# Patient Record
Sex: Female | Born: 1935 | Race: White | Hispanic: No | Marital: Married | Smoking: Never smoker
Health system: Southern US, Community
[De-identification: ages and names within clinical notes are randomized; demographics above are authoritative.]

## PROBLEM LIST (undated history)

## (undated) DIAGNOSIS — I499 Cardiac arrhythmia, unspecified: Secondary | ICD-10-CM

## (undated) DIAGNOSIS — E119 Type 2 diabetes mellitus without complications: Secondary | ICD-10-CM

## (undated) DIAGNOSIS — Z8719 Personal history of other diseases of the digestive system: Secondary | ICD-10-CM

## (undated) DIAGNOSIS — I10 Essential (primary) hypertension: Secondary | ICD-10-CM

## (undated) DIAGNOSIS — J42 Unspecified chronic bronchitis: Secondary | ICD-10-CM

## (undated) DIAGNOSIS — I480 Paroxysmal atrial fibrillation: Secondary | ICD-10-CM

## (undated) DIAGNOSIS — E785 Hyperlipidemia, unspecified: Secondary | ICD-10-CM

## (undated) DIAGNOSIS — K219 Gastro-esophageal reflux disease without esophagitis: Secondary | ICD-10-CM

## (undated) DIAGNOSIS — M810 Age-related osteoporosis without current pathological fracture: Secondary | ICD-10-CM

## (undated) DIAGNOSIS — M1990 Unspecified osteoarthritis, unspecified site: Secondary | ICD-10-CM

## (undated) DIAGNOSIS — N183 Chronic kidney disease, stage 3 unspecified: Secondary | ICD-10-CM

## (undated) DIAGNOSIS — I509 Heart failure, unspecified: Secondary | ICD-10-CM

## (undated) DIAGNOSIS — C4491 Basal cell carcinoma of skin, unspecified: Secondary | ICD-10-CM

## (undated) DIAGNOSIS — T7840XA Allergy, unspecified, initial encounter: Secondary | ICD-10-CM

## (undated) HISTORY — DX: Age-related osteoporosis without current pathological fracture: M81.0

## (undated) HISTORY — PX: ABDOMINAL HYSTERECTOMY: SHX81

## (undated) HISTORY — PX: BASAL CELL CARCINOMA EXCISION: SHX1214

## (undated) HISTORY — PX: APPENDECTOMY: SHX54

## (undated) HISTORY — DX: Gastro-esophageal reflux disease without esophagitis: K21.9

## (undated) HISTORY — DX: Unspecified osteoarthritis, unspecified site: M19.90

## (undated) HISTORY — DX: Hyperlipidemia, unspecified: E78.5

## (undated) HISTORY — DX: Allergy, unspecified, initial encounter: T78.40XA

---

## 1999-02-17 ENCOUNTER — Inpatient Hospital Stay (HOSPITAL_COMMUNITY): Admission: AD | Admit: 1999-02-17 | Discharge: 1999-02-18 | Payer: Self-pay | Admitting: Interventional Cardiology

## 1999-02-18 ENCOUNTER — Encounter: Payer: Self-pay | Admitting: Interventional Cardiology

## 1999-03-09 ENCOUNTER — Ambulatory Visit (HOSPITAL_COMMUNITY): Admission: RE | Admit: 1999-03-09 | Discharge: 1999-03-09 | Payer: Self-pay | Admitting: Gastroenterology

## 1999-04-25 ENCOUNTER — Ambulatory Visit (HOSPITAL_COMMUNITY): Admission: RE | Admit: 1999-04-25 | Discharge: 1999-04-25 | Payer: Self-pay | Admitting: Gastroenterology

## 1999-04-25 ENCOUNTER — Encounter: Payer: Self-pay | Admitting: Gastroenterology

## 2000-07-09 ENCOUNTER — Inpatient Hospital Stay (HOSPITAL_COMMUNITY): Admission: EM | Admit: 2000-07-09 | Discharge: 2000-07-10 | Payer: Self-pay | Admitting: Emergency Medicine

## 2000-07-31 ENCOUNTER — Encounter: Payer: Self-pay | Admitting: *Deleted

## 2000-07-31 ENCOUNTER — Ambulatory Visit (HOSPITAL_COMMUNITY): Admission: RE | Admit: 2000-07-31 | Discharge: 2000-07-31 | Payer: Self-pay | Admitting: *Deleted

## 2001-06-05 ENCOUNTER — Encounter: Admission: RE | Admit: 2001-06-05 | Discharge: 2001-06-05 | Payer: Self-pay | Admitting: Emergency Medicine

## 2001-06-05 ENCOUNTER — Encounter: Payer: Self-pay | Admitting: Emergency Medicine

## 2001-11-25 ENCOUNTER — Encounter: Admission: RE | Admit: 2001-11-25 | Discharge: 2002-02-23 | Payer: Self-pay | Admitting: Family Medicine

## 2012-01-07 DIAGNOSIS — E119 Type 2 diabetes mellitus without complications: Secondary | ICD-10-CM | POA: Diagnosis not present

## 2012-01-11 DIAGNOSIS — D485 Neoplasm of uncertain behavior of skin: Secondary | ICD-10-CM | POA: Diagnosis not present

## 2012-01-11 DIAGNOSIS — D0439 Carcinoma in situ of skin of other parts of face: Secondary | ICD-10-CM | POA: Diagnosis not present

## 2012-02-01 DIAGNOSIS — J309 Allergic rhinitis, unspecified: Secondary | ICD-10-CM | POA: Diagnosis not present

## 2012-02-04 DIAGNOSIS — H01009 Unspecified blepharitis unspecified eye, unspecified eyelid: Secondary | ICD-10-CM | POA: Diagnosis not present

## 2012-02-04 DIAGNOSIS — H2510 Age-related nuclear cataract, unspecified eye: Secondary | ICD-10-CM | POA: Diagnosis not present

## 2012-02-04 DIAGNOSIS — E119 Type 2 diabetes mellitus without complications: Secondary | ICD-10-CM | POA: Diagnosis not present

## 2012-02-06 DIAGNOSIS — D0439 Carcinoma in situ of skin of other parts of face: Secondary | ICD-10-CM | POA: Diagnosis not present

## 2012-02-06 DIAGNOSIS — C44320 Squamous cell carcinoma of skin of unspecified parts of face: Secondary | ICD-10-CM | POA: Diagnosis not present

## 2012-02-26 DIAGNOSIS — N390 Urinary tract infection, site not specified: Secondary | ICD-10-CM | POA: Diagnosis not present

## 2012-02-26 DIAGNOSIS — M25569 Pain in unspecified knee: Secondary | ICD-10-CM | POA: Diagnosis not present

## 2012-03-14 DIAGNOSIS — E782 Mixed hyperlipidemia: Secondary | ICD-10-CM | POA: Diagnosis not present

## 2012-03-14 DIAGNOSIS — E118 Type 2 diabetes mellitus with unspecified complications: Secondary | ICD-10-CM | POA: Diagnosis not present

## 2012-05-06 DIAGNOSIS — E119 Type 2 diabetes mellitus without complications: Secondary | ICD-10-CM | POA: Diagnosis not present

## 2012-05-20 DIAGNOSIS — Z7189 Other specified counseling: Secondary | ICD-10-CM | POA: Diagnosis not present

## 2012-05-20 DIAGNOSIS — Z1331 Encounter for screening for depression: Secondary | ICD-10-CM | POA: Diagnosis not present

## 2012-05-20 DIAGNOSIS — M48062 Spinal stenosis, lumbar region with neurogenic claudication: Secondary | ICD-10-CM | POA: Diagnosis not present

## 2012-06-04 DIAGNOSIS — Z1231 Encounter for screening mammogram for malignant neoplasm of breast: Secondary | ICD-10-CM | POA: Diagnosis not present

## 2012-07-15 DIAGNOSIS — N390 Urinary tract infection, site not specified: Secondary | ICD-10-CM | POA: Diagnosis not present

## 2012-07-15 DIAGNOSIS — A499 Bacterial infection, unspecified: Secondary | ICD-10-CM | POA: Diagnosis not present

## 2012-07-28 DIAGNOSIS — L820 Inflamed seborrheic keratosis: Secondary | ICD-10-CM | POA: Diagnosis not present

## 2012-07-28 DIAGNOSIS — D485 Neoplasm of uncertain behavior of skin: Secondary | ICD-10-CM | POA: Diagnosis not present

## 2012-07-30 DIAGNOSIS — M79609 Pain in unspecified limb: Secondary | ICD-10-CM | POA: Diagnosis not present

## 2012-07-30 DIAGNOSIS — R209 Unspecified disturbances of skin sensation: Secondary | ICD-10-CM | POA: Diagnosis not present

## 2012-08-04 DIAGNOSIS — M5137 Other intervertebral disc degeneration, lumbosacral region: Secondary | ICD-10-CM | POA: Diagnosis not present

## 2012-08-04 DIAGNOSIS — M79609 Pain in unspecified limb: Secondary | ICD-10-CM | POA: Diagnosis not present

## 2012-08-04 DIAGNOSIS — IMO0002 Reserved for concepts with insufficient information to code with codable children: Secondary | ICD-10-CM | POA: Diagnosis not present

## 2012-08-04 DIAGNOSIS — R209 Unspecified disturbances of skin sensation: Secondary | ICD-10-CM | POA: Diagnosis not present

## 2012-08-04 DIAGNOSIS — M948X9 Other specified disorders of cartilage, unspecified sites: Secondary | ICD-10-CM | POA: Diagnosis not present

## 2012-08-06 DIAGNOSIS — E119 Type 2 diabetes mellitus without complications: Secondary | ICD-10-CM | POA: Diagnosis not present

## 2012-08-13 DIAGNOSIS — R930 Abnormal findings on diagnostic imaging of skull and head, not elsewhere classified: Secondary | ICD-10-CM | POA: Diagnosis not present

## 2012-08-13 DIAGNOSIS — M899 Disorder of bone, unspecified: Secondary | ICD-10-CM | POA: Diagnosis not present

## 2012-08-27 DIAGNOSIS — M549 Dorsalgia, unspecified: Secondary | ICD-10-CM | POA: Diagnosis not present

## 2012-08-27 DIAGNOSIS — R9389 Abnormal findings on diagnostic imaging of other specified body structures: Secondary | ICD-10-CM | POA: Diagnosis not present

## 2012-09-02 DIAGNOSIS — R042 Hemoptysis: Secondary | ICD-10-CM | POA: Diagnosis not present

## 2012-09-02 DIAGNOSIS — J0100 Acute maxillary sinusitis, unspecified: Secondary | ICD-10-CM | POA: Diagnosis not present

## 2012-09-03 DIAGNOSIS — R042 Hemoptysis: Secondary | ICD-10-CM | POA: Diagnosis not present

## 2012-09-11 DIAGNOSIS — R930 Abnormal findings on diagnostic imaging of skull and head, not elsewhere classified: Secondary | ICD-10-CM | POA: Diagnosis not present

## 2012-09-15 DIAGNOSIS — M461 Sacroiliitis, not elsewhere classified: Secondary | ICD-10-CM | POA: Diagnosis not present

## 2012-09-15 DIAGNOSIS — M76899 Other specified enthesopathies of unspecified lower limb, excluding foot: Secondary | ICD-10-CM | POA: Diagnosis not present

## 2012-09-15 DIAGNOSIS — IMO0002 Reserved for concepts with insufficient information to code with codable children: Secondary | ICD-10-CM | POA: Diagnosis not present

## 2012-09-18 DIAGNOSIS — M545 Low back pain: Secondary | ICD-10-CM | POA: Diagnosis not present

## 2012-09-18 DIAGNOSIS — R930 Abnormal findings on diagnostic imaging of skull and head, not elsewhere classified: Secondary | ICD-10-CM | POA: Diagnosis not present

## 2012-09-22 DIAGNOSIS — M461 Sacroiliitis, not elsewhere classified: Secondary | ICD-10-CM | POA: Diagnosis not present

## 2012-09-25 DIAGNOSIS — R269 Unspecified abnormalities of gait and mobility: Secondary | ICD-10-CM | POA: Diagnosis not present

## 2012-09-25 DIAGNOSIS — R930 Abnormal findings on diagnostic imaging of skull and head, not elsewhere classified: Secondary | ICD-10-CM | POA: Diagnosis not present

## 2012-09-30 DIAGNOSIS — IMO0002 Reserved for concepts with insufficient information to code with codable children: Secondary | ICD-10-CM | POA: Diagnosis not present

## 2012-09-30 DIAGNOSIS — M545 Low back pain: Secondary | ICD-10-CM | POA: Diagnosis not present

## 2012-09-30 DIAGNOSIS — Z23 Encounter for immunization: Secondary | ICD-10-CM | POA: Diagnosis not present

## 2012-10-02 DIAGNOSIS — IMO0002 Reserved for concepts with insufficient information to code with codable children: Secondary | ICD-10-CM | POA: Diagnosis not present

## 2012-10-02 DIAGNOSIS — M545 Low back pain: Secondary | ICD-10-CM | POA: Diagnosis not present

## 2012-10-09 DIAGNOSIS — IMO0002 Reserved for concepts with insufficient information to code with codable children: Secondary | ICD-10-CM | POA: Diagnosis not present

## 2012-10-09 DIAGNOSIS — M545 Low back pain: Secondary | ICD-10-CM | POA: Diagnosis not present

## 2012-10-10 DIAGNOSIS — IMO0002 Reserved for concepts with insufficient information to code with codable children: Secondary | ICD-10-CM | POA: Diagnosis not present

## 2012-10-10 DIAGNOSIS — M545 Low back pain: Secondary | ICD-10-CM | POA: Diagnosis not present

## 2012-10-15 DIAGNOSIS — IMO0002 Reserved for concepts with insufficient information to code with codable children: Secondary | ICD-10-CM | POA: Diagnosis not present

## 2012-10-15 DIAGNOSIS — M545 Low back pain: Secondary | ICD-10-CM | POA: Diagnosis not present

## 2012-10-23 DIAGNOSIS — M545 Low back pain: Secondary | ICD-10-CM | POA: Diagnosis not present

## 2012-10-23 DIAGNOSIS — IMO0002 Reserved for concepts with insufficient information to code with codable children: Secondary | ICD-10-CM | POA: Diagnosis not present

## 2012-10-24 DIAGNOSIS — M545 Low back pain: Secondary | ICD-10-CM | POA: Diagnosis not present

## 2012-10-24 DIAGNOSIS — IMO0002 Reserved for concepts with insufficient information to code with codable children: Secondary | ICD-10-CM | POA: Diagnosis not present

## 2012-10-28 DIAGNOSIS — IMO0002 Reserved for concepts with insufficient information to code with codable children: Secondary | ICD-10-CM | POA: Diagnosis not present

## 2012-10-28 DIAGNOSIS — E118 Type 2 diabetes mellitus with unspecified complications: Secondary | ICD-10-CM | POA: Diagnosis not present

## 2012-10-28 DIAGNOSIS — M545 Low back pain: Secondary | ICD-10-CM | POA: Diagnosis not present

## 2012-10-28 DIAGNOSIS — E782 Mixed hyperlipidemia: Secondary | ICD-10-CM | POA: Diagnosis not present

## 2012-10-31 DIAGNOSIS — IMO0002 Reserved for concepts with insufficient information to code with codable children: Secondary | ICD-10-CM | POA: Diagnosis not present

## 2012-10-31 DIAGNOSIS — M545 Low back pain: Secondary | ICD-10-CM | POA: Diagnosis not present

## 2012-11-03 DIAGNOSIS — IMO0002 Reserved for concepts with insufficient information to code with codable children: Secondary | ICD-10-CM | POA: Diagnosis not present

## 2012-11-03 DIAGNOSIS — M545 Low back pain: Secondary | ICD-10-CM | POA: Diagnosis not present

## 2012-11-06 DIAGNOSIS — E119 Type 2 diabetes mellitus without complications: Secondary | ICD-10-CM | POA: Diagnosis not present

## 2012-11-06 DIAGNOSIS — I10 Essential (primary) hypertension: Secondary | ICD-10-CM | POA: Diagnosis not present

## 2012-11-06 DIAGNOSIS — E7800 Pure hypercholesterolemia, unspecified: Secondary | ICD-10-CM | POA: Diagnosis not present

## 2012-11-07 DIAGNOSIS — I10 Essential (primary) hypertension: Secondary | ICD-10-CM | POA: Diagnosis not present

## 2012-11-10 DIAGNOSIS — E785 Hyperlipidemia, unspecified: Secondary | ICD-10-CM | POA: Diagnosis not present

## 2012-11-10 DIAGNOSIS — I10 Essential (primary) hypertension: Secondary | ICD-10-CM | POA: Diagnosis not present

## 2012-11-10 DIAGNOSIS — E118 Type 2 diabetes mellitus with unspecified complications: Secondary | ICD-10-CM | POA: Diagnosis not present

## 2012-11-12 DIAGNOSIS — M545 Low back pain: Secondary | ICD-10-CM | POA: Diagnosis not present

## 2012-11-12 DIAGNOSIS — IMO0002 Reserved for concepts with insufficient information to code with codable children: Secondary | ICD-10-CM | POA: Diagnosis not present

## 2012-11-14 DIAGNOSIS — IMO0002 Reserved for concepts with insufficient information to code with codable children: Secondary | ICD-10-CM | POA: Diagnosis not present

## 2012-11-14 DIAGNOSIS — M545 Low back pain: Secondary | ICD-10-CM | POA: Diagnosis not present

## 2012-11-21 DIAGNOSIS — N390 Urinary tract infection, site not specified: Secondary | ICD-10-CM | POA: Diagnosis not present

## 2012-11-21 DIAGNOSIS — I10 Essential (primary) hypertension: Secondary | ICD-10-CM | POA: Diagnosis not present

## 2012-12-03 DIAGNOSIS — I10 Essential (primary) hypertension: Secondary | ICD-10-CM | POA: Diagnosis not present

## 2013-02-09 DIAGNOSIS — E11319 Type 2 diabetes mellitus with unspecified diabetic retinopathy without macular edema: Secondary | ICD-10-CM | POA: Diagnosis not present

## 2013-02-26 DIAGNOSIS — E119 Type 2 diabetes mellitus without complications: Secondary | ICD-10-CM | POA: Diagnosis not present

## 2013-03-09 DIAGNOSIS — F411 Generalized anxiety disorder: Secondary | ICD-10-CM | POA: Diagnosis not present

## 2013-03-09 DIAGNOSIS — K117 Disturbances of salivary secretion: Secondary | ICD-10-CM | POA: Diagnosis not present

## 2013-03-16 DIAGNOSIS — E118 Type 2 diabetes mellitus with unspecified complications: Secondary | ICD-10-CM | POA: Diagnosis not present

## 2013-04-24 DIAGNOSIS — I10 Essential (primary) hypertension: Secondary | ICD-10-CM | POA: Diagnosis not present

## 2013-04-28 DIAGNOSIS — K219 Gastro-esophageal reflux disease without esophagitis: Secondary | ICD-10-CM | POA: Diagnosis not present

## 2013-04-28 DIAGNOSIS — I10 Essential (primary) hypertension: Secondary | ICD-10-CM | POA: Diagnosis not present

## 2013-05-05 DIAGNOSIS — I4891 Unspecified atrial fibrillation: Secondary | ICD-10-CM | POA: Diagnosis not present

## 2013-05-05 DIAGNOSIS — E785 Hyperlipidemia, unspecified: Secondary | ICD-10-CM | POA: Diagnosis not present

## 2013-05-05 DIAGNOSIS — E119 Type 2 diabetes mellitus without complications: Secondary | ICD-10-CM | POA: Diagnosis not present

## 2013-05-05 DIAGNOSIS — I10 Essential (primary) hypertension: Secondary | ICD-10-CM | POA: Diagnosis not present

## 2013-05-05 DIAGNOSIS — R002 Palpitations: Secondary | ICD-10-CM | POA: Diagnosis not present

## 2013-05-06 DIAGNOSIS — I10 Essential (primary) hypertension: Secondary | ICD-10-CM | POA: Diagnosis not present

## 2013-05-06 DIAGNOSIS — I4891 Unspecified atrial fibrillation: Secondary | ICD-10-CM | POA: Diagnosis not present

## 2013-05-07 DIAGNOSIS — I10 Essential (primary) hypertension: Secondary | ICD-10-CM | POA: Diagnosis not present

## 2013-05-07 DIAGNOSIS — E119 Type 2 diabetes mellitus without complications: Secondary | ICD-10-CM | POA: Diagnosis not present

## 2013-05-07 DIAGNOSIS — N189 Chronic kidney disease, unspecified: Secondary | ICD-10-CM | POA: Diagnosis not present

## 2013-05-07 DIAGNOSIS — I4891 Unspecified atrial fibrillation: Secondary | ICD-10-CM | POA: Diagnosis not present

## 2013-05-07 DIAGNOSIS — E785 Hyperlipidemia, unspecified: Secondary | ICD-10-CM | POA: Diagnosis not present

## 2013-05-14 DIAGNOSIS — I4891 Unspecified atrial fibrillation: Secondary | ICD-10-CM | POA: Diagnosis not present

## 2013-05-14 DIAGNOSIS — N281 Cyst of kidney, acquired: Secondary | ICD-10-CM | POA: Diagnosis not present

## 2013-05-14 DIAGNOSIS — I10 Essential (primary) hypertension: Secondary | ICD-10-CM | POA: Diagnosis not present

## 2013-05-18 DIAGNOSIS — I4891 Unspecified atrial fibrillation: Secondary | ICD-10-CM | POA: Diagnosis not present

## 2013-05-18 DIAGNOSIS — I10 Essential (primary) hypertension: Secondary | ICD-10-CM | POA: Diagnosis not present

## 2013-05-18 DIAGNOSIS — N189 Chronic kidney disease, unspecified: Secondary | ICD-10-CM | POA: Diagnosis not present

## 2013-05-18 DIAGNOSIS — E119 Type 2 diabetes mellitus without complications: Secondary | ICD-10-CM | POA: Diagnosis not present

## 2013-05-19 DIAGNOSIS — I4891 Unspecified atrial fibrillation: Secondary | ICD-10-CM | POA: Diagnosis not present

## 2013-05-21 DIAGNOSIS — I4891 Unspecified atrial fibrillation: Secondary | ICD-10-CM | POA: Diagnosis not present

## 2013-05-26 DIAGNOSIS — I4891 Unspecified atrial fibrillation: Secondary | ICD-10-CM | POA: Diagnosis not present

## 2013-06-03 DIAGNOSIS — I10 Essential (primary) hypertension: Secondary | ICD-10-CM | POA: Diagnosis not present

## 2013-06-03 DIAGNOSIS — N189 Chronic kidney disease, unspecified: Secondary | ICD-10-CM | POA: Diagnosis not present

## 2013-06-03 DIAGNOSIS — I4891 Unspecified atrial fibrillation: Secondary | ICD-10-CM | POA: Diagnosis not present

## 2013-06-03 DIAGNOSIS — E785 Hyperlipidemia, unspecified: Secondary | ICD-10-CM | POA: Diagnosis not present

## 2013-06-10 DIAGNOSIS — I4891 Unspecified atrial fibrillation: Secondary | ICD-10-CM | POA: Diagnosis not present

## 2013-06-16 DIAGNOSIS — N189 Chronic kidney disease, unspecified: Secondary | ICD-10-CM | POA: Diagnosis not present

## 2013-06-16 DIAGNOSIS — E119 Type 2 diabetes mellitus without complications: Secondary | ICD-10-CM | POA: Diagnosis not present

## 2013-06-16 DIAGNOSIS — K219 Gastro-esophageal reflux disease without esophagitis: Secondary | ICD-10-CM | POA: Diagnosis not present

## 2013-06-16 DIAGNOSIS — I10 Essential (primary) hypertension: Secondary | ICD-10-CM | POA: Diagnosis not present

## 2013-06-18 DIAGNOSIS — I10 Essential (primary) hypertension: Secondary | ICD-10-CM | POA: Diagnosis not present

## 2013-06-18 DIAGNOSIS — I4891 Unspecified atrial fibrillation: Secondary | ICD-10-CM | POA: Diagnosis not present

## 2013-06-25 DIAGNOSIS — E785 Hyperlipidemia, unspecified: Secondary | ICD-10-CM | POA: Diagnosis not present

## 2013-06-25 DIAGNOSIS — N189 Chronic kidney disease, unspecified: Secondary | ICD-10-CM | POA: Diagnosis not present

## 2013-06-25 DIAGNOSIS — I4891 Unspecified atrial fibrillation: Secondary | ICD-10-CM | POA: Diagnosis not present

## 2013-06-25 DIAGNOSIS — I10 Essential (primary) hypertension: Secondary | ICD-10-CM | POA: Diagnosis not present

## 2013-07-02 DIAGNOSIS — I4891 Unspecified atrial fibrillation: Secondary | ICD-10-CM | POA: Diagnosis not present

## 2013-07-16 DIAGNOSIS — I4891 Unspecified atrial fibrillation: Secondary | ICD-10-CM | POA: Diagnosis not present

## 2013-07-28 DIAGNOSIS — K8020 Calculus of gallbladder without cholecystitis without obstruction: Secondary | ICD-10-CM | POA: Diagnosis not present

## 2013-07-28 DIAGNOSIS — E269 Hyperaldosteronism, unspecified: Secondary | ICD-10-CM | POA: Diagnosis not present

## 2013-07-30 DIAGNOSIS — E119 Type 2 diabetes mellitus without complications: Secondary | ICD-10-CM | POA: Diagnosis not present

## 2013-07-30 DIAGNOSIS — E782 Mixed hyperlipidemia: Secondary | ICD-10-CM | POA: Diagnosis not present

## 2013-07-30 DIAGNOSIS — E118 Type 2 diabetes mellitus with unspecified complications: Secondary | ICD-10-CM | POA: Diagnosis not present

## 2013-08-06 DIAGNOSIS — I4891 Unspecified atrial fibrillation: Secondary | ICD-10-CM | POA: Diagnosis not present

## 2013-08-12 DIAGNOSIS — E785 Hyperlipidemia, unspecified: Secondary | ICD-10-CM | POA: Diagnosis not present

## 2013-08-12 DIAGNOSIS — I10 Essential (primary) hypertension: Secondary | ICD-10-CM | POA: Diagnosis not present

## 2013-08-12 DIAGNOSIS — I4891 Unspecified atrial fibrillation: Secondary | ICD-10-CM | POA: Diagnosis not present

## 2013-08-12 DIAGNOSIS — N189 Chronic kidney disease, unspecified: Secondary | ICD-10-CM | POA: Diagnosis not present

## 2013-08-17 DIAGNOSIS — E7800 Pure hypercholesterolemia, unspecified: Secondary | ICD-10-CM | POA: Diagnosis not present

## 2013-08-17 DIAGNOSIS — E119 Type 2 diabetes mellitus without complications: Secondary | ICD-10-CM | POA: Diagnosis not present

## 2013-08-17 DIAGNOSIS — I10 Essential (primary) hypertension: Secondary | ICD-10-CM | POA: Diagnosis not present

## 2013-08-17 DIAGNOSIS — N189 Chronic kidney disease, unspecified: Secondary | ICD-10-CM | POA: Diagnosis not present

## 2013-08-18 DIAGNOSIS — N281 Cyst of kidney, acquired: Secondary | ICD-10-CM | POA: Diagnosis not present

## 2013-08-27 DIAGNOSIS — I4891 Unspecified atrial fibrillation: Secondary | ICD-10-CM | POA: Diagnosis not present

## 2013-09-07 DIAGNOSIS — I4891 Unspecified atrial fibrillation: Secondary | ICD-10-CM | POA: Diagnosis not present

## 2013-09-17 DIAGNOSIS — I10 Essential (primary) hypertension: Secondary | ICD-10-CM | POA: Diagnosis not present

## 2013-09-17 DIAGNOSIS — E782 Mixed hyperlipidemia: Secondary | ICD-10-CM | POA: Diagnosis not present

## 2013-09-17 DIAGNOSIS — E118 Type 2 diabetes mellitus with unspecified complications: Secondary | ICD-10-CM | POA: Diagnosis not present

## 2013-09-17 DIAGNOSIS — E7800 Pure hypercholesterolemia, unspecified: Secondary | ICD-10-CM | POA: Diagnosis not present

## 2013-09-17 DIAGNOSIS — N189 Chronic kidney disease, unspecified: Secondary | ICD-10-CM | POA: Diagnosis not present

## 2013-09-17 DIAGNOSIS — K219 Gastro-esophageal reflux disease without esophagitis: Secondary | ICD-10-CM | POA: Diagnosis not present

## 2013-09-17 DIAGNOSIS — E119 Type 2 diabetes mellitus without complications: Secondary | ICD-10-CM | POA: Diagnosis not present

## 2013-09-23 DIAGNOSIS — I10 Essential (primary) hypertension: Secondary | ICD-10-CM | POA: Diagnosis not present

## 2013-09-23 DIAGNOSIS — E785 Hyperlipidemia, unspecified: Secondary | ICD-10-CM | POA: Diagnosis not present

## 2013-09-23 DIAGNOSIS — N189 Chronic kidney disease, unspecified: Secondary | ICD-10-CM | POA: Diagnosis not present

## 2013-09-23 DIAGNOSIS — I4891 Unspecified atrial fibrillation: Secondary | ICD-10-CM | POA: Diagnosis not present

## 2013-09-29 DIAGNOSIS — N179 Acute kidney failure, unspecified: Secondary | ICD-10-CM | POA: Diagnosis not present

## 2013-09-29 DIAGNOSIS — I129 Hypertensive chronic kidney disease with stage 1 through stage 4 chronic kidney disease, or unspecified chronic kidney disease: Secondary | ICD-10-CM | POA: Diagnosis not present

## 2013-09-30 DIAGNOSIS — Z23 Encounter for immunization: Secondary | ICD-10-CM | POA: Diagnosis not present

## 2013-09-30 DIAGNOSIS — D0460 Carcinoma in situ of skin of unspecified upper limb, including shoulder: Secondary | ICD-10-CM | POA: Diagnosis not present

## 2013-09-30 DIAGNOSIS — D485 Neoplasm of uncertain behavior of skin: Secondary | ICD-10-CM | POA: Diagnosis not present

## 2013-10-05 DIAGNOSIS — I4891 Unspecified atrial fibrillation: Secondary | ICD-10-CM | POA: Diagnosis not present

## 2013-10-13 DIAGNOSIS — N189 Chronic kidney disease, unspecified: Secondary | ICD-10-CM | POA: Diagnosis not present

## 2013-10-13 DIAGNOSIS — N178 Other acute kidney failure: Secondary | ICD-10-CM | POA: Diagnosis not present

## 2013-10-13 DIAGNOSIS — I10 Essential (primary) hypertension: Secondary | ICD-10-CM | POA: Diagnosis not present

## 2013-10-27 DIAGNOSIS — I4891 Unspecified atrial fibrillation: Secondary | ICD-10-CM | POA: Diagnosis not present

## 2013-10-27 DIAGNOSIS — I10 Essential (primary) hypertension: Secondary | ICD-10-CM | POA: Diagnosis not present

## 2013-10-27 DIAGNOSIS — E785 Hyperlipidemia, unspecified: Secondary | ICD-10-CM | POA: Diagnosis not present

## 2013-10-27 DIAGNOSIS — N189 Chronic kidney disease, unspecified: Secondary | ICD-10-CM | POA: Diagnosis not present

## 2013-10-30 ENCOUNTER — Telehealth: Payer: Self-pay

## 2013-10-30 DIAGNOSIS — I4891 Unspecified atrial fibrillation: Secondary | ICD-10-CM | POA: Diagnosis not present

## 2013-10-30 NOTE — Telephone Encounter (Signed)
received progress notes for pt form [facility].pt is does not have a appt scgeduled with a cardiologist in this office. called the tel # listed for pt and was told I have the wrong number. will turn records into our medical records office

## 2013-11-03 ENCOUNTER — Telehealth: Payer: Self-pay

## 2013-11-03 NOTE — Telephone Encounter (Signed)
pt made aware appt with Dr.Smith and the coumadin clinic sch for 11/18/13. pt made aware of date and time. pt verbalized understanding.

## 2013-11-18 ENCOUNTER — Encounter: Payer: Self-pay | Admitting: Interventional Cardiology

## 2013-11-18 ENCOUNTER — Ambulatory Visit (INDEPENDENT_AMBULATORY_CARE_PROVIDER_SITE_OTHER): Payer: Medicare Other | Admitting: *Deleted

## 2013-11-18 ENCOUNTER — Ambulatory Visit (INDEPENDENT_AMBULATORY_CARE_PROVIDER_SITE_OTHER): Payer: Medicare Other | Admitting: Interventional Cardiology

## 2013-11-18 VITALS — BP 142/69 | HR 69 | Ht 64.0 in | Wt 155.0 lb

## 2013-11-18 DIAGNOSIS — I10 Essential (primary) hypertension: Secondary | ICD-10-CM

## 2013-11-18 DIAGNOSIS — I4891 Unspecified atrial fibrillation: Secondary | ICD-10-CM | POA: Diagnosis not present

## 2013-11-18 DIAGNOSIS — IMO0002 Reserved for concepts with insufficient information to code with codable children: Secondary | ICD-10-CM

## 2013-11-18 DIAGNOSIS — E1165 Type 2 diabetes mellitus with hyperglycemia: Secondary | ICD-10-CM | POA: Diagnosis not present

## 2013-11-18 DIAGNOSIS — E785 Hyperlipidemia, unspecified: Secondary | ICD-10-CM

## 2013-11-18 DIAGNOSIS — E269 Hyperaldosteronism, unspecified: Secondary | ICD-10-CM

## 2013-11-18 DIAGNOSIS — I5032 Chronic diastolic (congestive) heart failure: Secondary | ICD-10-CM

## 2013-11-18 DIAGNOSIS — I480 Paroxysmal atrial fibrillation: Secondary | ICD-10-CM | POA: Insufficient documentation

## 2013-11-18 LAB — POCT INR: INR: 2.9

## 2013-11-18 NOTE — Progress Notes (Signed)
Patient ID: Ariel Avila, female   DOB: 05-10-1930, 79 y.o.   MRN: [id number]   Date: 11/18/2013 ID: Ariel Avila, DOB 1930-07-31, MRN [id number] PCP: No primary provider on file.  Reason: Establish for a chronic longitudinal care  ASSESSMENT;  1. Paroxysmal atrial fibrillation 2. Chronic diastolic heart failure 3. Hypertension, borderline control 4. Diabetes mellitus 5. Chronic kidney disease, stage III-IV 6. Hyperaldosteronism   PLAN:  1. Followup in one year or earlier if palpitations. 2. No change in medical regimen   SUBJECTIVE: Ariel Avila is a 79 y.o. female who is 4 and relocated to [place] from [place] over the past 6 months. I previously cared for her greater than 10 years ago. She has a history of paroxysmal atrial fibrillation. She has normal LV systolic function by a recent echo with an EF in the 50-55% range. She denies any current symptoms. Coronary angiography in 2002 demonstrated normal coronaries. There is no orthopnea, PND, ankle edema, or exertional intolerance. She is on chronic Coumadin therapy because of an elevated CHADS score in the setting of PAF.   Allergies  Allergen Reactions  . Levaquin [Levofloxacin]   . Sulfa Antibiotics     No current outpatient prescriptions on file prior to visit.   No current facility-administered medications on file prior to visit.    Past Medical History  Diagnosis Date  . H/O: hysterectomy     Past Surgical History  Procedure Laterality Date  . Appendectomy      History   Social History  . Marital Status: Married    Spouse Name: N/A    Number of Children: N/A  . Years of Education: N/A   Occupational History  . Not on file.   Social History Main Topics  . Smoking status: Never Smoker   . Smokeless tobacco: Not on file  . Alcohol Use: No  . Drug Use: No  . Sexual Activity: Not on file   Other Topics Concern  . Not on file   Social History Narrative  . No narrative on file    Family  History  Problem Relation Age of Onset  . Tuberculosis Mother   . Heart disease Father     ROS: Denies claudication. No episodes of syncope or recent palpitations. Last episode of atrial fib was May 2014. She denies transient neurological symptoms. No peripheral edema. Denies orthopnea. No abdominal swelling. No bleeding on Coumadin.. Other systems negative for complaints.  OBJECTIVE: BP 142/69  Pulse 69  Ht 5\' 4"  (1.626 m)  Wt 155 lb (70.308 kg)  BMI 26.59 kg/m2,  General: No acute distress, elderly, well-nourished HEENT: normal with the exception of several missing teeth Neck: JVD no JVD. Carotids absent bruits Chest: Clear Cardiac: Murmur: 1-2 of 6 systolic murmur left lower sternal border. Gallop: S4. Rhythm: Regular. Other: Normal Abdomen: Bruit: Absent. Pulsation: Absent Extremities: Edema: Absent. Pulses: 1-2+ bilateral Neuro: Normal Psych: Normal  ECG: Normal sinus rhythm otherwise normal

## 2013-11-18 NOTE — Patient Instructions (Signed)
Your physician recommends that you continue on your current medications as directed. Please refer to the Current Medication list given to you today.  Your physician wants you to follow-up in: 1 year. You will receive a reminder letter in the mail two months in advance. If you don't receive a letter, please call our office to schedule the follow-up appointment.  

## 2013-12-21 ENCOUNTER — Emergency Department (HOSPITAL_COMMUNITY): Payer: Medicare Other

## 2013-12-21 ENCOUNTER — Emergency Department (HOSPITAL_COMMUNITY)
Admission: EM | Admit: 2013-12-21 | Discharge: 2013-12-21 | Disposition: A | Discharge status: Home / Self Care | Payer: Medicare Other | Attending: Emergency Medicine | Admitting: Emergency Medicine

## 2013-12-21 ENCOUNTER — Encounter (HOSPITAL_COMMUNITY): Payer: Self-pay | Admitting: Emergency Medicine

## 2013-12-21 DIAGNOSIS — I10 Essential (primary) hypertension: Secondary | ICD-10-CM | POA: Diagnosis not present

## 2013-12-21 DIAGNOSIS — Z7901 Long term (current) use of anticoagulants: Secondary | ICD-10-CM | POA: Diagnosis not present

## 2013-12-21 DIAGNOSIS — Z79899 Other long term (current) drug therapy: Secondary | ICD-10-CM | POA: Diagnosis not present

## 2013-12-21 DIAGNOSIS — M25571 Pain in right ankle and joints of right foot: Secondary | ICD-10-CM

## 2013-12-21 DIAGNOSIS — Z90710 Acquired absence of both cervix and uterus: Secondary | ICD-10-CM | POA: Insufficient documentation

## 2013-12-21 DIAGNOSIS — M25579 Pain in unspecified ankle and joints of unspecified foot: Secondary | ICD-10-CM | POA: Diagnosis not present

## 2013-12-21 DIAGNOSIS — Y9289 Other specified places as the place of occurrence of the external cause: Secondary | ICD-10-CM | POA: Insufficient documentation

## 2013-12-21 DIAGNOSIS — X58XXXA Exposure to other specified factors, initial encounter: Secondary | ICD-10-CM | POA: Insufficient documentation

## 2013-12-21 DIAGNOSIS — E119 Type 2 diabetes mellitus without complications: Secondary | ICD-10-CM | POA: Insufficient documentation

## 2013-12-21 DIAGNOSIS — M7730 Calcaneal spur, unspecified foot: Secondary | ICD-10-CM | POA: Diagnosis not present

## 2013-12-21 DIAGNOSIS — S8990XA Unspecified injury of unspecified lower leg, initial encounter: Secondary | ICD-10-CM | POA: Diagnosis not present

## 2013-12-21 DIAGNOSIS — Y9389 Activity, other specified: Secondary | ICD-10-CM | POA: Insufficient documentation

## 2013-12-21 DIAGNOSIS — S93609A Unspecified sprain of unspecified foot, initial encounter: Secondary | ICD-10-CM | POA: Diagnosis not present

## 2013-12-21 HISTORY — DX: Essential (primary) hypertension: I10

## 2013-12-21 MED ORDER — ACETAMINOPHEN 325 MG PO TABS
650.0000 mg | ORAL_TABLET | Freq: Once | ORAL | Status: AC
  Administered 2013-12-21: 650 mg via ORAL
  Filled 2013-12-21: qty 2

## 2013-12-21 NOTE — ED Notes (Signed)
Pt reports that she was at the MD office and attempted to step off the elevator and started having pain in the right foot. States that she has severe pain if she steps on it.

## 2013-12-21 NOTE — ED Provider Notes (Signed)
CSN: [id number]     Arrival date & time 12/21/13  1038 History   First MD Initiated Contact with Patient 12/21/13 1045     Chief Complaint  Patient presents with  . Foot Pain   (Consider location/radiation/quality/duration/timing/severity/associated sxs/prior Treatment) Patient is a 79 y.o. female presenting with lower extremity pain. The history is provided by the patient. No language interpreter was used.  Foot Pain This is a new problem. The current episode started today. Pertinent negatives include no abdominal pain, chills, congestion, coughing, fatigue, fever, joint swelling, nausea, neck pain, rash, vomiting or weakness.   Pt is a well-appearing, 79 year old female who presents with right foot pain after stepping off the elevator at her MD's office. She doesn't think she injured her foot. No history of twisting her ankle or falling. She reports that she has recently had cold symptoms that are resolving, no fever or associated chills. She reports that she is feeling fine today except that her foot is hurting.   Past Medical History  Diagnosis Date  . H/O: hysterectomy   . Hypertension   . Diabetes mellitus without complication     Type II   Past Surgical History  Procedure Laterality Date  . Appendectomy     Family History  Problem Relation Age of Onset  . Tuberculosis Mother   . Heart disease Father    History  Substance Use Topics  . Smoking status: Never Smoker   . Smokeless tobacco: Not on file  . Alcohol Use: No   OB History   Grav Para Term Preterm Abortions TAB SAB Ect Mult Living                 Review of Systems  Constitutional: Negative for fever, chills and fatigue.  HENT: Negative for congestion.   Respiratory: Negative for cough and shortness of breath.   Gastrointestinal: Negative for nausea, vomiting and abdominal pain.  Musculoskeletal: Negative for back pain, joint swelling and neck pain.  Skin: Negative for rash.  Neurological: Negative for  weakness.  All other systems reviewed and are negative.    Allergies  Levaquin and Sulfa antibiotics  Home Medications   Current Outpatient Rx  Name  Route  Sig  Dispense  Refill  . carvedilol (COREG) 12.5 MG tablet   Oral   Take 12.5 mg by mouth 2 (two) times daily with a meal.         . cloNIDine (CATAPRES) 0.1 MG tablet   Oral   Take 0.1 mg by mouth 2 (two) times daily.         Marland Kitchen diltiazem (CARDIZEM) 60 MG tablet   Oral   Take 60 mg by mouth 2 (two) times daily.         . furosemide (LASIX) 20 MG tablet   Oral   Take 20 mg by mouth daily.         . hydrALAZINE (APRESOLINE) 50 MG tablet   Oral   Take 50 mg by mouth as directed. 2 TABLETS TWICE DAILY         . lisinopril (PRINIVIL,ZESTRIL) 10 MG tablet   Oral   Take 10 mg by mouth 2 (two) times daily.         . pantoprazole (PROTONIX) 40 MG tablet   Oral   Take 40 mg by mouth daily.         Marland Kitchen spironolactone (ALDACTONE) 25 MG tablet   Oral   Take 25 mg by mouth as directed.  TAKE Monday Wednesday AND Friday AS DIRECTED         . warfarin (COUMADIN) 2 MG tablet   Oral   Take 2 mg by mouth as directed.          BP 228/88  Pulse 73  Temp(Src) 97.7 F (36.5 C) (Oral)  Resp 18  Ht 5\' 4"  (1.626 m)  Wt 155 lb (70.308 kg)  BMI 26.59 kg/m2  SpO2 100% Physical Exam  Nursing note and vitals reviewed. Constitutional: She is oriented to person, place, and time. She appears well-developed and well-nourished. No distress.  HENT:  Head: Normocephalic and atraumatic.  Mouth/Throat: Oropharynx is clear and moist.  Eyes: Conjunctivae and EOM are normal.  Neck: Normal range of motion. Neck supple. No JVD present. No tracheal deviation present. No thyromegaly present.  Cardiovascular: Normal rate, regular rhythm and normal heart sounds.   Pulmonary/Chest: Effort normal and breath sounds normal. No respiratory distress. She has no wheezes.  Abdominal: Soft. Bowel sounds are normal. She exhibits no  distension. There is no tenderness.  Musculoskeletal:       Right ankle: She exhibits decreased range of motion. She exhibits normal pulse. Tenderness. Achilles tendon normal.       Feet:  Lymphadenopathy:    She has no cervical adenopathy.  Neurological: She is alert and oriented to person, place, and time.  Skin: Skin is warm and dry.  Psychiatric: She has a normal mood and affect. Her behavior is normal. Judgment and thought content normal.    ED Course  Procedures (including critical care time) Labs Review Labs Reviewed - No data to display Imaging Review No results found.  EKG Interpretation   None       MDM   1. Pain, joint, ankle and foot, right     X-ray of right ankle, foot; No acute fx or subluxation. Calcanear plantar spur and bunion. Mild tenderness to palpation on exam. No numbness or tingling. Good sensation, strength and distal pulses. Ortho shoe provided to wear as needed for comfort. RICE instructions. Follow-up with Ortho MD as needed. Could have occult stress fx.      Irish Elders, NP 12/29/13 2340

## 2013-12-21 NOTE — ED Notes (Signed)
Called ortho for cam walker

## 2013-12-21 NOTE — Progress Notes (Signed)
Orthopedic Tech Progress Note Patient Details:  Ariel Avila 1930-02-15 [id number]  Ortho Devices Type of Ortho Device: CAM walker Ortho Device/Splint Interventions: Application   Shawnie Pons 12/21/2013, 12:52 PM

## 2013-12-21 NOTE — ED Notes (Signed)
Pt taken to the bathroom via wheelchair. No difficulties, reports pain when weight applied to her foot.

## 2013-12-30 ENCOUNTER — Other Ambulatory Visit: Payer: Self-pay

## 2013-12-30 ENCOUNTER — Telehealth: Payer: Self-pay | Admitting: Interventional Cardiology

## 2013-12-30 NOTE — Telephone Encounter (Signed)
New message   Pt needs lovastatin sent to [facility]

## 2013-12-30 NOTE — Telephone Encounter (Signed)
lmom for pt to return call. lovastatin was not listed on pt med list at last office visit.pt recently establish care with Dr.Smith after relocating back to the area.pt did not say what dosage of lovastatin? also Dr.Smith is not in the office to authorize.

## 2014-01-04 NOTE — ED Provider Notes (Signed)
Medical screening examination/treatment/procedure(s) were conducted as a shared visit with non-physician practitioner(s) and myself.  I personally evaluated the patient during the encounter.  EKG Interpretation   None       Pt with foot pain following possible twisting injury.  Pt is tender along delta ligament of ankle, no tenderness above malleolus, no tenderness at 5th metatarsal head.  Plain films done, no acute fracture, likely ligament strain.    Saddie Benders. Hance Caspers, MD 01/04/14 1535

## 2014-01-05 MED ORDER — LOVASTATIN 40 MG PO TABS: 40.0000 mg | ORAL_TABLET | Freq: Every day | ORAL | Status: AC

## 2014-01-15 DIAGNOSIS — E119 Type 2 diabetes mellitus without complications: Secondary | ICD-10-CM | POA: Diagnosis not present

## 2014-01-15 DIAGNOSIS — I10 Essential (primary) hypertension: Secondary | ICD-10-CM | POA: Diagnosis not present

## 2014-01-15 DIAGNOSIS — E785 Hyperlipidemia, unspecified: Secondary | ICD-10-CM | POA: Diagnosis not present

## 2014-01-20 DIAGNOSIS — D631 Anemia in chronic kidney disease: Secondary | ICD-10-CM | POA: Diagnosis not present

## 2014-01-20 DIAGNOSIS — N183 Chronic kidney disease, stage 3 unspecified: Secondary | ICD-10-CM | POA: Diagnosis not present

## 2014-01-20 DIAGNOSIS — N2581 Secondary hyperparathyroidism of renal origin: Secondary | ICD-10-CM | POA: Diagnosis not present

## 2014-03-09 DIAGNOSIS — N2581 Secondary hyperparathyroidism of renal origin: Secondary | ICD-10-CM | POA: Diagnosis not present

## 2014-03-09 DIAGNOSIS — N183 Chronic kidney disease, stage 3 unspecified: Secondary | ICD-10-CM | POA: Diagnosis not present

## 2014-03-09 DIAGNOSIS — N039 Chronic nephritic syndrome with unspecified morphologic changes: Secondary | ICD-10-CM | POA: Diagnosis not present

## 2014-03-09 DIAGNOSIS — D631 Anemia in chronic kidney disease: Secondary | ICD-10-CM | POA: Diagnosis not present

## 2014-03-26 ENCOUNTER — Telehealth: Payer: Self-pay | Admitting: Interventional Cardiology

## 2014-03-26 MED ORDER — CLONIDINE HCL 0.1 MG PO TABS: 0.1000 mg | ORAL_TABLET | Freq: Every day | ORAL | Status: DC

## 2014-03-26 MED ORDER — LABETALOL HCL 200 MG PO TABS: 200.0000 mg | ORAL_TABLET | Freq: Two times a day (BID) | ORAL | Status: DC

## 2014-03-26 NOTE — Telephone Encounter (Signed)
New message    Patient calling went into Afib last night. Should medication be adjusted.

## 2014-03-26 NOTE — Telephone Encounter (Signed)
returned pt call.lmom  for pt to call back 

## 2014-03-26 NOTE — Telephone Encounter (Signed)
Follow up  ° ° °Patient calling back to speak with nurse  °

## 2014-03-26 NOTE — Telephone Encounter (Signed)
returned pt call pt sts that she went into afib yesterday around 5pm and converted back to nsr around 1am.pt sts that her heartrate did not go over 99bpm.pt sts that she is doing ok today no symptom, pt denies chest pain, palpitations.pt sts that her medications were changed by her nephrologist Dr.Webb. Carvedilol was d/c pt started on labetalol 200mg  bid. Aldactone was d/c. Clonidine was reduced to 0.1 mg daily. Adv pt that Dr.Smith does not want to make any med changes at this time since she is back in nsr and doing ok. Adv her I would fwd Dr.Smtih a message to give him an update on her medication changes. Pt adv to call the office if she goes back into afib,does not covert back to nsr, if her heart is racing or if she's having palpitations/heartrate over 110bpm.pt agreeable with plan and verbalized understanding

## 2014-03-26 NOTE — Telephone Encounter (Signed)
No change or action needed if back in rhythm and episodes don't recur.

## 2014-04-04 ENCOUNTER — Ambulatory Visit (INDEPENDENT_AMBULATORY_CARE_PROVIDER_SITE_OTHER): Payer: Medicare Other | Admitting: Internal Medicine

## 2014-04-04 ENCOUNTER — Ambulatory Visit: Payer: Medicare Other

## 2014-04-04 VITALS — BP 134/68 | HR 75 | Temp 98.0°F | Resp 16 | Ht 63.5 in | Wt 154.0 lb

## 2014-04-04 DIAGNOSIS — R059 Cough, unspecified: Secondary | ICD-10-CM

## 2014-04-04 DIAGNOSIS — R0602 Shortness of breath: Secondary | ICD-10-CM

## 2014-04-04 DIAGNOSIS — R05 Cough: Secondary | ICD-10-CM

## 2014-04-04 MED ORDER — HYDROCODONE-HOMATROPINE 5-1.5 MG/5ML PO SYRP: 5.0000 mL | ORAL_SOLUTION | Freq: Four times a day (QID) | ORAL | Status: DC | PRN

## 2014-04-04 MED ORDER — AMOXICILLIN-POT CLAVULANATE 875-125 MG PO TABS: 1.0000 | ORAL_TABLET | Freq: Two times a day (BID) | ORAL | Status: DC

## 2014-04-04 NOTE — Progress Notes (Addendum)
Subjective:    Patient ID: Ariel Avila, female    DOB: 1930-10-19, 80 y.o.   MRN: [id number]  Cough Associated symptoms include shortness of breath. Pertinent negatives include no fever or wheezing.  Shortness of Breath Pertinent negatives include no fever, leg swelling or wheezing.   Chief Complaint  Patient presents with  . Cough    hurting in middle of chest due cough x5 days has gotten worse   . Shortness of Breath   This chart was scribed for Tami Lin, MD by Thea Alken, ED Scribe. This patient was seen in room 10 and the patient's care was started at 12:25 PM.  HPI Comments: Ariel Avila is a 80 y.o. female with h/o HTN who presents to the [facility] complaining of cough and SOB onset 5 days ago.  Pt reports that at first she thought she was having sinus pressure. [place] She states that she has SOB with walking Pt reports associated nasal congestion and pressure at maxillary sinuses. Pt denies fever, leg swelling. Pt denies trouble with wheezing in the past. Pt denies CHF. Pt reports that her pulse usually runs 67-72. Pt reports recent afib but denies afib at the moment. Pt denies any other medical issues today.   Past Medical History  Diagnosis Date  . H/O: hysterectomy   . Hypertension   . Diabetes mellitus without complication     Type II  . Allergy   . Arthritis   . GERD (gastroesophageal reflux disease)   . Hyperlipidemia   . Chronic kidney disease   . Osteoporosis    Allergies  Allergen Reactions  . Levaquin [Levofloxacin]   . Sulfa Antibiotics    Prior to Admission medications   Medication Sig Start Date End Date Taking? Authorizing Provider  carvedilol (COREG) 12.5 MG tablet Take 12.5 mg by mouth 2 (two) times daily with a meal.   Yes Historical Provider, MD  cloNIDine (CATAPRES) 0.1 MG tablet Take 1 tablet (0.1 mg total) by mouth daily. 03/26/14  Yes Belva Crome III, MD  diltiazem  (CARDIZEM) 60 MG tablet Take 60 mg by mouth 2 (two) times daily.   Yes Historical Provider, MD  ferrous sulfate 325 (65 FE) MG tablet Take 325 mg by mouth daily with breakfast.   Yes Historical Provider, MD  fluticasone (FLONASE) 50 MCG/ACT nasal spray Place into both nostrils daily.   Yes Historical Provider, MD  furosemide (LASIX) 20 MG tablet Take 20 mg by mouth daily.   Yes Historical Provider, MD  glipiZIDE (GLUCOTROL) 10 MG tablet Take 10 mg by mouth daily before breakfast.   Yes Historical Provider, MD  hydrALAZINE (APRESOLINE) 50 MG tablet Take 50 mg by mouth as directed. 2 TABLETS TWICE DAILY   Yes Historical Provider, MD  Insulin Detemir (LEVEMIR FLEXPEN) 100 UNIT/ML Pen Inject 10 Units into the skin daily at 10 pm.   Yes Historical Provider, MD  lisinopril (PRINIVIL,ZESTRIL) 10 MG tablet Take 10 mg by mouth 2 (two) times daily.   Yes Historical Provider, MD  lovastatin (MEVACOR) 40 MG tablet Take 1 tablet (40 mg total) by mouth at bedtime. 01/05/14  Yes Sinclair Grooms, MD  Multiple Vitamin (MULTIVITAMIN WITH MINERALS) TABS tablet Take 1 tablet by mouth daily.   Yes Historical Provider, MD  Vitamin D, Cholecalciferol, 1000 UNITS TABS Take by mouth.   Yes Historical Provider, MD  labetalol (NORMODYNE) 200 MG tablet Take 1  tablet (200 mg total) by mouth 2 (two) times daily. 03/26/14   Belva Crome III, MD  pantoprazole (PROTONIX) 40 MG tablet Take 40 mg by mouth daily.    Historical Provider, MD  warfarin (COUMADIN) 2 MG tablet Take 5 mg by mouth daily.     Historical Provider, MD    Review of Systems  Constitutional: Negative for fever.  HENT: Positive for sinus pressure.   Respiratory: Positive for cough and shortness of breath. Negative for wheezing.   Cardiovascular: Negative for leg swelling.      Objective:   Physical Exam  Nursing note and vitals reviewed. Constitutional: She is oriented to person, place, and time. She appears well-developed and well-nourished. No distress.    HENT:  Head: Normocephalic and atraumatic.  Nares clear  Eyes: EOM are normal.  Neck: Neck supple. No tracheal deviation present.  Cardiovascular: Normal rate and regular rhythm.   No murmur heard. Rate 65  Pulmonary/Chest: Effort normal. No respiratory distress. She has wheezes (anteriorly and billaterally  ).  Musculoskeletal: Normal range of motion.  No peripheral edema  Lymphadenopathy:    She has no cervical adenopathy.  Neurological: She is alert and oriented to person, place, and time.  Skin: Skin is warm and dry.  Psychiatric: She has a normal mood and affect. Her behavior is normal.   Filed Vitals:   04/04/14 1151  BP: 134/68  Pulse: 75  Temp: 98 F (36.7 C)  Resp: 16  [facility] reading (PRIMARY) by  Dr. Amante Fomby=lingular infiltrate      Assessment & Plan:  I have completed the patient encounter in its entirety as documented by the scribe, with editing by me where necessary. Burton Gahan P. Laney Pastor, M.D.  CAP?/Sinus congestion 2 AR/Bronchospasm mild  Meds ordered this encounter  Medications  . amoxicillin-clavulanate (AUGMENTIN) 875-125 MG per tablet    Sig: Take 1 tablet by mouth 2 (two) times daily.    Dispense:  20 tablet    Refill:  0  . HYDROcodone-homatropine (HYCODAN) 5-1.5 MG/5ML syrup    Sig: Take 5 mLs by mouth every 6 (six) hours as needed for cough.    Dispense:  120 mL    Refill:  0  Hx of untoward rx to inhalers given for cough in past On clarinex and flonase With DM-no prednisone Recent change in blood pressure medicine including discontinuing spironolactone could allow an early exacerbation of congestive failure but this is not obvious on the x-ray or by exam I recommended followup with her PCP Dr. Rex Kras on Tuesday or Wednesday

## 2014-04-07 DIAGNOSIS — Z79899 Other long term (current) drug therapy: Secondary | ICD-10-CM | POA: Diagnosis not present

## 2014-04-07 DIAGNOSIS — I4891 Unspecified atrial fibrillation: Secondary | ICD-10-CM | POA: Diagnosis not present

## 2014-04-07 DIAGNOSIS — J45909 Unspecified asthma, uncomplicated: Secondary | ICD-10-CM | POA: Diagnosis not present

## 2014-04-12 ENCOUNTER — Ambulatory Visit (INDEPENDENT_AMBULATORY_CARE_PROVIDER_SITE_OTHER): Payer: Medicare Other | Admitting: *Deleted

## 2014-04-12 DIAGNOSIS — Z5181 Encounter for therapeutic drug level monitoring: Secondary | ICD-10-CM | POA: Diagnosis not present

## 2014-04-12 DIAGNOSIS — I4891 Unspecified atrial fibrillation: Secondary | ICD-10-CM | POA: Diagnosis not present

## 2014-04-12 LAB — POCT INR: INR: 4

## 2014-04-20 ENCOUNTER — Ambulatory Visit (INDEPENDENT_AMBULATORY_CARE_PROVIDER_SITE_OTHER): Payer: Medicare Other | Admitting: *Deleted

## 2014-04-20 DIAGNOSIS — Z5181 Encounter for therapeutic drug level monitoring: Secondary | ICD-10-CM

## 2014-04-20 DIAGNOSIS — I4891 Unspecified atrial fibrillation: Secondary | ICD-10-CM

## 2014-04-20 LAB — POCT INR: INR: 2.8

## 2014-05-18 ENCOUNTER — Ambulatory Visit (INDEPENDENT_AMBULATORY_CARE_PROVIDER_SITE_OTHER): Payer: Medicare Other | Admitting: Pharmacist

## 2014-05-18 DIAGNOSIS — N183 Chronic kidney disease, stage 3 unspecified: Secondary | ICD-10-CM | POA: Diagnosis not present

## 2014-05-18 DIAGNOSIS — I4891 Unspecified atrial fibrillation: Secondary | ICD-10-CM | POA: Diagnosis not present

## 2014-05-18 DIAGNOSIS — D631 Anemia in chronic kidney disease: Secondary | ICD-10-CM | POA: Diagnosis not present

## 2014-05-18 DIAGNOSIS — Z5181 Encounter for therapeutic drug level monitoring: Secondary | ICD-10-CM | POA: Diagnosis not present

## 2014-05-18 DIAGNOSIS — N390 Urinary tract infection, site not specified: Secondary | ICD-10-CM | POA: Diagnosis not present

## 2014-05-18 DIAGNOSIS — N2581 Secondary hyperparathyroidism of renal origin: Secondary | ICD-10-CM | POA: Diagnosis not present

## 2014-05-18 DIAGNOSIS — N039 Chronic nephritic syndrome with unspecified morphologic changes: Secondary | ICD-10-CM | POA: Diagnosis not present

## 2014-05-18 LAB — POCT INR: INR: 2.4

## 2014-06-23 ENCOUNTER — Emergency Department (HOSPITAL_COMMUNITY): Payer: Medicare Other

## 2014-06-23 ENCOUNTER — Inpatient Hospital Stay (HOSPITAL_COMMUNITY)
Admission: EM | Admit: 2014-06-23 | Discharge: 2014-06-24 | DRG: 202 | Disposition: A | Discharge status: Home / Self Care | Payer: Medicare Other | Attending: Internal Medicine | Admitting: Internal Medicine

## 2014-06-23 ENCOUNTER — Encounter (HOSPITAL_COMMUNITY): Payer: Self-pay | Admitting: Emergency Medicine

## 2014-06-23 DIAGNOSIS — I509 Heart failure, unspecified: Secondary | ICD-10-CM | POA: Diagnosis present

## 2014-06-23 DIAGNOSIS — E1165 Type 2 diabetes mellitus with hyperglycemia: Secondary | ICD-10-CM | POA: Diagnosis not present

## 2014-06-23 DIAGNOSIS — Z7901 Long term (current) use of anticoagulants: Secondary | ICD-10-CM | POA: Diagnosis not present

## 2014-06-23 DIAGNOSIS — R0902 Hypoxemia: Secondary | ICD-10-CM | POA: Diagnosis present

## 2014-06-23 DIAGNOSIS — J209 Acute bronchitis, unspecified: Secondary | ICD-10-CM | POA: Diagnosis not present

## 2014-06-23 DIAGNOSIS — I129 Hypertensive chronic kidney disease with stage 1 through stage 4 chronic kidney disease, or unspecified chronic kidney disease: Secondary | ICD-10-CM | POA: Diagnosis present

## 2014-06-23 DIAGNOSIS — M810 Age-related osteoporosis without current pathological fracture: Secondary | ICD-10-CM | POA: Diagnosis present

## 2014-06-23 DIAGNOSIS — Z8249 Family history of ischemic heart disease and other diseases of the circulatory system: Secondary | ICD-10-CM | POA: Diagnosis not present

## 2014-06-23 DIAGNOSIS — I480 Paroxysmal atrial fibrillation: Secondary | ICD-10-CM | POA: Diagnosis present

## 2014-06-23 DIAGNOSIS — Z794 Long term (current) use of insulin: Secondary | ICD-10-CM | POA: Diagnosis not present

## 2014-06-23 DIAGNOSIS — K219 Gastro-esophageal reflux disease without esophagitis: Secondary | ICD-10-CM | POA: Diagnosis present

## 2014-06-23 DIAGNOSIS — R0989 Other specified symptoms and signs involving the circulatory and respiratory systems: Secondary | ICD-10-CM | POA: Diagnosis not present

## 2014-06-23 DIAGNOSIS — Z5181 Encounter for therapeutic drug level monitoring: Secondary | ICD-10-CM

## 2014-06-23 DIAGNOSIS — I4891 Unspecified atrial fibrillation: Secondary | ICD-10-CM | POA: Diagnosis present

## 2014-06-23 DIAGNOSIS — E785 Hyperlipidemia, unspecified: Secondary | ICD-10-CM | POA: Diagnosis present

## 2014-06-23 DIAGNOSIS — I5032 Chronic diastolic (congestive) heart failure: Secondary | ICD-10-CM | POA: Diagnosis present

## 2014-06-23 DIAGNOSIS — E119 Type 2 diabetes mellitus without complications: Secondary | ICD-10-CM | POA: Diagnosis not present

## 2014-06-23 DIAGNOSIS — R7989 Other specified abnormal findings of blood chemistry: Secondary | ICD-10-CM

## 2014-06-23 DIAGNOSIS — N179 Acute kidney failure, unspecified: Secondary | ICD-10-CM | POA: Diagnosis present

## 2014-06-23 DIAGNOSIS — E269 Hyperaldosteronism, unspecified: Secondary | ICD-10-CM

## 2014-06-23 DIAGNOSIS — J42 Unspecified chronic bronchitis: Secondary | ICD-10-CM | POA: Diagnosis not present

## 2014-06-23 DIAGNOSIS — Z882 Allergy status to sulfonamides status: Secondary | ICD-10-CM

## 2014-06-23 DIAGNOSIS — E118 Type 2 diabetes mellitus with unspecified complications: Secondary | ICD-10-CM

## 2014-06-23 DIAGNOSIS — IMO0002 Reserved for concepts with insufficient information to code with codable children: Secondary | ICD-10-CM

## 2014-06-23 DIAGNOSIS — R748 Abnormal levels of other serum enzymes: Secondary | ICD-10-CM | POA: Diagnosis not present

## 2014-06-23 DIAGNOSIS — IMO0001 Reserved for inherently not codable concepts without codable children: Secondary | ICD-10-CM | POA: Diagnosis present

## 2014-06-23 DIAGNOSIS — J45909 Unspecified asthma, uncomplicated: Secondary | ICD-10-CM | POA: Diagnosis not present

## 2014-06-23 DIAGNOSIS — N189 Chronic kidney disease, unspecified: Secondary | ICD-10-CM | POA: Diagnosis present

## 2014-06-23 DIAGNOSIS — Z881 Allergy status to other antibiotic agents status: Secondary | ICD-10-CM

## 2014-06-23 DIAGNOSIS — I10 Essential (primary) hypertension: Secondary | ICD-10-CM | POA: Diagnosis present

## 2014-06-23 DIAGNOSIS — R0602 Shortness of breath: Secondary | ICD-10-CM | POA: Diagnosis not present

## 2014-06-23 DIAGNOSIS — R0609 Other forms of dyspnea: Secondary | ICD-10-CM | POA: Diagnosis not present

## 2014-06-23 DIAGNOSIS — I482 Chronic atrial fibrillation, unspecified: Secondary | ICD-10-CM

## 2014-06-23 HISTORY — DX: Chronic kidney disease, stage 3 (moderate): N18.3

## 2014-06-23 HISTORY — DX: Type 2 diabetes mellitus without complications: E11.9

## 2014-06-23 HISTORY — DX: Basal cell carcinoma of skin, unspecified: C44.91

## 2014-06-23 HISTORY — DX: Unspecified chronic bronchitis: J42

## 2014-06-23 HISTORY — DX: Personal history of other diseases of the digestive system: Z87.19

## 2014-06-23 HISTORY — DX: Chronic kidney disease, stage 3 unspecified: N18.30

## 2014-06-23 LAB — COMPREHENSIVE METABOLIC PANEL
ALBUMIN: 4 g/dL (ref 3.5–5.2)
ALK PHOS: 64 U/L (ref 39–117)
ALT: 14 U/L (ref 0–35)
AST: 20 U/L (ref 0–37)
BUN: 23 mg/dL (ref 6–23)
CALCIUM: 9.8 mg/dL (ref 8.4–10.5)
CO2: 24 mEq/L (ref 19–32)
Chloride: 99 mEq/L (ref 96–112)
Creatinine, Ser: 1.62 mg/dL — ABNORMAL HIGH (ref 0.50–1.10)
GFR calc non Af Amer: 28 mL/min — ABNORMAL LOW (ref >90)
GFR, EST AFRICAN AMERICAN: 33 mL/min — AB (ref >90)
GLUCOSE: 149 mg/dL — AB (ref 70–99)
POTASSIUM: 4.2 meq/L (ref 3.7–5.3)
Sodium: 138 mEq/L (ref 137–147)
Total Bilirubin: 0.6 mg/dL (ref 0.3–1.2)
Total Protein: 6.7 g/dL (ref 6.0–8.3)

## 2014-06-23 LAB — CBC WITH DIFFERENTIAL/PLATELET
BASOS ABS: 0 10*3/uL (ref 0.0–0.1)
BASOS PCT: 0 % (ref 0–1)
EOS ABS: 0.2 10*3/uL (ref 0.0–0.7)
EOS PCT: 3 % (ref 0–5)
HCT: 35.5 % — ABNORMAL LOW (ref 36.0–46.0)
Hemoglobin: 11.7 g/dL — ABNORMAL LOW (ref 12.0–15.0)
LYMPHS ABS: 1 10*3/uL (ref 0.7–4.0)
Lymphocytes Relative: 13 % (ref 12–46)
MCH: 27.6 pg (ref 26.0–34.0)
MCHC: 33 g/dL (ref 30.0–36.0)
MCV: 83.7 fL (ref 78.0–100.0)
Monocytes Absolute: 0.6 10*3/uL (ref 0.1–1.0)
Monocytes Relative: 8 % (ref 3–12)
NEUTROS PCT: 76 % (ref 43–77)
Neutro Abs: 5.6 10*3/uL (ref 1.7–7.7)
PLATELETS: 152 10*3/uL (ref 150–400)
RBC: 4.24 MIL/uL (ref 3.87–5.11)
RDW: 15.3 % (ref 11.5–15.5)
WBC: 7.3 10*3/uL (ref 4.0–10.5)

## 2014-06-23 LAB — URINALYSIS, ROUTINE W REFLEX MICROSCOPIC
Bilirubin Urine: NEGATIVE
GLUCOSE, UA: NEGATIVE mg/dL
Hgb urine dipstick: NEGATIVE
Ketones, ur: NEGATIVE mg/dL
Leukocytes, UA: NEGATIVE
Nitrite: NEGATIVE
Protein, ur: 30 mg/dL — AB
SPECIFIC GRAVITY, URINE: 1.012 (ref 1.005–1.030)
UROBILINOGEN UA: 0.2 mg/dL (ref 0.0–1.0)
pH: 5.5 (ref 5.0–8.0)

## 2014-06-23 LAB — GLUCOSE, CAPILLARY: GLUCOSE-CAPILLARY: 198 mg/dL — AB (ref 70–99)

## 2014-06-23 LAB — PROTIME-INR
INR: 2.13 — ABNORMAL HIGH (ref 0.00–1.49)
Prothrombin Time: 23.8 seconds — ABNORMAL HIGH (ref 11.6–15.2)

## 2014-06-23 LAB — URINE MICROSCOPIC-ADD ON

## 2014-06-23 LAB — APTT: aPTT: 70 seconds — ABNORMAL HIGH (ref 24–37)

## 2014-06-23 LAB — PRO B NATRIURETIC PEPTIDE: Pro B Natriuretic peptide (BNP): 2207 pg/mL — ABNORMAL HIGH (ref 0–450)

## 2014-06-23 LAB — TROPONIN I

## 2014-06-23 MED ORDER — DEXTROSE 5 % IV SOLN
500.0000 mg | INTRAVENOUS | Status: DC
  Filled 2014-06-23: qty 500

## 2014-06-23 MED ORDER — INSULIN DETEMIR 100 UNIT/ML FLEXPEN: 10.0000 [IU] | PEN_INJECTOR | Freq: Every day | SUBCUTANEOUS | Status: DC

## 2014-06-23 MED ORDER — ADULT MULTIVITAMIN W/MINERALS CH
1.0000 | ORAL_TABLET | Freq: Every day | ORAL | Status: DC
  Administered 2014-06-24: 1 via ORAL
  Filled 2014-06-23: qty 1

## 2014-06-23 MED ORDER — LORATADINE 10 MG PO TABS
10.0000 mg | ORAL_TABLET | Freq: Every day | ORAL | Status: DC
  Administered 2014-06-24: 10 mg via ORAL
  Filled 2014-06-23: qty 1

## 2014-06-23 MED ORDER — ALBUTEROL SULFATE (2.5 MG/3ML) 0.083% IN NEBU
5.0000 mg | INHALATION_SOLUTION | Freq: Once | RESPIRATORY_TRACT | Status: AC
  Administered 2014-06-23: 5 mg via RESPIRATORY_TRACT
  Filled 2014-06-23: qty 6

## 2014-06-23 MED ORDER — WARFARIN SODIUM 5 MG PO TABS
5.0000 mg | ORAL_TABLET | Freq: Every day | ORAL | Status: DC
  Administered 2014-06-23: 5 mg via ORAL
  Filled 2014-06-23 (×2): qty 1

## 2014-06-23 MED ORDER — IPRATROPIUM BROMIDE 0.02 % IN SOLN
0.5000 mg | Freq: Once | RESPIRATORY_TRACT | Status: AC
  Administered 2014-06-23: 0.5 mg via RESPIRATORY_TRACT
  Filled 2014-06-23: qty 2.5

## 2014-06-23 MED ORDER — SODIUM CHLORIDE 0.9 % IJ SOLN
3.0000 mL | Freq: Two times a day (BID) | INTRAMUSCULAR | Status: DC
  Administered 2014-06-23 – 2014-06-24 (×2): 3 mL via INTRAVENOUS

## 2014-06-23 MED ORDER — FUROSEMIDE 10 MG/ML IJ SOLN
60.0000 mg | Freq: Once | INTRAMUSCULAR | Status: AC
  Administered 2014-06-23: 60 mg via INTRAVENOUS
  Filled 2014-06-23: qty 6

## 2014-06-23 MED ORDER — LISINOPRIL 10 MG PO TABS
10.0000 mg | ORAL_TABLET | Freq: Two times a day (BID) | ORAL | Status: DC
  Administered 2014-06-23: 10 mg via ORAL
  Filled 2014-06-23 (×3): qty 1

## 2014-06-23 MED ORDER — INSULIN ASPART 100 UNIT/ML ~~LOC~~ SOLN
0.0000 [IU] | Freq: Three times a day (TID) | SUBCUTANEOUS | Status: DC
  Administered 2014-06-24: 3 [IU] via SUBCUTANEOUS
  Administered 2014-06-24: 5 [IU] via SUBCUTANEOUS

## 2014-06-23 MED ORDER — HYDROCODONE-HOMATROPINE 5-1.5 MG/5ML PO SYRP: 5.0000 mL | ORAL_SOLUTION | Freq: Four times a day (QID) | ORAL | Status: DC | PRN

## 2014-06-23 MED ORDER — AZITHROMYCIN 250 MG PO TABS
500.0000 mg | ORAL_TABLET | Freq: Once | ORAL | Status: AC
  Administered 2014-06-23: 500 mg via ORAL
  Filled 2014-06-23: qty 2

## 2014-06-23 MED ORDER — SODIUM CHLORIDE 0.9 % IV BOLUS (SEPSIS): 700.0000 mL | Freq: Once | INTRAVENOUS | Status: DC

## 2014-06-23 MED ORDER — LABETALOL HCL 5 MG/ML IV SOLN
10.0000 mg | INTRAVENOUS | Status: DC | PRN
Start: 2014-06-23 — End: 2014-06-24
  Filled 2014-06-23: qty 4

## 2014-06-23 MED ORDER — ALBUTEROL SULFATE (2.5 MG/3ML) 0.083% IN NEBU
2.5000 mg | INHALATION_SOLUTION | Freq: Once | RESPIRATORY_TRACT | Status: AC
  Administered 2014-06-23: 2.5 mg via RESPIRATORY_TRACT
  Filled 2014-06-23: qty 3

## 2014-06-23 MED ORDER — DEXTROSE 5 % IV SOLN
1.0000 g | Freq: Once | INTRAVENOUS | Status: AC
  Administered 2014-06-23: 1 g via INTRAVENOUS
  Filled 2014-06-23: qty 10

## 2014-06-23 MED ORDER — LABETALOL HCL 200 MG PO TABS
200.0000 mg | ORAL_TABLET | Freq: Two times a day (BID) | ORAL | Status: DC
  Administered 2014-06-23 – 2014-06-24 (×2): 200 mg via ORAL
  Filled 2014-06-23 (×3): qty 1

## 2014-06-23 MED ORDER — FLUTICASONE PROPIONATE 50 MCG/ACT NA SUSP
1.0000 | Freq: Every day | NASAL | Status: DC
  Administered 2014-06-24: 1 via NASAL
  Filled 2014-06-23: qty 16

## 2014-06-23 MED ORDER — METHYLPREDNISOLONE SODIUM SUCC 125 MG IJ SOLR
125.0000 mg | Freq: Once | INTRAMUSCULAR | Status: AC
  Administered 2014-06-23: 125 mg via INTRAVENOUS
  Filled 2014-06-23: qty 2

## 2014-06-23 MED ORDER — FUROSEMIDE 20 MG PO TABS
20.0000 mg | ORAL_TABLET | Freq: Every day | ORAL | Status: DC
  Administered 2014-06-24: 20 mg via ORAL
  Filled 2014-06-23: qty 1

## 2014-06-23 MED ORDER — FERROUS SULFATE 325 (65 FE) MG PO TABS
325.0000 mg | ORAL_TABLET | Freq: Two times a day (BID) | ORAL | Status: DC
  Administered 2014-06-23 – 2014-06-24 (×2): 325 mg via ORAL
  Filled 2014-06-23 (×4): qty 1

## 2014-06-23 MED ORDER — WARFARIN - PHARMACIST DOSING INPATIENT: Freq: Every day | Status: DC

## 2014-06-23 MED ORDER — SIMVASTATIN 20 MG PO TABS
20.0000 mg | ORAL_TABLET | Freq: Every day | ORAL | Status: DC
  Filled 2014-06-23 (×2): qty 1

## 2014-06-23 MED ORDER — CLONIDINE HCL 0.1 MG PO TABS
0.1000 mg | ORAL_TABLET | Freq: Every day | ORAL | Status: DC
  Administered 2014-06-23: 0.1 mg via ORAL
  Filled 2014-06-23 (×2): qty 1

## 2014-06-23 MED ORDER — HYDRALAZINE HCL 50 MG PO TABS
100.0000 mg | ORAL_TABLET | Freq: Two times a day (BID) | ORAL | Status: DC
  Administered 2014-06-23 – 2014-06-24 (×2): 100 mg via ORAL
  Filled 2014-06-23 (×3): qty 2

## 2014-06-23 MED ORDER — METHYLPREDNISOLONE SODIUM SUCC 40 MG IJ SOLR
40.0000 mg | Freq: Two times a day (BID) | INTRAMUSCULAR | Status: DC
  Administered 2014-06-24: 40 mg via INTRAVENOUS
  Filled 2014-06-23 (×3): qty 1

## 2014-06-23 MED ORDER — DILTIAZEM HCL 60 MG PO TABS
60.0000 mg | ORAL_TABLET | Freq: Two times a day (BID) | ORAL | Status: DC
  Administered 2014-06-23 – 2014-06-24 (×2): 60 mg via ORAL
  Filled 2014-06-23 (×3): qty 1

## 2014-06-23 MED ORDER — INSULIN DETEMIR 100 UNIT/ML ~~LOC~~ SOLN
10.0000 [IU] | Freq: Every day | SUBCUTANEOUS | Status: DC
Start: 2014-06-23 — End: 2014-06-24
  Administered 2014-06-23: 10 [IU] via SUBCUTANEOUS
  Filled 2014-06-23 (×2): qty 0.1

## 2014-06-23 MED ORDER — PANTOPRAZOLE SODIUM 40 MG PO TBEC
40.0000 mg | DELAYED_RELEASE_TABLET | Freq: Every day | ORAL | Status: DC
  Administered 2014-06-23 – 2014-06-24 (×2): 40 mg via ORAL
  Filled 2014-06-23 (×2): qty 1

## 2014-06-23 NOTE — Progress Notes (Signed)
ANTICOAGULATION CONSULT NOTE - Initial Consult  Pharmacy Consult for Coumadin Indication: atrial fibrillation  Allergies  Allergen Reactions  . Levaquin [Levofloxacin]   . Sulfa Antibiotics     Patient Measurements: Height: 5\' 4"  (162.6 cm) Weight: 150 lb 5.7 oz (68.2 kg) IBW/kg (Calculated) : 54.7  Vital Signs: Temp: 99 F (37.2 C) (06/24 2059) Temp src: Oral (06/24 2059) BP: 174/77 mmHg (06/24 2059) Pulse Rate: 83 (06/24 2059)  Labs:  Recent Labs  06/23/14 1715  HGB 11.7*  HCT 35.5*  PLT 152  APTT 70*  LABPROT 23.8*  INR 2.13*  CREATININE 1.62*  TROPONINI <0.30    Estimated Creatinine Clearance: 25.4 ml/min (by C-G formula based on Cr of 1.62).   Medical History: Past Medical History  Diagnosis Date  . H/O: hysterectomy   . Hypertension   . Diabetes mellitus without complication     Type II  . Allergy   . Arthritis   . GERD (gastroesophageal reflux disease)   . Hyperlipidemia   . Chronic kidney disease   . Osteoporosis     Assessment: 80 year old female on Coumadin PTA for Afib INR therapeutic on home dose of 5 mg daily Last dose 6/23  Goal of Therapy:  INR 2-3 Monitor platelets by anticoagulation protocol: Yes   Plan:  1) Coumadin 5 mg po daily at 1800 pm 2) Daily INR  Thank you. Anette Guarneri, PharmD [phone]  06/23/2014,9:14 PM

## 2014-06-23 NOTE — ED Notes (Signed)
Dr. Tomi Bamberger is at the bedside.

## 2014-06-23 NOTE — ED Notes (Signed)
Pt reports sob that started last night. Unable to sleep due to sob and cough. Wheezing noted on arrival. Also reports swelling to extremities for several days. spo2 93% at triage. ekg done. Had fall this morning due to dizziness.

## 2014-06-23 NOTE — ED Provider Notes (Signed)
CSN: [id number]     Arrival date & time 06/23/14  1603 History   First MD Initiated Contact with Patient 06/23/14 1623     Chief Complaint  Patient presents with  . Shortness of Breath  . Wheezing  . Fall     (Consider location/radiation/quality/duration/timing/severity/associated sxs/prior Treatment) HPI Patient reports yesterday and she started having wheezing and shortness of breath. She states she is coughing up yellow sputum. She states her chest is sore on the sides from coughing. She denies any nausea, vomiting, diarrhea, sore throat or rhinorrhea. She has not had fever. She states she's never used a nebulizer or inhaler in the past other than Flonase. However her daughter said at [place] she had a similar  bronchitis type episode and was treated with a nebulizer in the doctor's office. She was started on Advair which she quit taking because it made her too jittery. Nurses report when they put her in her room her pulse ox dropped to 87% on room air. She's also been noticing some swelling of her ankles but cannot tell me for how long.  Patient has a history of atrial fibrillation. She states her last episode was a week ago. She states she can tell because her heart races. She does not get lightheaded or dizzy with it.  PCP Dr Lawernce Pitts Cardiologist Dr. Tamala Julian  Past Medical History  Diagnosis Date  . H/O: hysterectomy   . Hypertension   . Diabetes mellitus without complication     Type II  . Allergy   . Arthritis   . GERD (gastroesophageal reflux disease)   . Hyperlipidemia   . Chronic kidney disease   . Osteoporosis    Past Surgical History  Procedure Laterality Date  . Appendectomy    . Abdominal hysterectomy     Family History  Problem Relation Age of Onset  . Tuberculosis Mother   . Heart disease Father   . Heart disease Brother    History  Substance Use Topics  . Smoking status: Never Smoker   . Smokeless tobacco: Not on file  . Alcohol Use: No   + second hand  smoke Lives at home Lives with spouse  OB History   Grav Para Term Preterm Abortions TAB SAB Ect Mult Living                 Review of Systems  All other systems reviewed and are negative.     Allergies  Levaquin and Sulfa antibiotics  Home Medications   Prior to Admission medications   Medication Sig Start Date End Date Taking? Authorizing Sulamita Lafountain  Acetaminophen (TYLENOL PO) Take 2 tablets by mouth daily as needed (for headache).   Yes Historical Olivene Cookston, MD  amoxicillin (AMOXIL) 500 MG capsule Take 500 mg by mouth 2 (two) times daily. 05/18/14  Yes Historical Arieanna Pressey, MD  cloNIDine (CATAPRES) 0.1 MG tablet Take 1 tablet (0.1 mg total) by mouth daily. 03/26/14  Yes Belva Crome III, MD  desloratadine (CLARINEX) 5 MG tablet Take 5 mg by mouth daily.   Yes Historical Chelci Wintermute, MD  diltiazem (CARDIZEM) 60 MG tablet Take 60 mg by mouth 2 (two) times daily.   Yes Historical Antwonette Feliz, MD  ferrous sulfate 325 (65 FE) MG tablet Take 325 mg by mouth 2 (two) times daily.    Yes Historical Thena Devora, MD  fluticasone (FLONASE) 50 MCG/ACT nasal spray Place 1 spray into both nostrils daily.    Yes Historical Nicodemus Denk, MD  furosemide (LASIX) 20 MG tablet  Take 20 mg by mouth daily.   Yes Historical Cartrell Bentsen, MD  glipiZIDE (GLUCOTROL) 10 MG tablet Take 10 mg by mouth 2 (two) times daily.    Yes Historical Davionna Blacksher, MD  hydrALAZINE (APRESOLINE) 50 MG tablet Take 50 mg by mouth as directed. 2 TABLETS TWICE DAILY   Yes Historical Jenan Ellegood, MD  HYDROcodone-homatropine (HYCODAN) 5-1.5 MG/5ML syrup Take 5 mLs by mouth every 6 (six) hours as needed for cough. 04/04/14  Yes Leandrew Koyanagi, MD  Insulin Detemir (LEVEMIR FLEXPEN) 100 UNIT/ML Pen Inject 10 Units into the skin daily at 10 pm.   Yes Historical Karington Zarazua, MD  labetalol (NORMODYNE) 200 MG tablet Take 1 tablet (200 mg total) by mouth 2 (two) times daily. 03/26/14  Yes Belva Crome III, MD  lisinopril (PRINIVIL,ZESTRIL) 10 MG tablet Take 10 mg by  mouth 2 (two) times daily.   Yes Historical Lalitha Ilyas, MD  lovastatin (MEVACOR) 40 MG tablet Take 1 tablet (40 mg total) by mouth at bedtime. 01/05/14  Yes Sinclair Grooms, MD  Multiple Vitamin (MULTIVITAMIN WITH MINERALS) TABS tablet Take 1 tablet by mouth daily.   Yes Historical Heba Ige, MD  pantoprazole (PROTONIX) 40 MG tablet Take 40 mg by mouth daily.   Yes Historical Bailea Beed, MD  Polyvinyl Alcohol-Povidone (REFRESH OP) Place 1 drop into both eyes 2 (two) times daily as needed (for dry eyes).   Yes Historical Vivian Okelley, MD  warfarin (COUMADIN) 2 MG tablet Take 5 mg by mouth daily.    Yes Historical Miraya Cudney, MD   BP 180/60  Pulse 71  Temp(Src) 98.2 F (36.8 C) (Oral)  Resp 16  SpO2 98%  Vital signs normal except tachycardia  Physical Exam  Nursing note and vitals reviewed. Constitutional: She is oriented to person, place, and time. She appears well-developed and well-nourished.  Non-toxic appearance. She does not appear ill. No distress.  HENT:  Head: Normocephalic and atraumatic.  Right Ear: External ear normal.  Left Ear: External ear normal.  Nose: Nose normal. No mucosal edema or rhinorrhea.  Mouth/Throat: Oropharynx is clear and moist and mucous membranes are normal. No dental abscesses or uvula swelling.  Eyes: Conjunctivae and EOM are normal. Pupils are equal, round, and reactive to light.  Neck: Normal range of motion and full passive range of motion without pain. Neck supple.  Cardiovascular: Normal rate, regular rhythm and normal heart sounds.  Exam reveals no gallop and no friction rub.   No murmur heard. Pulmonary/Chest: Accessory muscle usage present. Tachypnea noted. She is in respiratory distress. She has decreased breath sounds. She has wheezes. She has no rhonchi. She has no rales. She exhibits no tenderness and no crepitus.  Coughing frequently with yellow-green sputum production, she has audible wheezing at times  Abdominal: Soft. Normal appearance and bowel  sounds are normal. She exhibits no distension. There is no tenderness. There is no rebound and no guarding.  Musculoskeletal: Normal range of motion. She exhibits edema. She exhibits no tenderness.  Moves all extremities well. She has edema of her ankles bilaterally  Neurological: She is alert and oriented to person, place, and time. She has normal strength. No cranial nerve deficit.  Skin: Skin is warm, dry and intact. No rash noted. No erythema. No pallor.  Psychiatric: She has a normal mood and affect. Her speech is normal and behavior is normal. Her mood appears not anxious.    ED Course  Procedures (including critical care time)  Medications  sodium chloride 0.9 % bolus 700 mL (0  mLs Intravenous Hold 06/23/14 1819)  cefTRIAXone (ROCEPHIN) 1 g in dextrose 5 % 50 mL IVPB (1 g Intravenous New Bag/Given 06/23/14 1950)  azithromycin (ZITHROMAX) 500 mg in dextrose 5 % 250 mL IVPB (not administered)  methylPREDNISolone sodium succinate (SOLU-MEDROL) 40 mg/mL injection 40 mg (not administered)  Insulin Detemir (LEVEMIR) FlexPen 10 Units (not administered)  hydrALAZINE (APRESOLINE) tablet 50 mg (not administered)  HYDROcodone-homatropine (HYCODAN) 5-1.5 MG/5ML syrup 5 mL (not administered)  multivitamin with minerals tablet 1 tablet (not administered)  pantoprazole (PROTONIX) EC tablet 40 mg (not administered)  simvastatin (ZOCOR) tablet 20 mg (not administered)  lisinopril (PRINIVIL,ZESTRIL) tablet 10 mg (not administered)  labetalol (NORMODYNE) tablet 200 mg (not administered)  furosemide (LASIX) tablet 20 mg (not administered)  fluticasone (FLONASE) 50 MCG/ACT nasal spray 1 spray (not administered)  diltiazem (CARDIZEM) tablet 60 mg (not administered)  ferrous sulfate tablet 325 mg (not administered)  loratadine (CLARITIN) tablet 10 mg (not administered)  cloNIDine (CATAPRES) tablet 0.1 mg (not administered)  albuterol (PROVENTIL) (2.5 MG/3ML) 0.083% nebulizer solution 5 mg (5 mg  Nebulization Given 06/23/14 1706)  ipratropium (ATROVENT) nebulizer solution 0.5 mg (0.5 mg Nebulization Given 06/23/14 1706)  methylPREDNISolone sodium succinate (SOLU-MEDROL) 125 mg/2 mL injection 125 mg (125 mg Intravenous Given 06/23/14 1717)  albuterol (PROVENTIL) (2.5 MG/3ML) 0.083% nebulizer solution 5 mg (5 mg Nebulization Given 06/23/14 1820)  ipratropium (ATROVENT) nebulizer solution 0.5 mg (0.5 mg Nebulization Given 06/23/14 1820)  furosemide (LASIX) injection 60 mg (60 mg Intravenous Given 06/23/14 1914)  azithromycin (ZITHROMAX) tablet 500 mg (500 mg Oral Given 06/23/14 1949)   18:00 feeling better after her nebulizer, no palpations with it. Has diffuse lower pitched wheezes and rhonchi now. Patient given individual nebulizers because of her history of intermittent atrial fib I was concerned a continuous nebulizer would make her go into atrial fibrillation. We discussed her initial test results.  1900 patient rechecked after her second nebulizer. She states she's feeling much improved. Her lungs are now clear. There are however some rales at both bases. We discussed the rest of her test results had returned. She is agreeable for admission. Patient was started for antibiotics for her bronchitis, I used the community associated pneumonia antibiotics.  Review of patient's prior chart shows she did have an echocardiogram done in 2014 however it must have been done in her cardiologist's office because there is no interpretation.   19:51 Dr Alcario Drought, admit to tele, team 10   Labs Review Results for orders placed during the hospital encounter of 06/23/14  CBC WITH DIFFERENTIAL      Result Value Ref Range   WBC 7.3  4.0 - 10.5 K/uL   RBC 4.24  3.87 - 5.11 MIL/uL   Hemoglobin 11.7 (*) 12.0 - 15.0 g/dL   HCT 35.5 (*) 36.0 - 46.0 %   MCV 83.7  78.0 - 100.0 fL   MCH 27.6  26.0 - 34.0 pg   MCHC 33.0  30.0 - 36.0 g/dL   RDW 15.3  11.5 - 15.5 %   Platelets 152  150 - 400 K/uL   Neutrophils  Relative % 76  43 - 77 %   Neutro Abs 5.6  1.7 - 7.7 K/uL   Lymphocytes Relative 13  12 - 46 %   Lymphs Abs 1.0  0.7 - 4.0 K/uL   Monocytes Relative 8  3 - 12 %   Monocytes Absolute 0.6  0.1 - 1.0 K/uL   Eosinophils Relative 3  0 - 5 %  Eosinophils Absolute 0.2  0.0 - 0.7 K/uL   Basophils Relative 0  0 - 1 %   Basophils Absolute 0.0  0.0 - 0.1 K/uL  COMPREHENSIVE METABOLIC PANEL      Result Value Ref Range   Sodium 138  137 - 147 mEq/L   Potassium 4.2  3.7 - 5.3 mEq/L   Chloride 99  96 - 112 mEq/L   CO2 24  19 - 32 mEq/L   Glucose, Bld 149 (*) 70 - 99 mg/dL   BUN 23  6 - 23 mg/dL   Creatinine, Ser 1.62 (*) 0.50 - 1.10 mg/dL   Calcium 9.8  8.4 - 10.5 mg/dL   Total Protein 6.7  6.0 - 8.3 g/dL   Albumin 4.0  3.5 - 5.2 g/dL   AST 20  0 - 37 U/L   ALT 14  0 - 35 U/L   Alkaline Phosphatase 64  39 - 117 U/L   Total Bilirubin 0.6  0.3 - 1.2 mg/dL   GFR calc non Af Amer 28 (*) >90 mL/min   GFR calc Af Amer 33 (*) >90 mL/min  PRO B NATRIURETIC PEPTIDE      Result Value Ref Range   Pro B Natriuretic peptide (BNP) 2207.0 (*) 0 - 450 pg/mL  APTT      Result Value Ref Range   aPTT 70 (*) 24 - 37 seconds  PROTIME-INR      Result Value Ref Range   Prothrombin Time 23.8 (*) 11.6 - 15.2 seconds   INR 2.13 (*) 0.00 - 1.49  TROPONIN I      Result Value Ref Range   Troponin I <0.30  <0.30 ng/mL   Laboratory interpretation all normal except therapeutic INR, elevated BNP with no baseline, renal insuffic, mild anemia     Imaging Review Dg Chest Portable 1 View  06/23/2014   CLINICAL DATA:  Shortness of breath.  EXAM: PORTABLE CHEST - 1 VIEW  COMPARISON:  04/04/2014.  FINDINGS: The heart is borderline enlarged but stable. There is tortuosity, ectasia and calcification of the thoracic aorta. Mild chronic lung changes but no acute pulmonary findings. No pleural effusion. The bony thorax is intact.  IMPRESSION: Stable mild cardiac enlargement and tortuous ectatic and calcified thoracic aorta.   Mild chronic bronchitic type lung changes but no acute pulmonary findings.   Electronically Signed   By: Kalman Jewels M.D.   On: 06/23/2014 18:15     EKG Interpretation   Date/Time:  Wednesday June 23 2014 16:09:04 EDT Ventricular Rate:  75 PR Interval:  174 QRS Duration: 96 QT Interval:  424 QTC Calculation: 473 R Axis:   44 Text Interpretation:  Normal sinus rhythm Possible Anterior infarct , age  undetermined Baseline wander No significant change since last tracing 09 Jul 2000 Confirmed by [facility]  MD-I, IVA ([id number]) on 06/23/2014 4:24:18 PM      MDM   Final diagnoses:  Bronchitis with bronchospasm  Hypoxia  Elevated brain natriuretic peptide (BNP) level    Plan admit   Rolland Porter, MD, Abram Sander     Janice Norrie, MD 06/23/14 [phone]

## 2014-06-23 NOTE — H&P (Addendum)
[facility] History and Physical  Ariel Avila CHY:[id number] DOB: September 04, 1930 DOA: 06/23/2014  Referring physician: EDP PCP: Gennette Pac, MD   Chief Complaint: SOB   HPI: Ariel Avila is a 80 y.o. female who presents to the ED with cough, congestion, and SOB.  Cough is productive of a yellow sputum.  She doesn't have a diagnosed history of COPD however daughter notes similar symptoms this past easter and a year of intermittent symptoms of wheezing.  She is on no home nebs.  She also notes increased peripheral edema but isnt sure how long this has been going on for.  In ED symptoms were greatly improved with solumedrol, breathing treatments.  Patient also given 60mg  IV lasix.  Review of Systems: Systems reviewed.  As above, otherwise negative  Past Medical History  Diagnosis Date  . H/O: hysterectomy   . Hypertension   . Diabetes mellitus without complication     Type II  . Allergy   . Arthritis   . GERD (gastroesophageal reflux disease)   . Hyperlipidemia   . Chronic kidney disease   . Osteoporosis    Past Surgical History  Procedure Laterality Date  . Appendectomy    . Abdominal hysterectomy     Social History:  reports that she has never smoked. She does not have any smokeless tobacco history on file. She reports that she does not drink alcohol or use illicit drugs.  Allergies  Allergen Reactions  . Levaquin [Levofloxacin]   . Sulfa Antibiotics     Family History  Problem Relation Age of Onset  . Tuberculosis Mother   . Heart disease Father   . Heart disease Brother      Prior to Admission medications   Medication Sig Start Date End Date Taking? Authorizing Provider  Acetaminophen (TYLENOL PO) Take 2 tablets by mouth daily as needed (for headache).   Yes Historical Provider, MD  amoxicillin (AMOXIL) 500 MG capsule Take 500 mg by mouth 2 (two) times daily. 05/18/14  Yes Historical Provider, MD  cloNIDine (CATAPRES) 0.1 MG tablet Take 1 tablet (0.1  mg total) by mouth daily. 03/26/14  Yes Belva Crome III, MD  desloratadine (CLARINEX) 5 MG tablet Take 5 mg by mouth daily.   Yes Historical Provider, MD  diltiazem (CARDIZEM) 60 MG tablet Take 60 mg by mouth 2 (two) times daily.   Yes Historical Provider, MD  ferrous sulfate 325 (65 FE) MG tablet Take 325 mg by mouth 2 (two) times daily.    Yes Historical Provider, MD  fluticasone (FLONASE) 50 MCG/ACT nasal spray Place 1 spray into both nostrils daily.    Yes Historical Provider, MD  furosemide (LASIX) 20 MG tablet Take 20 mg by mouth daily.   Yes Historical Provider, MD  glipiZIDE (GLUCOTROL) 10 MG tablet Take 10 mg by mouth 2 (two) times daily.    Yes Historical Provider, MD  hydrALAZINE (APRESOLINE) 50 MG tablet Take 50 mg by mouth as directed. 2 TABLETS TWICE DAILY   Yes Historical Provider, MD  HYDROcodone-homatropine (HYCODAN) 5-1.5 MG/5ML syrup Take 5 mLs by mouth every 6 (six) hours as needed for cough. 04/04/14  Yes Leandrew Koyanagi, MD  Insulin Detemir (LEVEMIR FLEXPEN) 100 UNIT/ML Pen Inject 10 Units into the skin daily at 10 pm.   Yes Historical Provider, MD  labetalol (NORMODYNE) 200 MG tablet Take 1 tablet (200 mg total) by mouth 2 (two) times daily. 03/26/14  Yes Belva Crome III, MD  lisinopril (PRINIVIL,ZESTRIL) 10 MG  tablet Take 10 mg by mouth 2 (two) times daily.   Yes Historical Provider, MD  lovastatin (MEVACOR) 40 MG tablet Take 1 tablet (40 mg total) by mouth at bedtime. 01/05/14  Yes Sinclair Grooms, MD  Multiple Vitamin (MULTIVITAMIN WITH MINERALS) TABS tablet Take 1 tablet by mouth daily.   Yes Historical Provider, MD  pantoprazole (PROTONIX) 40 MG tablet Take 40 mg by mouth daily.   Yes Historical Provider, MD  Polyvinyl Alcohol-Povidone (REFRESH OP) Place 1 drop into both eyes 2 (two) times daily as needed (for dry eyes).   Yes Historical Provider, MD  warfarin (COUMADIN) 2 MG tablet Take 5 mg by mouth daily.    Yes Historical Provider, MD   Physical Exam: Filed  Vitals:   06/23/14 2012  BP: 185/72  Pulse: 83  Temp:   Resp: 16    BP 185/72  Pulse 83  Temp(Src) 98.2 F (36.8 C) (Oral)  Resp 16  SpO2 95%  General Appearance:    Alert, oriented, no distress, appears stated age  Head:    Normocephalic, atraumatic  Eyes:    PERRL, EOMI, sclera non-icteric        Nose:   Nares without drainage or epistaxis. Mucosa, turbinates normal  Throat:   Moist mucous membranes. Oropharynx without erythema or exudate.  Neck:   Supple. No carotid bruits.  No thyromegaly.  No lymphadenopathy.   Back:     No CVA tenderness, no spinal tenderness  Lungs:     Bilateral wheezes, not really any rhonchi however.  Chest wall:    No tenderness to palpitation  Heart:    Regular rate and rhythm without murmurs, gallops, rubs  Abdomen:     Soft, non-tender, nondistended, normal bowel sounds, no organomegaly  Genitalia:    deferred  Rectal:    deferred  Extremities:   No clubbing, cyanosis or edema.  Pulses:   2+ and symmetric all extremities  Skin:   Skin color, texture, turgor normal, no rashes or lesions  Lymph nodes:   Cervical, supraclavicular, and axillary nodes normal  Neurologic:   CNII-XII intact. Normal strength, sensation and reflexes      throughout    Labs on Admission:  Basic Metabolic Panel:  Recent Labs Lab 06/23/14 1715  NA 138  K 4.2  CL 99  CO2 24  GLUCOSE 149*  BUN 23  CREATININE 1.62*  CALCIUM 9.8   Liver Function Tests:  Recent Labs Lab 06/23/14 1715  AST 20  ALT 14  ALKPHOS 64  BILITOT 0.6  PROT 6.7  ALBUMIN 4.0   No results found for this basename: LIPASE, AMYLASE,  in the last 168 hours No results found for this basename: AMMONIA,  in the last 168 hours CBC:  Recent Labs Lab 06/23/14 1715  WBC 7.3  NEUTROABS 5.6  HGB 11.7*  HCT 35.5*  MCV 83.7  PLT 152   Cardiac Enzymes:  Recent Labs Lab 06/23/14 1715  TROPONINI <0.30    BNP (last 3 results)  Recent Labs  06/23/14 1715  PROBNP 2207.0*    CBG: No results found for this basename: GLUCAP,  in the last 168 hours  Radiological Exams on Admission: Dg Chest Portable 1 View  06/23/2014   CLINICAL DATA:  Shortness of breath.  EXAM: PORTABLE CHEST - 1 VIEW  COMPARISON:  04/04/2014.  FINDINGS: The heart is borderline enlarged but stable. There is tortuosity, ectasia and calcification of the thoracic aorta. Mild chronic lung changes but no acute  pulmonary findings. No pleural effusion. The bony thorax is intact.  IMPRESSION: Stable mild cardiac enlargement and tortuous ectatic and calcified thoracic aorta.  Mild chronic bronchitic type lung changes but no acute pulmonary findings.   Electronically Signed   By: Kalman Jewels M.D.   On: 06/23/2014 18:15    EKG: Independently reviewed.  Assessment/Plan Principal Problem:   Bronchitis, acute, with bronchospasm Active Problems:   Atrial fibrillation   Essential hypertension   Diabetes mellitus type 2, uncontrolled, with complications   Chronic diastolic heart failure   1. Acute bronchitis with bronchospasm - may also possibly have undiagnosed COPD.  Treating bronchitis with azithromycin, got 1 dose of rocephin in ED, steroids, adult wheeze protocol. 2. Chronic diastolic CHF - 2d echo ordered to reassess, no evidence of pulmonary edema on CXR, no crackles on physical exam (more wheezes on physical exam) to suggest acute exacerbation at this time, so will keep her on home meds but not treat as acute CHF exacerbation for now. 3. HTN - continue home meds, add prn labetalol for SBP > 180 4. DM2 - continue home levemir, hold glipizide, add med dose SSI. 5. Chronic AF - continue rate control, tele monitor, coumadin per pharm.    Code Status: Full  Family Communication: Daughter at bedside Disposition Plan: Admit to inpatient   Time spent: 29 min  GARDNER, JARED M. [facility] Pager [phone]  If 7AM-7PM, please contact the day team taking care of the  patient [email or web address] Password [email or web address] 06/23/2014, 8:16 PM

## 2014-06-23 NOTE — ED Notes (Signed)
Pt O2 dropped down to 89% pt placed on 2L of O2. SPO2 now at 92%

## 2014-06-23 NOTE — Progress Notes (Signed)
Received pt report from [address].

## 2014-06-24 DIAGNOSIS — J209 Acute bronchitis, unspecified: Secondary | ICD-10-CM | POA: Diagnosis not present

## 2014-06-24 DIAGNOSIS — E1165 Type 2 diabetes mellitus with hyperglycemia: Secondary | ICD-10-CM | POA: Diagnosis not present

## 2014-06-24 DIAGNOSIS — I059 Rheumatic mitral valve disease, unspecified: Secondary | ICD-10-CM

## 2014-06-24 DIAGNOSIS — I5032 Chronic diastolic (congestive) heart failure: Secondary | ICD-10-CM | POA: Diagnosis not present

## 2014-06-24 DIAGNOSIS — I4891 Unspecified atrial fibrillation: Secondary | ICD-10-CM | POA: Diagnosis not present

## 2014-06-24 LAB — CBC
HEMATOCRIT: 33.9 % — AB (ref 36.0–46.0)
Hemoglobin: 11.2 g/dL — ABNORMAL LOW (ref 12.0–15.0)
MCH: 27.4 pg (ref 26.0–34.0)
MCHC: 33 g/dL (ref 30.0–36.0)
MCV: 82.9 fL (ref 78.0–100.0)
Platelets: 164 10*3/uL (ref 150–400)
RBC: 4.09 MIL/uL (ref 3.87–5.11)
RDW: 15 % (ref 11.5–15.5)
WBC: 5.2 10*3/uL (ref 4.0–10.5)

## 2014-06-24 LAB — GLUCOSE, CAPILLARY
GLUCOSE-CAPILLARY: 193 mg/dL — AB (ref 70–99)
Glucose-Capillary: 225 mg/dL — ABNORMAL HIGH (ref 70–99)

## 2014-06-24 LAB — PROTIME-INR
INR: 1.97 — ABNORMAL HIGH (ref 0.00–1.49)
Prothrombin Time: 22.4 seconds — ABNORMAL HIGH (ref 11.6–15.2)

## 2014-06-24 LAB — BASIC METABOLIC PANEL
BUN: 31 mg/dL — AB (ref 6–23)
CO2: 24 mEq/L (ref 19–32)
Calcium: 9.4 mg/dL (ref 8.4–10.5)
Chloride: 97 mEq/L (ref 96–112)
Creatinine, Ser: 1.88 mg/dL — ABNORMAL HIGH (ref 0.50–1.10)
GFR calc Af Amer: 28 mL/min — ABNORMAL LOW (ref >90)
GFR calc non Af Amer: 24 mL/min — ABNORMAL LOW (ref >90)
Glucose, Bld: 275 mg/dL — ABNORMAL HIGH (ref 70–99)
POTASSIUM: 4.1 meq/L (ref 3.7–5.3)
Sodium: 136 mEq/L — ABNORMAL LOW (ref 137–147)

## 2014-06-24 MED ORDER — PREDNISONE 10 MG PO TABS: 50.0000 mg | ORAL_TABLET | Freq: Every day | ORAL | Status: DC

## 2014-06-24 MED ORDER — ALBUTEROL SULFATE HFA 108 (90 BASE) MCG/ACT IN AERS: 2.0000 | INHALATION_SPRAY | Freq: Four times a day (QID) | RESPIRATORY_TRACT | Status: DC | PRN

## 2014-06-24 MED ORDER — AZITHROMYCIN 500 MG PO TABS: 500.0000 mg | ORAL_TABLET | Freq: Every day | ORAL | Status: DC

## 2014-06-24 MED ORDER — IPRATROPIUM-ALBUTEROL 0.5-2.5 (3) MG/3ML IN SOLN
3.0000 mL | RESPIRATORY_TRACT | Status: DC | PRN
Start: 2014-06-24 — End: 2014-11-22

## 2014-06-24 MED ORDER — FUROSEMIDE 40 MG PO TABS
40.0000 mg | ORAL_TABLET | Freq: Every day | ORAL | Status: DC
Start: 2014-06-25 — End: 2014-06-24

## 2014-06-24 MED ORDER — FUROSEMIDE 20 MG PO TABS: 40.0000 mg | ORAL_TABLET | Freq: Every day | ORAL | Status: DC

## 2014-06-24 NOTE — Progress Notes (Signed)
Echocardiogram 2D Echocardiogram has been performed.  Ariel Avila 06/24/2014, 11:12 AM

## 2014-06-24 NOTE — Progress Notes (Signed)
Utilization review completed.  

## 2014-06-24 NOTE — Discharge Instructions (Signed)
Discontinue lisinopril for now and double lasix.  See Dr. Justin Mend on Tuesday as previously scheduled to follow up on kidney function (creatinine was elevated in the hospital) and check BP meds.  See Dr. Rex Kras regarding respiratory follow up.  Consider obtaining PFTs outpatient to establish a baseline of your pulmonary function.  Case management ran a benefits check on changing from Coumadin to Eliquis or Xeralto.  Both of those medications were significantly more expensive than Coumadin (150 - 300 / month)  Your INR is slightly low (1.97) please have your INR / Coumadin level checked in the next 2-3 days.

## 2014-06-24 NOTE — Progress Notes (Signed)
ANTICOAGULATION CONSULT NOTE - Follow Up Consult  Pharmacy Consult for coumadin Indication: atrial fibrillation  Allergies  Allergen Reactions  . Levaquin [Levofloxacin]   . Sulfa Antibiotics     Patient Measurements: Height: 5\' 4"  (162.6 cm) Weight: 150 lb 5.7 oz (68.2 kg) IBW/kg (Calculated) : 54.7   Vital Signs: Temp: 98.3 F (36.8 C) (06/25 0453) Temp src: Oral (06/25 0453) BP: 147/68 mmHg (06/25 0453) Pulse Rate: 66 (06/25 0453)  Labs:  Recent Labs  06/23/14 1715 06/24/14 0403  HGB 11.7* 11.2*  HCT 35.5* 33.9*  PLT 152 164  APTT 70*  --   LABPROT 23.8* 22.4*  INR 2.13* 1.97*  CREATININE 1.62* 1.88*  TROPONINI <0.30  --     Estimated Creatinine Clearance: 21.9 ml/min (by C-G formula based on Cr of 1.88).   Assessment: 80 yo female on coumadin PTA for afib and pharmacy has been consulted to dose. INR today= 1.97 . Patient noted on azithromycin for bronchitis (possible interaction with coumadin)  Home dose: 5mg /day  Goal of Therapy:  INR 2-3 Monitor platelets by anticoagulation protocol: Yes   Plan:  -Continue home dose coumadin for now -Daily PT/INR  Hildred Laser, Pharm D 06/24/2014 10:51 AM

## 2014-06-24 NOTE — Discharge Summary (Signed)
Physician Discharge Summary  Ariel Avila OBS:[id number] DOB: 01-06-1930 DOA: 06/23/2014  PCP: Gennette Pac, MD  Admit date: 06/23/2014 Discharge date: 06/24/2014  Time spent: 45 minutes  Recommendations for Outpatient Follow-up:  1. INR check in 2-3 days. 2. Follow Creatinine closely.  D/C'd lisinopril increased lasix. 3. Consider obtaining PFTs. 4. BNP elevated at 2200.  Discharge Diagnoses:  Principal Problem:   Bronchitis, acute, with bronchospasm Active Problems:   Atrial fibrillation   Essential hypertension   Diabetes mellitus type 2, uncontrolled, with complications   Chronic diastolic heart failure   Discharge Condition: stable.  Diet recommendation: heart healthy  Filed Weights   06/23/14 2059  Weight: 68.2 kg (150 lb 5.7 oz)    History of present illness:    Hospital Course:   Acute onset of Shortness of Breath. Likely acute bronchitis in the setting of  D-heart failure.  Patient also reports allergies since moving to [place].  These may have exacerbated acute bronchitis.  I doubt a PE in this very active woman who is on coumadin. She denies chest pain, tachycardia, tachypnea  Acute Bronchitis Patient admitted started on IV steroids, IV azithromycin, oxygen and Nebulizers.  She felt much better overnight. On the morning of 6/25 she ambulated off of oxygen without difficulty but still retained a non productive cough. Patient is requesting discharge to home.  She will be discharged on a short course of azithromycin and prednisone in addition to prn nebulizers.    Chronic Diastolic CHF Patient CXR shows mild cardiomegaly and increased pulmonary vascular prominence. BNP was elevated at 2200.  2D echo was non-revealing.  EF was normal.  Pulmonary pressures were not elevated. Patient reports her lasix was reduced not long ago. I will increase her lasix and ask her to follow up with her PCP and Dr. Senaida Lange.  Acute on chronic renal failure Baseline  creatinine is 1.4 - 1.5.  Currently creatinine is 1.88.  I discussed this over the phone with Dr. Justin Mend.  We agreed to discontinue the lisinopril, increase her lasix and follow her closely. She is scheduled to see Dr. Justin Mend in his office next Tuesday.  HTN BP was elevated during her acute episode but now appears more controlled. Given her elevation in creatinine we will d/c lisinopril as noted above.  DM2 Stable Continue previous home medications (glipizide, levemir)  Afib Rate controlled. On coumadin.  Slightly subtherapeutic. Recommend coumadin check in the next 2-3 days. Considered changing to Eliquis or Xeralto.  However patient is comfortable with coumadin and others are significantly more expensive under her insurance plan Ariel Avila - secondary).   Procedures:  2D Echo Study Conclusions  - Left ventricle: The cavity size was normal. There was mild concentric hypertrophy. Systolic function was normal. The estimated ejection fraction was in the range of 55% to 60%. Wall motion was normal; there were no regional wall motion abnormalities. - Mitral valve: There was mild regurgitation. - Left atrium: The atrium was mildly dilated. - Tricuspid valve: There was trivial regurgitation. - Pulmonic valve: There was trivial regurgitation.   Consultations:  none  Discharge Exam: Filed Vitals:   06/24/14 0453  BP: 147/68  Pulse: 66  Temp: 98.3 F (36.8 C)  Resp: 16    General: Alert, orientated, appears younger than stated age.  Very pleasant. Cardiovascular: irreg irreg no m/r/g. Respiratory: non productive cough, no w/c/r, moderately good air movement. Abdomen:  Soft, nt, nd, +bs, no masses Extremities:  Trace edema, 5/5 strength in each.  Discharge Instructions  Discharge Instructions   Diet - low sodium heart healthy    Complete by:  As directed      Increase activity slowly    Complete by:  As directed             Medication List    STOP taking  these medications       amoxicillin 500 MG capsule  Commonly known as:  AMOXIL     lisinopril 10 MG tablet  Commonly known as:  PRINIVIL,ZESTRIL      TAKE these medications       albuterol 108 (90 BASE) MCG/ACT inhaler  Commonly known as:  PROVENTIL HFA;VENTOLIN HFA  Inhale 2 puffs into the lungs every 6 (six) hours as needed for wheezing or shortness of breath.     azithromycin 500 MG tablet  Commonly known as:  ZITHROMAX  Take 1 tablet (500 mg total) by mouth daily.     cloNIDine 0.1 MG tablet  Commonly known as:  CATAPRES  Take 1 tablet (0.1 mg total) by mouth daily.     desloratadine 5 MG tablet  Commonly known as:  CLARINEX  Take 5 mg by mouth daily.     diltiazem 60 MG tablet  Commonly known as:  CARDIZEM  Take 60 mg by mouth 2 (two) times daily.     ferrous sulfate 325 (65 FE) MG tablet  Take 325 mg by mouth 2 (two) times daily.     fluticasone 50 MCG/ACT nasal spray  Commonly known as:  FLONASE  Place 1 spray into both nostrils daily.     furosemide 20 MG tablet  Commonly known as:  LASIX  Take 2 tablets (40 mg total) by mouth daily.     glipiZIDE 10 MG tablet  Commonly known as:  GLUCOTROL  Take 10 mg by mouth 2 (two) times daily.     hydrALAZINE 50 MG tablet  Commonly known as:  APRESOLINE  Take 50 mg by mouth as directed. 2 TABLETS TWICE DAILY     HYDROcodone-homatropine 5-1.5 MG/5ML syrup  Commonly known as:  HYCODAN  Take 5 mLs by mouth every 6 (six) hours as needed for cough.     ipratropium-albuterol 0.5-2.5 (3) MG/3ML Soln  Commonly known as:  DUONEB  Take 3 mLs by nebulization every 2 (two) hours as needed (wheeze, shortness of breath.).     labetalol 200 MG tablet  Commonly known as:  NORMODYNE  Take 1 tablet (200 mg total) by mouth 2 (two) times daily.     LEVEMIR FLEXPEN 100 UNIT/ML Pen  Generic drug:  Insulin Detemir  Inject 10 Units into the skin daily at 10 pm.     lovastatin 40 MG tablet  Commonly known as:  MEVACOR  Take 1  tablet (40 mg total) by mouth at bedtime.     multivitamin with minerals Tabs tablet  Take 1 tablet by mouth daily.     pantoprazole 40 MG tablet  Commonly known as:  PROTONIX  Take 40 mg by mouth daily.     predniSONE 10 MG tablet  Commonly known as:  DELTASONE  Take 5 tablets (50 mg total) by mouth daily with breakfast. Take 5 tabs with breakfast for 2 days, then 4 tabs with breakfast for 2 days, then 3 tabs with breakfast for 2 days then 2 tabs with breakfast for 2 days, then 1 tab with breakfast for 2 days, then stop.     REFRESH OP  Place 1 drop into both eyes  2 (two) times daily as needed (for dry eyes).     TYLENOL PO  Take 2 tablets by mouth daily as needed (for headache).      ASK your doctor about these medications       warfarin 2 MG tablet  Commonly known as:  COUMADIN  Take 5 mg by mouth daily.       Allergies  Allergen Reactions  . Levaquin [Levofloxacin]   . Sulfa Antibiotics    Follow-up Information   Follow up with Gennette Pac, MD In 1 week. (for hospital follow up and further respiratory issues.)    Specialty:  Family Medicine   Contact information:   [place] [address] [phone]       Follow up with Sherril Croon, MD. (See next Tuesday as previously scheduled for Acute on Chronic renal failure and BP medications.)    Specialty:  Nephrology   Contact information:   [place] [address] [phone]        The results of significant diagnostics from this hospitalization (including imaging, microbiology, ancillary and laboratory) are listed below for reference.    Significant Diagnostic Studies: Dg Chest Portable 1 View  06/23/2014   CLINICAL DATA:  Shortness of breath.  EXAM: PORTABLE CHEST - 1 VIEW  COMPARISON:  04/04/2014.  FINDINGS: The heart is borderline enlarged but stable. There is tortuosity, ectasia and calcification of the thoracic aorta. Mild chronic lung changes but no acute pulmonary  findings. No pleural effusion. The bony thorax is intact.  IMPRESSION: Stable mild cardiac enlargement and tortuous ectatic and calcified thoracic aorta.  Mild chronic bronchitic type lung changes but no acute pulmonary findings.   Electronically Signed   By: Kalman Jewels M.D.   On: 06/23/2014 18:15   Labs: Basic Metabolic Panel:  Recent Labs Lab 06/23/14 1715 06/24/14 0403  NA 138 136*  K 4.2 4.1  CL 99 97  CO2 24 24  GLUCOSE 149* 275*  BUN 23 31*  CREATININE 1.62* 1.88*  CALCIUM 9.8 9.4   Liver Function Tests:  Recent Labs Lab 06/23/14 1715  AST 20  ALT 14  ALKPHOS 64  BILITOT 0.6  PROT 6.7  ALBUMIN 4.0   CBC:  Recent Labs Lab 06/23/14 1715 06/24/14 0403  WBC 7.3 5.2  NEUTROABS 5.6  --   HGB 11.7* 11.2*  HCT 35.5* 33.9*  MCV 83.7 82.9  PLT 152 164   Cardiac Enzymes:  Recent Labs Lab 06/23/14 1715  TROPONINI <0.30   BNP: BNP (last 3 results)  Recent Labs  06/23/14 1715  PROBNP 2207.0*   CBG:  Recent Labs Lab 06/23/14 2106 06/24/14 0833 06/24/14 1244  GLUCAP 198* 225* 193*       Signed:  Karen Kitchens [phone]  [facility] 06/24/2014, 1:27 PM

## 2014-06-24 NOTE — Progress Notes (Signed)
Discharge instructions and prescriptions given to daughter, [name].  PIV removed.  Pt taken to discharge location via wheelchair.

## 2014-06-24 NOTE — Discharge Summary (Signed)
Addendum  Patient seen and examined, chart and data base reviewed.  I agree with the above assessment and plan.  For full details please see Mrs. Imogene Burn PA note.  Acute bronchitis, feeling much better, discharged home on azithromycin.   Birdie Hopes, MD [facility] Pager: [phone] 06/24/2014, 5:13 PM

## 2014-06-24 NOTE — Care Management Note (Signed)
    Page 1 of 1   06/24/2014     3:42:10 PM CARE MANAGEMENT NOTE 06/24/2014  Patient:  Ariel Avila   Account Number:  [id number]  Date Initiated:  06/24/2014  Documentation initiated by:  Ariel Avila  Subjective/Objective Assessment:   dx copd ex  admit- lives with family.     Action/Plan:   Anticipated DC Date:  06/24/2014   Anticipated DC Plan:  [place]  CM consult      Choice offered to / List presented to:             Status of service:  Completed, signed off Medicare Important Message given?  NA - LOS <3 / Initial given by admissions (If response is "NO", the following Medicare IM given date fields will be blank) Date Medicare IM given:   Date Additional Medicare IM given:    Discharge Disposition:  HOME/SELF CARE  Per UR Regulation:  Reviewed for med. necessity/level of care/duration of stay  If discussed at [place] of Stay Meetings, dates discussed:    Comments:  06/24/14 Ariel Avila, BSN [phone] patient lives with family, pta indep. NCM did benefits check for xarelto, MD has decided to keep paitent on coumadin.

## 2014-06-24 NOTE — Progress Notes (Signed)
Pt arrived on unit, alert and oriented x4. Able to make needs known. Appeared to be in no distress. SOB with exertion noted, on oxygen at 2LPM. RAC IV clean, dry and intact. Vital signs taken and stable. Oriented to room and staff. Pt care guide provided. Bed at its lowest position. Call light place within reached. On tele box 20. No complaints made at this time. We will continue to monitor.

## 2014-06-25 ENCOUNTER — Telehealth: Payer: Self-pay

## 2014-06-25 NOTE — Telephone Encounter (Signed)
Pt daughter called into office, pt was admitted to hospital on 06/23/14 dx SOB, discharged on 06/24/14 with Prednisone taper 50mg  x 2, 40mg  x 2, 30mg  x 2, 20mg  x 2, 10mg  x 2.  Pt was previously on Prednisone taper 03/2014 INR elevated.  Pt's normal dosage of Coumadin is 5mg  daily.  Pt's INR yesterday at discharge was 1.97.  Advised to have pt take 5mg /2.5mg  alternating starting today and keep recheck appt scheduled for 06/29/14.

## 2014-06-29 ENCOUNTER — Ambulatory Visit (INDEPENDENT_AMBULATORY_CARE_PROVIDER_SITE_OTHER): Payer: Medicare Other | Admitting: *Deleted

## 2014-06-29 DIAGNOSIS — Z5181 Encounter for therapeutic drug level monitoring: Secondary | ICD-10-CM | POA: Diagnosis not present

## 2014-06-29 DIAGNOSIS — I4891 Unspecified atrial fibrillation: Secondary | ICD-10-CM | POA: Diagnosis not present

## 2014-06-29 DIAGNOSIS — D631 Anemia in chronic kidney disease: Secondary | ICD-10-CM | POA: Diagnosis not present

## 2014-06-29 DIAGNOSIS — N039 Chronic nephritic syndrome with unspecified morphologic changes: Secondary | ICD-10-CM | POA: Diagnosis not present

## 2014-06-29 DIAGNOSIS — N183 Chronic kidney disease, stage 3 unspecified: Secondary | ICD-10-CM | POA: Diagnosis not present

## 2014-06-29 DIAGNOSIS — N2581 Secondary hyperparathyroidism of renal origin: Secondary | ICD-10-CM | POA: Diagnosis not present

## 2014-06-29 LAB — POCT INR: INR: 1.7

## 2014-07-01 ENCOUNTER — Encounter: Payer: Self-pay | Admitting: Interventional Cardiology

## 2014-07-01 DIAGNOSIS — I10 Essential (primary) hypertension: Secondary | ICD-10-CM | POA: Diagnosis not present

## 2014-07-01 DIAGNOSIS — E1129 Type 2 diabetes mellitus with other diabetic kidney complication: Secondary | ICD-10-CM | POA: Diagnosis not present

## 2014-07-01 DIAGNOSIS — I4891 Unspecified atrial fibrillation: Secondary | ICD-10-CM | POA: Diagnosis not present

## 2014-07-01 DIAGNOSIS — I5032 Chronic diastolic (congestive) heart failure: Secondary | ICD-10-CM | POA: Diagnosis not present

## 2014-07-01 DIAGNOSIS — J45909 Unspecified asthma, uncomplicated: Secondary | ICD-10-CM | POA: Diagnosis not present

## 2014-07-06 ENCOUNTER — Encounter: Payer: Self-pay | Admitting: Pulmonary Disease

## 2014-07-06 ENCOUNTER — Encounter: Payer: Self-pay | Admitting: Nurse Practitioner

## 2014-07-06 ENCOUNTER — Ambulatory Visit (INDEPENDENT_AMBULATORY_CARE_PROVIDER_SITE_OTHER): Payer: Medicare Other | Admitting: Pulmonary Disease

## 2014-07-06 ENCOUNTER — Ambulatory Visit (INDEPENDENT_AMBULATORY_CARE_PROVIDER_SITE_OTHER): Payer: Medicare Other | Admitting: Pharmacist

## 2014-07-06 ENCOUNTER — Other Ambulatory Visit: Payer: Medicare Other

## 2014-07-06 ENCOUNTER — Ambulatory Visit (INDEPENDENT_AMBULATORY_CARE_PROVIDER_SITE_OTHER): Payer: Medicare Other | Admitting: Nurse Practitioner

## 2014-07-06 VITALS — BP 140/62 | HR 62 | Ht 64.0 in | Wt 151.0 lb

## 2014-07-06 VITALS — BP 132/64 | HR 67 | Temp 98.0°F | Ht 63.0 in | Wt 152.4 lb

## 2014-07-06 DIAGNOSIS — I5032 Chronic diastolic (congestive) heart failure: Secondary | ICD-10-CM

## 2014-07-06 DIAGNOSIS — I10 Essential (primary) hypertension: Secondary | ICD-10-CM | POA: Diagnosis not present

## 2014-07-06 DIAGNOSIS — I480 Paroxysmal atrial fibrillation: Secondary | ICD-10-CM

## 2014-07-06 DIAGNOSIS — I4891 Unspecified atrial fibrillation: Secondary | ICD-10-CM

## 2014-07-06 DIAGNOSIS — J209 Acute bronchitis, unspecified: Secondary | ICD-10-CM

## 2014-07-06 DIAGNOSIS — Z5181 Encounter for therapeutic drug level monitoring: Secondary | ICD-10-CM

## 2014-07-06 LAB — POCT INR: INR: 2.1

## 2014-07-06 NOTE — Progress Notes (Signed)
[place] Date of Birth: Oct 06, 1930 Medical Record #[id number]  History of Present Illness: Ms. Blasing is seen back today for a post hospital visit. Seen for Dr. Tamala Julian. She is an 80 year old female - recently moved back here from [place]. She has PAF, normal LV function, chronic diastolic HF, HTN, DM, CKD stage III-IV and hyperaldosteronism. She has been on chronic anticoagulation.   Last seen here in the office back in November - seemed to be doing ok.  Admitted towards the end of June with bronchitis - associated with diastolic HF and AF. Treated with steroids and antibiotics. BNP up and Lasix was increased. Echo was non-revealing. ACE was stopped after discussion by phone with Dr. Justin Mend. Lasix increased. Her rate was controlled and her anticoagulation was continued. Noted that this event started with a sore throat and then a cough with shortness of breath/palpitations.   Comes back today. Here with her daughter, [name] - who I remember from [facility] SICU. Arion feels better. Less shortness of breath. Really watching her salt now. No chest pain. Has had some PAF - but notes that this is improving as well. Not dizzy or lightheaded. BP labile which is chronic. Sometimes as low as 937 systolic and then up to 169'C. On more clonidine now. Has seen her PCP last week and had lab - BNP down to 178 and creatinine stable at 1.8. Seeing Pulmonary later today since this is her 2nd exacerbation over the past few months - ?allergies. Overall, she feels improved.    Current Outpatient Prescriptions  Medication Sig Dispense Refill  . Acetaminophen (TYLENOL PO) Take 2 tablets by mouth daily as needed (for headache).      Marland Kitchen albuterol (PROVENTIL HFA;VENTOLIN HFA) 108 (90 BASE) MCG/ACT inhaler Inhale 2 puffs into the lungs every 6 (six) hours as needed for wheezing or shortness of breath.  1 Inhaler  2  . cloNIDine (CATAPRES) 0.1 MG tablet Take 0.2 mg by mouth daily.      Marland Kitchen desloratadine (CLARINEX) 5 MG tablet  Take 5 mg by mouth daily.      Marland Kitchen diltiazem (CARDIZEM) 60 MG tablet Take 60 mg by mouth 2 (two) times daily.      . ferrous sulfate 325 (65 FE) MG tablet Take 325 mg by mouth 2 (two) times daily.       . fluticasone (FLONASE) 50 MCG/ACT nasal spray Place 1 spray into both nostrils daily.       . furosemide (LASIX) 20 MG tablet Take 2 tablets (40 mg total) by mouth daily.  60 tablet  3  . glipiZIDE (GLUCOTROL) 10 MG tablet Take 10 mg by mouth 2 (two) times daily.       . hydrALAZINE (APRESOLINE) 50 MG tablet Take 100 mg by mouth 3 (three) times daily. 2 TABLETS TWICE DAILY      . HYDROcodone-homatropine (HYCODAN) 5-1.5 MG/5ML syrup Take 5 mLs by mouth every 6 (six) hours as needed for cough.  120 mL  0  . Insulin Detemir (LEVEMIR FLEXPEN) 100 UNIT/ML Pen Inject 10 Units into the skin daily at 10 pm.      . ipratropium-albuterol (DUONEB) 0.5-2.5 (3) MG/3ML SOLN Take 3 mLs by nebulization every 2 (two) hours as needed (wheeze, shortness of breath.).  360 mL  6  . labetalol (NORMODYNE) 200 MG tablet Take 300 mg by mouth 2 (two) times daily.      Marland Kitchen lovastatin (MEVACOR) 40 MG tablet Take 1 tablet (40 mg total) by mouth  at bedtime.  30 tablet  10  . Multiple Vitamin (MULTIVITAMIN WITH MINERALS) TABS tablet Take 1 tablet by mouth daily.      . pantoprazole (PROTONIX) 40 MG tablet Take 40 mg by mouth daily.      . Polyvinyl Alcohol-Povidone (REFRESH OP) Place 1 drop into both eyes 2 (two) times daily as needed (for dry eyes).      . predniSONE (DELTASONE) 50 MG tablet Take 60 mg by mouth daily with breakfast. Taper dose      . warfarin (COUMADIN) 2 MG tablet Take 5 mg by mouth daily.        No current facility-administered medications for this visit.    Allergies  Allergen Reactions  . Levaquin [Levofloxacin]   . Sulfa Antibiotics     Past Medical History  Diagnosis Date  . Hypertension   . Allergy   . GERD (gastroesophageal reflux disease)   . Hyperlipidemia   . Osteoporosis   . Chronic  bronchitis     "lately" (06/23/2014)  . Type II diabetes mellitus   . H/O hiatal hernia   . Arthritis     "fingers and back" (06/23/2014)  . Chronic kidney disease (CKD), stage III (moderate)   . Basal cell carcinoma     Past Surgical History  Procedure Laterality Date  . Abdominal hysterectomy    . Appendectomy    . Basal cell carcinoma excision      "face and nose"    History  Smoking status  . Never Smoker   Smokeless tobacco  . Never Used    History  Alcohol Use No    Family History  Problem Relation Age of Onset  . Tuberculosis Mother   . Heart disease Father   . Heart disease Brother     Review of Systems: The review of systems is per the HPI.  All other systems were reviewed and are negative.  Physical Exam: BP 140/62  Pulse 62  Ht 5\' 4"  (1.626 m)  Wt 151 lb (68.493 kg)  BMI 25.91 kg/m2  SpO2 96% Patient is very pleasant and in no acute distress. Skin is warm and dry. Color is normal.  HEENT is unremarkable. Normocephalic/atraumatic. PERRL. Sclera are nonicteric. Neck is supple. No masses. No JVD. Lungs are clear. Cardiac exam shows a regular rate and rhythm. Abdomen is soft. Extremities are without edema. Gait and ROM are intact. No gross neurologic deficits noted.  Wt Readings from Last 3 Encounters:  07/06/14 151 lb (68.493 kg)  06/23/14 150 lb 5.7 oz (68.2 kg)  04/04/14 154 lb (69.854 kg)    LABORATORY DATA/PROCEDURES:  Lab Results  Component Value Date   WBC 5.2 06/24/2014   HGB 11.2* 06/24/2014   HCT 33.9* 06/24/2014   PLT 164 06/24/2014   GLUCOSE 275* 06/24/2014   ALT 14 06/23/2014   AST 20 06/23/2014   NA 136* 06/24/2014   K 4.1 06/24/2014   CL 97 06/24/2014   CREATININE 1.88* 06/24/2014   BUN 31* 06/24/2014   CO2 24 06/24/2014   INR 2.1 07/06/2014   Lab Results  Component Value Date   INR 2.1 07/06/2014   INR 1.7 06/29/2014   INR 1.97* 06/24/2014    BNP (last 3 results)  Recent Labs  06/23/14 1715  PROBNP 2207.0*   Echo Study  Conclusions from June 2015  - Left ventricle: The cavity size was normal. There was mild concentric hypertrophy. Systolic function was normal. The estimated ejection fraction was in the range  of 55% to 60%. Wall motion was normal; there were no regional wall motion abnormalities. - Mitral valve: There was mild regurgitation. - Left atrium: The atrium was mildly dilated. - Tricuspid valve: There was trivial regurgitation. - Pulmonic valve: There was trivial regurgitation.    Assessment / Plan:  1. Recent admission for bronchitis reported associated with AF and diastolic HF -  Her EKGs from that admission both showed sinus rhythm but rhythm strips with probably AF - short lived. She seems to be improved. Understands the need for salt restriction. Seeing pulmonary later today. Has normal LV function. No change in current treatment plan. She is getting a set of scales today to start daily weights.   2. HTN - no longer on ACE due to CKD -  BP labile - I have left her on her current regimen.   3. CKD - followed by Dr. Justin Mend -  No longer on her ACE.   4. Chronic coumadin anticoagulation - rechecking today.   I would like to see her back in 6 weeks to just check on her. No change in her current regimen. Understands the need for salt restriction/daily weights, etc.   Sees Dr. Tamala Julian in November. Seeing pulmonary later today.   Patient is agreeable to this plan and will call if any problems develop in the interim.   Burtis Junes, RN, [facility] [address] [place], [place]  [phone]

## 2014-07-06 NOTE — Patient Instructions (Signed)
You may have had acute bronchitis which worsened your heart condition Allergy blood test today Watch the salt in your diet Taper prednisone to off Call if worse or if cough persists Stay off lisinopril

## 2014-07-06 NOTE — Progress Notes (Signed)
Subjective:    Patient ID: Ariel Avila, female    DOB: [birth year], 80 y.o.   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 pmh- atrial fibrillation (Hank smith) , CK D (webb)., type 2 diabetes She moved to [place] [place] after retirement for 11 years and returned to [place] in November 2014.  She reports an episode of bronchitis around Easter that required steroids and antibiotics. She was again admitted in 6/24-252015 for 'acute bronchitis in the setting of diastolic-heart failure.'  She was treated with IV steroids, IV azithromycin, oxygen and Nebulizers CXR showed mild cardiomegaly and increased pulmonary vascular prominence.  BNP was elevated at 2200. 2D echo showed normal EF. Pulmonary pressures were not elevated. Patient also reports allergies since moving to [place].  Given her elevation in creatinine , lisinopril was discontinued. She was discharged on Lasix 40 mg. On followup appointment with her PCP, mild bronchospasm was noted with continued coughing, hence prednisone was extended to taper over 8 days. Dr. Justin Mend changed Coreg to labetalol She reports significant improvement over the last few days, coughing has subsided. Dyspnea is much improved. She uses duo nebs occasionally and albuterol MDI about once daily. She's able to ambulate longer distances. There is no childhood history of asthma, I was unable to elicit any change in her immediate environment    Past Medical History  Diagnosis Date  . Hypertension   . Allergy   . GERD (gastroesophageal reflux disease)   . Hyperlipidemia   . Osteoporosis   . Chronic bronchitis     "lately" (06/23/2014)  . Type II diabetes mellitus   . H/O hiatal hernia   . Arthritis     "fingers and back" (06/23/2014)  . Chronic kidney disease (CKD), stage III (moderate)   . Basal cell  carcinoma    Past Surgical History  Procedure Laterality Date  . Abdominal hysterectomy    . Appendectomy    . Basal cell carcinoma excision      "face and nose"    Allergies  Allergen Reactions  . Levaquin [Levofloxacin]   . Sulfa Antibiotics     History   Social History  . Marital Status: Married    Spouse Name: N/A    Number of Children: N/A  . Years of Education: N/A   Occupational History  . Not on file.   Social History Main Topics  . Smoking status: Never Smoker   . Smokeless tobacco: Never Used  . Alcohol Use: No  . Drug Use: No  . Sexual Activity: No   Other Topics Concern  . Not on file   Social History Narrative  . No narrative on file    Family History  Problem Relation Age of Onset  . Tuberculosis Mother   . Heart disease Father   . Heart disease Brother      Review of Systems  Constitutional: Negative for fever and unexpected weight change.  HENT: Positive for congestion. Negative for dental problem, ear pain, nosebleeds, postnasal drip, rhinorrhea, sinus pressure, sneezing, sore throat and trouble swallowing.   Eyes: Negative for redness and itching.  Respiratory: Positive for cough. Negative for chest tightness, shortness of breath and wheezing.   Cardiovascular: Negative for palpitations and leg swelling.  Gastrointestinal: Negative for nausea and vomiting.  Genitourinary: Negative for dysuria.  Musculoskeletal: Negative for joint swelling.  Skin: Negative for  rash.  Neurological: Negative for headaches.  Hematological: Does not bruise/bleed easily.  Psychiatric/Behavioral: Negative for dysphoric mood. The patient is not nervous/anxious.        Objective:   Physical Exam Gen. Pleasant, well-nourished, in no distress, normal affect ENT - no lesions, no post nasal drip Neck: No JVD, no thyromegaly, no carotid bruits Lungs: no use of accessory muscles, no dullness to percussion, clear without rales or rhonchi  Cardiovascular: Rhythm  regular, heart sounds  normal, no murmurs or gallops, no peripheral edema Abdomen: soft and non-tender, no hepatosplenomegaly, BS normal. Musculoskeletal: No deformities, no cyanosis or clubbing Neuro:  alert, non focal        Assessment & Plan:

## 2014-07-06 NOTE — Patient Instructions (Addendum)
Stay on your current medicines  Weigh yourself each morning and record.  Limit sodium intake. Goal is to have less than 1500 mg (1.5gm) of salt per day.  See me in 6 weeks  Call the [place] office at [phone] if you have any questions, problems or concerns.

## 2014-07-06 NOTE — Assessment & Plan Note (Signed)
may have had acute bronchitis which worsened diastolic heart failure. Allergy blood test today Watch the salt in your diet Taper prednisone to off Call if worse or if cough persists - if so we will proceed with pulmonary function testing. She has not tolerated Advair in the past,  Hence I will try to avoid laba Stay off lisinopril

## 2014-07-08 ENCOUNTER — Institutional Professional Consult (permissible substitution): Payer: PRIVATE HEALTH INSURANCE | Admitting: Internal Medicine

## 2014-07-08 LAB — ALLERGY FULL PROFILE
Allergen, D pternoyssinus,d7: <0.1 kU/L
Aspergillus fumigatus, m3: <0.1 kU/L
Bahia Grass: <0.1 kU/L
Bermuda Grass: <0.1 kU/L
Candida Albicans: <0.1 kU/L
Common Ragweed: <0.1 kU/L
Curvularia lunata: <0.1 kU/L
D. farinae: <0.1 kU/L
Dog Dander: <0.1 kU/L
Elm IgE: <0.1 kU/L
Fescue: <0.1 kU/L
G009 Red Top: <0.1 kU/L
House Dust Hollister: <0.1 kU/L
IGE (IMMUNOGLOBULIN E), SERUM: 12.2 [IU]/mL (ref 0.0–180.0)
Oak: <0.1 kU/L
Timothy Grass: <0.1 kU/L

## 2014-07-22 ENCOUNTER — Ambulatory Visit (INDEPENDENT_AMBULATORY_CARE_PROVIDER_SITE_OTHER): Payer: Medicare Other | Admitting: Pharmacist

## 2014-07-22 DIAGNOSIS — I4891 Unspecified atrial fibrillation: Secondary | ICD-10-CM

## 2014-07-22 DIAGNOSIS — Z5181 Encounter for therapeutic drug level monitoring: Secondary | ICD-10-CM | POA: Diagnosis not present

## 2014-07-22 LAB — POCT INR: INR: 3

## 2014-08-06 DIAGNOSIS — H2510 Age-related nuclear cataract, unspecified eye: Secondary | ICD-10-CM | POA: Diagnosis not present

## 2014-08-06 DIAGNOSIS — E1139 Type 2 diabetes mellitus with other diabetic ophthalmic complication: Secondary | ICD-10-CM | POA: Diagnosis not present

## 2014-08-06 DIAGNOSIS — H01009 Unspecified blepharitis unspecified eye, unspecified eyelid: Secondary | ICD-10-CM | POA: Diagnosis not present

## 2014-08-06 DIAGNOSIS — E11319 Type 2 diabetes mellitus with unspecified diabetic retinopathy without macular edema: Secondary | ICD-10-CM | POA: Diagnosis not present

## 2014-08-17 ENCOUNTER — Encounter: Payer: Self-pay | Admitting: Nurse Practitioner

## 2014-08-17 ENCOUNTER — Ambulatory Visit (INDEPENDENT_AMBULATORY_CARE_PROVIDER_SITE_OTHER): Payer: Medicare Other | Admitting: Pharmacist Clinician (PhC)/ Clinical Pharmacy Specialist

## 2014-08-17 ENCOUNTER — Ambulatory Visit (INDEPENDENT_AMBULATORY_CARE_PROVIDER_SITE_OTHER): Payer: Medicare Other | Admitting: Nurse Practitioner

## 2014-08-17 VITALS — BP 128/62 | HR 65 | Ht 64.5 in | Wt 156.0 lb

## 2014-08-17 DIAGNOSIS — I4891 Unspecified atrial fibrillation: Secondary | ICD-10-CM

## 2014-08-17 DIAGNOSIS — I10 Essential (primary) hypertension: Secondary | ICD-10-CM

## 2014-08-17 DIAGNOSIS — I480 Paroxysmal atrial fibrillation: Secondary | ICD-10-CM

## 2014-08-17 DIAGNOSIS — R0609 Other forms of dyspnea: Secondary | ICD-10-CM | POA: Diagnosis not present

## 2014-08-17 DIAGNOSIS — Z5181 Encounter for therapeutic drug level monitoring: Secondary | ICD-10-CM

## 2014-08-17 DIAGNOSIS — R0989 Other specified symptoms and signs involving the circulatory and respiratory systems: Secondary | ICD-10-CM | POA: Diagnosis not present

## 2014-08-17 DIAGNOSIS — R0600 Dyspnea, unspecified: Secondary | ICD-10-CM

## 2014-08-17 DIAGNOSIS — I5032 Chronic diastolic (congestive) heart failure: Secondary | ICD-10-CM

## 2014-08-17 LAB — BASIC METABOLIC PANEL
BUN: 21 mg/dL (ref 6–23)
CO2: 26 mEq/L (ref 19–32)
Calcium: 9.3 mg/dL (ref 8.4–10.5)
Chloride: 108 mEq/L (ref 96–112)
Creatinine, Ser: 1.9 mg/dL — ABNORMAL HIGH (ref 0.4–1.2)
GFR: 26.22 mL/min — ABNORMAL LOW (ref >60.00)
Glucose, Bld: 203 mg/dL — ABNORMAL HIGH (ref 70–99)
Potassium: 3.9 mEq/L (ref 3.5–5.1)
Sodium: 144 mEq/L (ref 135–145)

## 2014-08-17 LAB — BRAIN NATRIURETIC PEPTIDE: Pro B Natriuretic peptide (BNP): 101 pg/mL — ABNORMAL HIGH (ref 0.0–100.0)

## 2014-08-17 LAB — POCT INR: INR: 2.5

## 2014-08-17 NOTE — Patient Instructions (Addendum)
Continue with your current medicines  Really watch your salt intake  Try to get a set of scales to weigh each day  For today and tomorrow - take an extra 20 mg of Lasix to get your weight back down  We will check lab today  See Dr. Tamala Julian in November  Call the [place] office at [phone] if you have any questions, problems or concerns.

## 2014-08-17 NOTE — Progress Notes (Signed)
Ariel Date of Birth: 03-20-1930 Medical Record #[id number]  History of Present Illness: Ariel Avila is seen back today for a 6 week check.  Seen for Dr. Tamala Julian. She is an 80 year old female - recently moved back here from [place]. She has PAF, normal LV function, chronic diastolic HF, HTN, DM, CKD stage III-IV and hyperaldosteronism. She has been on chronic anticoagulation.   Last seen here in the office back in November of 2014 - seemed to be doing ok.   Admitted towards the end of June with bronchitis - associated with diastolic HF and AF. Treated with steroids and antibiotics. BNP up and Lasix was increased. Echo was non-revealing. ACE was stopped after discussion by phone with Dr. Justin Mend. Lasix increased. Her rate was controlled and her anticoagulation was continued. Noted that this event started with a sore throat and then a cough with shortness of breath/palpitations.   I saw her back 6 weeks ago - she was doing ok for the most part.  Comes back today. Here alone today. Doing ok. Breathing is good. No chest pain. No palpitations. Pretty swollen yesterday - better today. Ate out several meals over the weekend. No extra Lasix taken. Has not gotten a set of scales yet. Sees Dr. Justin Mend later this month.   Current Outpatient Prescriptions  Medication Sig Dispense Refill  . Acetaminophen (TYLENOL PO) Take 2 tablets by mouth daily as needed (for headache).      Marland Kitchen albuterol (PROVENTIL HFA;VENTOLIN HFA) 108 (90 BASE) MCG/ACT inhaler Inhale 2 puffs into the lungs every 6 (six) hours as needed for wheezing or shortness of breath.  1 Inhaler  2  . cloNIDine (CATAPRES) 0.1 MG tablet Take 0.2 mg by mouth daily.      Marland Kitchen desloratadine (CLARINEX) 5 MG tablet Take 5 mg by mouth daily.      Marland Kitchen diltiazem (CARDIZEM) 60 MG tablet Take 60 mg by mouth 2 (two) times daily.      . DUREZOL 0.05 % EMUL Place into the left eye.       . ferrous sulfate 325 (65 FE) MG tablet Take 325 mg by mouth 2 (two) times  daily.       . fluticasone (FLONASE) 50 MCG/ACT nasal spray Place 1 spray into both nostrils daily.       . furosemide (LASIX) 20 MG tablet Take 2 tablets (40 mg total) by mouth daily.  60 tablet  3  . glipiZIDE (GLUCOTROL) 10 MG tablet Take 10 mg by mouth 2 (two) times daily.       . hydrALAZINE (APRESOLINE) 50 MG tablet Take 100 mg by mouth 3 (three) times daily. 2 TABLETS TWICE DAILY      . HYDROcodone-homatropine (HYCODAN) 5-1.5 MG/5ML syrup Take 5 mLs by mouth every 6 (six) hours as needed for cough.  120 mL  0  . Insulin Detemir (LEVEMIR FLEXPEN) 100 UNIT/ML Pen Inject 10 Units into the skin daily at 10 pm.      . ipratropium-albuterol (DUONEB) 0.5-2.5 (3) MG/3ML SOLN Take 3 mLs by nebulization every 2 (two) hours as needed (wheeze, shortness of breath.).  360 mL  6  . labetalol (NORMODYNE) 200 MG tablet Take 300 mg by mouth 2 (two) times daily.      Marland Kitchen lovastatin (MEVACOR) 40 MG tablet Take 1 tablet (40 mg total) by mouth at bedtime.  30 tablet  10  . Multiple Vitamin (MULTIVITAMIN WITH MINERALS) TABS tablet Take 1 tablet by mouth daily.      Marland Kitchen  pantoprazole (PROTONIX) 40 MG tablet Take 40 mg by mouth daily.      . Polyvinyl Alcohol-Povidone (REFRESH OP) Place 1 drop into both eyes 2 (two) times daily as needed (for dry eyes).      Marland Kitchen PROLENSA 0.07 % SOLN Place into the left eye.       Marland Kitchen VIGAMOX 0.5 % ophthalmic solution Place into the left eye.       . warfarin (COUMADIN) 2 MG tablet Take 5 mg by mouth daily.        No current facility-administered medications for this visit.    Allergies  Allergen Reactions  . Levaquin [Levofloxacin]   . Sulfa Antibiotics     Past Medical History  Diagnosis Date  . Hypertension   . Allergy   . GERD (gastroesophageal reflux disease)   . Hyperlipidemia   . Osteoporosis   . Chronic bronchitis     "lately" (06/23/2014)  . Type II diabetes mellitus   . H/O hiatal hernia   . Arthritis     "fingers and back" (06/23/2014)  . Chronic kidney disease  (CKD), stage III (moderate)   . Basal cell carcinoma     Past Surgical History  Procedure Laterality Date  . Abdominal hysterectomy    . Appendectomy    . Basal cell carcinoma excision      "face and nose"    History  Smoking status  . Never Smoker   Smokeless tobacco  . Never Used    History  Alcohol Use No    Family History  Problem Relation Age of Onset  . Tuberculosis Mother   . Heart disease Father   . Heart disease Brother     Review of Systems: The review of systems is per the HPI.  All other systems were reviewed and are negative.  Physical Exam: BP 128/62  Pulse 65  Ht 5' 4.5" (1.638 m)  Wt 156 lb (70.761 kg)  BMI 26.37 kg/m2  SpO2 96% Patient is very pleasant and in no acute distress. Her weight is up 4 pounds since last visit. Skin is warm and dry. Color is normal.  HEENT is unremarkable. Normocephalic/atraumatic. PERRL. Sclera are nonicteric. Neck is supple. No masses. No JVD. Lungs are clear. Cardiac exam shows a regular rate and rhythm. Soft outflow murmur.  Abdomen is soft. Extremities are without edema. Gait and ROM are intact. No gross neurologic deficits noted.  Wt Readings from Last 3 Encounters:  08/17/14 156 lb (70.761 kg)  07/06/14 152 lb 6.4 oz (69.128 kg)  07/06/14 151 lb (68.493 kg)    LABORATORY DATA/PROCEDURES: PENDING  Lab Results  Component Value Date   WBC 5.2 06/24/2014   HGB 11.2* 06/24/2014   HCT 33.9* 06/24/2014   PLT 164 06/24/2014   GLUCOSE 275* 06/24/2014   ALT 14 06/23/2014   AST 20 06/23/2014   NA 136* 06/24/2014   K 4.1 06/24/2014   CL 97 06/24/2014   CREATININE 1.88* 06/24/2014   BUN 31* 06/24/2014   CO2 24 06/24/2014   INR 3.0 07/22/2014    BNP (last 3 results)  Recent Labs  06/23/14 1715  PROBNP 2207.0*    Echo Study Conclusions from June 2015  - Left ventricle: The cavity size was normal. There was mild concentric hypertrophy. Systolic function was normal. The estimated ejection fraction was in the range of  55% to 60%. Wall motion was normal; there were no regional wall motion abnormalities. - Mitral valve: There was mild regurgitation. - Left  atrium: The atrium was mildly dilated. - Tricuspid valve: There was trivial regurgitation. - Pulmonic valve: There was trivial regurgitation.  Assessment / Plan:   1. Prior admission for bronchitis reported associated with AF and diastolic HF - Her EKGs from that admission both showed sinus rhythm but rhythm strips with probably AF - short lived.  No further issues with the bronchitis.  2. HTN - no longer on ACE due to CKD - BP ok today. I have left her on her current regimen.  3. CKD - followed by Dr. Justin Mend - No longer on her ACE.   4. Chronic coumadin anticoagulation - checking INR today  5. Diastolic HF - checking labs today. Weight is up - has had more salt this past weekend - understands the need to really restrict. I have asked her to take an extra Lasix today and tomorrow. [place] lab today as well.  See Dr. Tamala Julian in November.   Patient is agreeable to this plan and will call if any problems develop in the interim.   Burtis Junes, RN, [place] [address] [place], [place]  [address] [phone]

## 2014-08-25 DIAGNOSIS — E1139 Type 2 diabetes mellitus with other diabetic ophthalmic complication: Secondary | ICD-10-CM | POA: Diagnosis not present

## 2014-08-25 DIAGNOSIS — H2510 Age-related nuclear cataract, unspecified eye: Secondary | ICD-10-CM | POA: Diagnosis not present

## 2014-08-25 DIAGNOSIS — H2589 Other age-related cataract: Secondary | ICD-10-CM | POA: Diagnosis not present

## 2014-08-25 DIAGNOSIS — E11329 Type 2 diabetes mellitus with mild nonproliferative diabetic retinopathy without macular edema: Secondary | ICD-10-CM | POA: Diagnosis not present

## 2014-08-30 DIAGNOSIS — N183 Chronic kidney disease, stage 3 unspecified: Secondary | ICD-10-CM | POA: Diagnosis not present

## 2014-08-30 DIAGNOSIS — N039 Chronic nephritic syndrome with unspecified morphologic changes: Secondary | ICD-10-CM | POA: Diagnosis not present

## 2014-08-30 DIAGNOSIS — N2581 Secondary hyperparathyroidism of renal origin: Secondary | ICD-10-CM | POA: Diagnosis not present

## 2014-08-30 DIAGNOSIS — D631 Anemia in chronic kidney disease: Secondary | ICD-10-CM | POA: Diagnosis not present

## 2014-09-13 ENCOUNTER — Ambulatory Visit (INDEPENDENT_AMBULATORY_CARE_PROVIDER_SITE_OTHER): Payer: Medicare Other

## 2014-09-13 DIAGNOSIS — I4891 Unspecified atrial fibrillation: Secondary | ICD-10-CM

## 2014-09-13 DIAGNOSIS — Z5181 Encounter for therapeutic drug level monitoring: Secondary | ICD-10-CM | POA: Diagnosis not present

## 2014-09-13 LAB — POCT INR: INR: 3.2

## 2014-09-22 DIAGNOSIS — H2510 Age-related nuclear cataract, unspecified eye: Secondary | ICD-10-CM | POA: Diagnosis not present

## 2014-09-22 DIAGNOSIS — E11329 Type 2 diabetes mellitus with mild nonproliferative diabetic retinopathy without macular edema: Secondary | ICD-10-CM | POA: Diagnosis not present

## 2014-09-22 DIAGNOSIS — H2589 Other age-related cataract: Secondary | ICD-10-CM | POA: Diagnosis not present

## 2014-09-22 DIAGNOSIS — E1139 Type 2 diabetes mellitus with other diabetic ophthalmic complication: Secondary | ICD-10-CM | POA: Diagnosis not present

## 2014-10-01 DIAGNOSIS — S0502XA Injury of conjunctiva and corneal abrasion without foreign body, left eye, initial encounter: Secondary | ICD-10-CM | POA: Diagnosis not present

## 2014-10-01 DIAGNOSIS — H5712 Ocular pain, left eye: Secondary | ICD-10-CM | POA: Diagnosis not present

## 2014-10-01 DIAGNOSIS — T1512XA Foreign body in conjunctival sac, left eye, initial encounter: Secondary | ICD-10-CM | POA: Diagnosis not present

## 2014-10-01 DIAGNOSIS — H538 Other visual disturbances: Secondary | ICD-10-CM | POA: Diagnosis not present

## 2014-10-11 ENCOUNTER — Ambulatory Visit (INDEPENDENT_AMBULATORY_CARE_PROVIDER_SITE_OTHER): Payer: Medicare Other | Admitting: *Deleted

## 2014-10-11 DIAGNOSIS — Z5181 Encounter for therapeutic drug level monitoring: Secondary | ICD-10-CM | POA: Diagnosis not present

## 2014-10-11 DIAGNOSIS — I4891 Unspecified atrial fibrillation: Secondary | ICD-10-CM

## 2014-10-11 LAB — POCT INR: INR: 1.7

## 2014-10-14 DIAGNOSIS — Z23 Encounter for immunization: Secondary | ICD-10-CM | POA: Diagnosis not present

## 2014-10-21 DIAGNOSIS — E11339 Type 2 diabetes mellitus with moderate nonproliferative diabetic retinopathy without macular edema: Secondary | ICD-10-CM | POA: Diagnosis not present

## 2014-11-01 ENCOUNTER — Ambulatory Visit (INDEPENDENT_AMBULATORY_CARE_PROVIDER_SITE_OTHER): Payer: Medicare Other | Admitting: *Deleted

## 2014-11-01 DIAGNOSIS — Z5181 Encounter for therapeutic drug level monitoring: Secondary | ICD-10-CM

## 2014-11-01 DIAGNOSIS — I4891 Unspecified atrial fibrillation: Secondary | ICD-10-CM | POA: Diagnosis not present

## 2014-11-01 LAB — POCT INR: INR: 2.2

## 2014-11-10 DIAGNOSIS — N2581 Secondary hyperparathyroidism of renal origin: Secondary | ICD-10-CM | POA: Diagnosis not present

## 2014-11-10 DIAGNOSIS — D631 Anemia in chronic kidney disease: Secondary | ICD-10-CM | POA: Diagnosis not present

## 2014-11-10 DIAGNOSIS — N183 Chronic kidney disease, stage 3 (moderate): Secondary | ICD-10-CM | POA: Diagnosis not present

## 2014-11-12 DIAGNOSIS — B349 Viral infection, unspecified: Secondary | ICD-10-CM | POA: Diagnosis not present

## 2014-11-12 DIAGNOSIS — I5032 Chronic diastolic (congestive) heart failure: Secondary | ICD-10-CM | POA: Diagnosis not present

## 2014-11-18 DIAGNOSIS — E119 Type 2 diabetes mellitus without complications: Secondary | ICD-10-CM | POA: Diagnosis not present

## 2014-11-18 DIAGNOSIS — R0989 Other specified symptoms and signs involving the circulatory and respiratory systems: Secondary | ICD-10-CM | POA: Diagnosis not present

## 2014-11-18 DIAGNOSIS — I5032 Chronic diastolic (congestive) heart failure: Secondary | ICD-10-CM | POA: Diagnosis not present

## 2014-11-18 DIAGNOSIS — I482 Chronic atrial fibrillation: Secondary | ICD-10-CM | POA: Diagnosis not present

## 2014-11-18 DIAGNOSIS — Z7901 Long term (current) use of anticoagulants: Secondary | ICD-10-CM | POA: Diagnosis not present

## 2014-11-18 DIAGNOSIS — B349 Viral infection, unspecified: Secondary | ICD-10-CM | POA: Diagnosis not present

## 2014-11-18 DIAGNOSIS — I10 Essential (primary) hypertension: Secondary | ICD-10-CM | POA: Diagnosis not present

## 2014-11-18 DIAGNOSIS — E785 Hyperlipidemia, unspecified: Secondary | ICD-10-CM | POA: Diagnosis not present

## 2014-11-19 DIAGNOSIS — R0989 Other specified symptoms and signs involving the circulatory and respiratory systems: Secondary | ICD-10-CM | POA: Diagnosis not present

## 2014-11-19 DIAGNOSIS — Z794 Long term (current) use of insulin: Secondary | ICD-10-CM | POA: Diagnosis not present

## 2014-11-19 DIAGNOSIS — E119 Type 2 diabetes mellitus without complications: Secondary | ICD-10-CM | POA: Diagnosis not present

## 2014-11-19 DIAGNOSIS — B349 Viral infection, unspecified: Secondary | ICD-10-CM | POA: Diagnosis not present

## 2014-11-19 DIAGNOSIS — I482 Chronic atrial fibrillation: Secondary | ICD-10-CM | POA: Diagnosis not present

## 2014-11-22 ENCOUNTER — Ambulatory Visit (INDEPENDENT_AMBULATORY_CARE_PROVIDER_SITE_OTHER): Payer: Medicare Other

## 2014-11-22 ENCOUNTER — Ambulatory Visit (INDEPENDENT_AMBULATORY_CARE_PROVIDER_SITE_OTHER): Payer: Medicare Other | Admitting: Interventional Cardiology

## 2014-11-22 ENCOUNTER — Encounter: Payer: Self-pay | Admitting: Interventional Cardiology

## 2014-11-22 VITALS — BP 186/82 | HR 66 | Ht 64.5 in | Wt 152.8 lb

## 2014-11-22 DIAGNOSIS — I10 Essential (primary) hypertension: Secondary | ICD-10-CM

## 2014-11-22 DIAGNOSIS — I5032 Chronic diastolic (congestive) heart failure: Secondary | ICD-10-CM | POA: Diagnosis not present

## 2014-11-22 DIAGNOSIS — Z5181 Encounter for therapeutic drug level monitoring: Secondary | ICD-10-CM

## 2014-11-22 DIAGNOSIS — I480 Paroxysmal atrial fibrillation: Secondary | ICD-10-CM

## 2014-11-22 DIAGNOSIS — I4891 Unspecified atrial fibrillation: Secondary | ICD-10-CM | POA: Diagnosis not present

## 2014-11-22 LAB — POCT INR: INR: 1.9

## 2014-11-22 NOTE — Patient Instructions (Signed)
Your physician recommends that you continue on your current medications as directed. Please refer to the Current Medication list given to you today.  Your physician wants you to follow-up in: 1 year wit Dr.Smith You will receive a reminder letter in the mail two months in advance. If you don't receive a letter, please call our office to schedule the follow-up appointment.

## 2014-11-22 NOTE — Progress Notes (Signed)
Patient ID: Ariel Avila, female   DOB: July 11, 1930, 80 y.o.   MRN: [id number]    [address], [address] [place], [place]  [address] Phone: [phone] Fax:  [phone]  Date:  11/22/2014   ID:  Ariel Avila, DOB 04-30-1930, MRN [id number]  PCP:  Gennette Pac, MD   ASSESSMENT:  1. Paroxysmal atrial fibrillation, stable 2. Hypertension, essential, stable 3. Chronic diastolic heart failure, stable 4. Chronic kidney disease 4. Chronic anticoagulation therapy  PLAN:  1. Continue in the anticoagulation clinic 2. Call if prolonged palpitations or cardiovascular symptoms 3. No change in medical regimen   SUBJECTIVE: Ariel Avila is a 80 y.o. female physically by her husband. Her husband is developing some cognitive impairment. Overall patient is doing well. She has no cardiovascular complaints. His been no blood in her urine or stool. She denies episodes of syncope. She has not had angina.   Wt Readings from Last 3 Encounters:  11/22/14 152 lb 12.8 oz (69.31 kg)  08/17/14 156 lb (70.761 kg)  07/06/14 152 lb 6.4 oz (69.128 kg)     Past Medical History  Diagnosis Date  . Hypertension   . Allergy   . GERD (gastroesophageal reflux disease)   . Hyperlipidemia   . Osteoporosis   . Chronic bronchitis     "lately" (06/23/2014)  . Type II diabetes mellitus   . H/O hiatal hernia   . Arthritis     "fingers and back" (06/23/2014)  . Chronic kidney disease (CKD), stage III (moderate)   . Basal cell carcinoma     Current Outpatient Prescriptions  Medication Sig Dispense Refill  . Acetaminophen (TYLENOL PO) Take 2 tablets by mouth daily as needed (for headache).    . cloNIDine (CATAPRES) 0.1 MG tablet Take 0.2 mg by mouth daily.    Marland Kitchen desloratadine (CLARINEX) 5 MG tablet Take 5 mg by mouth daily.    Marland Kitchen diltiazem (CARDIZEM) 60 MG tablet Take 60 mg by mouth 2 (two) times daily.    . ferrous sulfate 325 (65 FE) MG tablet Take 325 mg by mouth 2 (two) times daily.     .  fluticasone (FLONASE) 50 MCG/ACT nasal spray Place 1 spray into both nostrils daily.     . furosemide (LASIX) 20 MG tablet Take 2 tablets (40 mg total) by mouth daily. 60 tablet 3  . glipiZIDE (GLUCOTROL) 10 MG tablet Take 10 mg by mouth 2 (two) times daily.     . hydrALAZINE (APRESOLINE) 50 MG tablet Take 100 mg by mouth 3 (three) times daily. 2 TABLETS TWICE DAILY    . HYDROcodone-homatropine (HYCODAN) 5-1.5 MG/5ML syrup Take 5 mLs by mouth every 6 (six) hours as needed for cough. 120 mL 0  . Insulin Detemir (LEVEMIR FLEXPEN) 100 UNIT/ML Pen Inject 10 Units into the skin daily at 10 pm.    . labetalol (NORMODYNE) 200 MG tablet Take 300 mg by mouth 2 (two) times daily.    Marland Kitchen lovastatin (MEVACOR) 40 MG tablet Take 1 tablet (40 mg total) by mouth at bedtime. 30 tablet 10  . Multiple Vitamin (MULTIVITAMIN WITH MINERALS) TABS tablet Take 1 tablet by mouth daily.    . pantoprazole (PROTONIX) 40 MG tablet Take 40 mg by mouth daily.    . Polyvinyl Alcohol-Povidone (REFRESH OP) Place 1 drop into both eyes 2 (two) times daily as needed (for dry eyes).    . warfarin (COUMADIN) 2 MG tablet Take 5 mg by mouth daily.  No current facility-administered medications for this visit.    Allergies:    Allergies  Allergen Reactions  . Levaquin [Levofloxacin]   . Sulfa Antibiotics     Social History:  The patient  reports that she has never smoked. She has never used smokeless tobacco. She reports that she does not drink alcohol or use illicit drugs.   ROS:  Please see the history of present illness.   Denies transient neurological complaints, edema, claudication, bleeding, weight loss, and anorexia.   All other systems reviewed and negative.   OBJECTIVE: VS:  BP 186/82 mmHg  Pulse 66  Ht 5' 4.5" (1.638 m)  Wt 152 lb 12.8 oz (69.31 kg)  BMI 25.83 kg/m2 Well nourished, well developed, in no acute distress, healthy appearing HEENT: normal Neck: JVD flat. Carotid bruit absent  Cardiac:  normal S1, S2;  RRR; 2/6 systolic murmur compatible with aortic outflow. Lungs:  clear to auscultation bilaterally, no wheezing, rhonchi or rales Abd: soft, nontender, no hepatomegaly Ext: Edema absent. Pulses 2+ Skin: warm and dry Neuro:  CNs 2-12 intact, no focal abnormalities noted  EKG:  No EKG is performed       Signed, Illene Labrador III, MD 11/22/2014 10:01 AM

## 2014-12-03 ENCOUNTER — Other Ambulatory Visit: Payer: Self-pay

## 2014-12-03 ENCOUNTER — Encounter (HOSPITAL_COMMUNITY): Payer: Self-pay | Admitting: *Deleted

## 2014-12-03 ENCOUNTER — Inpatient Hospital Stay (HOSPITAL_COMMUNITY)
Admission: EM | Admit: 2014-12-03 | Discharge: 2014-12-04 | DRG: 638 | Disposition: A | Discharge status: Home / Self Care | Payer: Medicare Other | Attending: Internal Medicine | Admitting: Internal Medicine

## 2014-12-03 DIAGNOSIS — Z85828 Personal history of other malignant neoplasm of skin: Secondary | ICD-10-CM

## 2014-12-03 DIAGNOSIS — R791 Abnormal coagulation profile: Secondary | ICD-10-CM | POA: Diagnosis present

## 2014-12-03 DIAGNOSIS — I480 Paroxysmal atrial fibrillation: Secondary | ICD-10-CM | POA: Diagnosis not present

## 2014-12-03 DIAGNOSIS — N183 Chronic kidney disease, stage 3 unspecified: Secondary | ICD-10-CM

## 2014-12-03 DIAGNOSIS — I129 Hypertensive chronic kidney disease with stage 1 through stage 4 chronic kidney disease, or unspecified chronic kidney disease: Secondary | ICD-10-CM | POA: Diagnosis present

## 2014-12-03 DIAGNOSIS — R197 Diarrhea, unspecified: Secondary | ICD-10-CM

## 2014-12-03 DIAGNOSIS — K219 Gastro-esophageal reflux disease without esophagitis: Secondary | ICD-10-CM

## 2014-12-03 DIAGNOSIS — Z794 Long term (current) use of insulin: Secondary | ICD-10-CM

## 2014-12-03 DIAGNOSIS — I5032 Chronic diastolic (congestive) heart failure: Secondary | ICD-10-CM | POA: Diagnosis not present

## 2014-12-03 DIAGNOSIS — K210 Gastro-esophageal reflux disease with esophagitis: Secondary | ICD-10-CM | POA: Diagnosis not present

## 2014-12-03 DIAGNOSIS — E162 Hypoglycemia, unspecified: Secondary | ICD-10-CM

## 2014-12-03 DIAGNOSIS — R1111 Vomiting without nausea: Secondary | ICD-10-CM | POA: Diagnosis not present

## 2014-12-03 DIAGNOSIS — Z882 Allergy status to sulfonamides status: Secondary | ICD-10-CM | POA: Diagnosis not present

## 2014-12-03 DIAGNOSIS — E119 Type 2 diabetes mellitus without complications: Secondary | ICD-10-CM | POA: Diagnosis not present

## 2014-12-03 DIAGNOSIS — E161 Other hypoglycemia: Secondary | ICD-10-CM | POA: Diagnosis not present

## 2014-12-03 DIAGNOSIS — Z7901 Long term (current) use of anticoagulants: Secondary | ICD-10-CM

## 2014-12-03 DIAGNOSIS — I10 Essential (primary) hypertension: Secondary | ICD-10-CM | POA: Diagnosis not present

## 2014-12-03 DIAGNOSIS — I482 Chronic atrial fibrillation: Secondary | ICD-10-CM | POA: Diagnosis not present

## 2014-12-03 DIAGNOSIS — E785 Hyperlipidemia, unspecified: Secondary | ICD-10-CM | POA: Diagnosis present

## 2014-12-03 DIAGNOSIS — K449 Diaphragmatic hernia without obstruction or gangrene: Secondary | ICD-10-CM | POA: Diagnosis present

## 2014-12-03 DIAGNOSIS — Z881 Allergy status to other antibiotic agents status: Secondary | ICD-10-CM | POA: Diagnosis not present

## 2014-12-03 DIAGNOSIS — R112 Nausea with vomiting, unspecified: Secondary | ICD-10-CM | POA: Diagnosis present

## 2014-12-03 DIAGNOSIS — E11649 Type 2 diabetes mellitus with hypoglycemia without coma: Secondary | ICD-10-CM | POA: Diagnosis not present

## 2014-12-03 DIAGNOSIS — M810 Age-related osteoporosis without current pathological fracture: Secondary | ICD-10-CM | POA: Diagnosis present

## 2014-12-03 HISTORY — DX: Paroxysmal atrial fibrillation: I48.0

## 2014-12-03 LAB — COMPREHENSIVE METABOLIC PANEL
ALT: 22 U/L (ref 0–35)
ANION GAP: 12 (ref 5–15)
AST: 30 U/L (ref 0–37)
Albumin: 4 g/dL (ref 3.5–5.2)
Alkaline Phosphatase: 76 U/L (ref 39–117)
BUN: 25 mg/dL — AB (ref 6–23)
CALCIUM: 9.7 mg/dL (ref 8.4–10.5)
CO2: 26 meq/L (ref 19–32)
Chloride: 102 mEq/L (ref 96–112)
Creatinine, Ser: 1.67 mg/dL — ABNORMAL HIGH (ref 0.50–1.10)
GFR, EST AFRICAN AMERICAN: 32 mL/min — AB (ref >90)
GFR, EST NON AFRICAN AMERICAN: 27 mL/min — AB (ref >90)
GLUCOSE: 44 mg/dL — AB (ref 70–99)
Potassium: 4.2 mEq/L (ref 3.7–5.3)
SODIUM: 140 meq/L (ref 137–147)
Total Bilirubin: 0.2 mg/dL — ABNORMAL LOW (ref 0.3–1.2)
Total Protein: 6.5 g/dL (ref 6.0–8.3)

## 2014-12-03 LAB — CBC WITH DIFFERENTIAL/PLATELET
Basophils Absolute: 0 10*3/uL (ref 0.0–0.1)
Basophils Relative: 1 % (ref 0–1)
EOS ABS: 0.1 10*3/uL (ref 0.0–0.7)
Eosinophils Relative: 2 % (ref 0–5)
HCT: 35.8 % — ABNORMAL LOW (ref 36.0–46.0)
HEMOGLOBIN: 12 g/dL (ref 12.0–15.0)
LYMPHS ABS: 1.1 10*3/uL (ref 0.7–4.0)
LYMPHS PCT: 24 % (ref 12–46)
MCH: 28 pg (ref 26.0–34.0)
MCHC: 33.5 g/dL (ref 30.0–36.0)
MCV: 83.4 fL (ref 78.0–100.0)
MONOS PCT: 9 % (ref 3–12)
Monocytes Absolute: 0.4 10*3/uL (ref 0.1–1.0)
Neutro Abs: 2.8 10*3/uL (ref 1.7–7.7)
Neutrophils Relative %: 64 % (ref 43–77)
PLATELETS: 185 10*3/uL (ref 150–400)
RBC: 4.29 MIL/uL (ref 3.87–5.11)
RDW: 14.2 % (ref 11.5–15.5)
WBC: 4.3 10*3/uL (ref 4.0–10.5)

## 2014-12-03 LAB — CBG MONITORING, ED
GLUCOSE-CAPILLARY: 73 mg/dL (ref 70–99)
Glucose-Capillary: 42 mg/dL — CL (ref 70–99)
Glucose-Capillary: 59 mg/dL — ABNORMAL LOW (ref 70–99)

## 2014-12-03 LAB — GLUCOSE, CAPILLARY
GLUCOSE-CAPILLARY: 93 mg/dL (ref 70–99)
Glucose-Capillary: 83 mg/dL (ref 70–99)

## 2014-12-03 LAB — URINALYSIS, ROUTINE W REFLEX MICROSCOPIC
BILIRUBIN URINE: NEGATIVE
Glucose, UA: NEGATIVE mg/dL
Hgb urine dipstick: NEGATIVE
Ketones, ur: NEGATIVE mg/dL
Leukocytes, UA: NEGATIVE
NITRITE: NEGATIVE
PROTEIN: NEGATIVE mg/dL
Specific Gravity, Urine: 1.003 — ABNORMAL LOW (ref 1.005–1.030)
UROBILINOGEN UA: 0.2 mg/dL (ref 0.0–1.0)
pH: 7 (ref 5.0–8.0)

## 2014-12-03 LAB — PROTIME-INR
INR: 3.53 — ABNORMAL HIGH (ref 0.00–1.49)
PROTHROMBIN TIME: 35.6 s — AB (ref 11.6–15.2)

## 2014-12-03 MED ORDER — SODIUM CHLORIDE 0.9 % IV SOLN
1000.0000 mL | Freq: Once | INTRAVENOUS | Status: AC
  Administered 2014-12-03: 1000 mL via INTRAVENOUS

## 2014-12-03 MED ORDER — DILTIAZEM HCL 60 MG PO TABS
60.0000 mg | ORAL_TABLET | Freq: Two times a day (BID) | ORAL | Status: DC
  Administered 2014-12-03 – 2014-12-04 (×2): 60 mg via ORAL
  Filled 2014-12-03 (×3): qty 1

## 2014-12-03 MED ORDER — SODIUM CHLORIDE 0.9 % IV BOLUS (SEPSIS)
1000.0000 mL | Freq: Once | INTRAVENOUS | Status: AC
  Administered 2014-12-03: 1000 mL via INTRAVENOUS

## 2014-12-03 MED ORDER — LORATADINE 10 MG PO TABS
10.0000 mg | ORAL_TABLET | Freq: Every day | ORAL | Status: DC
  Administered 2014-12-04: 10 mg via ORAL
  Filled 2014-12-03: qty 1

## 2014-12-03 MED ORDER — DEXTROSE-NACL 5-0.45 % IV SOLN
INTRAVENOUS | Status: DC
  Administered 2014-12-03: 22:00:00 via INTRAVENOUS

## 2014-12-03 MED ORDER — PANTOPRAZOLE SODIUM 40 MG PO TBEC
40.0000 mg | DELAYED_RELEASE_TABLET | Freq: Every day | ORAL | Status: DC
  Administered 2014-12-03 – 2014-12-04 (×2): 40 mg via ORAL
  Filled 2014-12-03: qty 1

## 2014-12-03 MED ORDER — ONDANSETRON HCL 4 MG PO TABS: 4.0000 mg | ORAL_TABLET | Freq: Four times a day (QID) | ORAL | Status: DC | PRN

## 2014-12-03 MED ORDER — DEXTROSE 5 % IV SOLN
Freq: Once | INTRAVENOUS | Status: AC
  Administered 2014-12-03: 17:00:00 via INTRAVENOUS

## 2014-12-03 MED ORDER — PRAVASTATIN SODIUM 40 MG PO TABS
40.0000 mg | ORAL_TABLET | Freq: Every day | ORAL | Status: DC
  Administered 2014-12-03: 40 mg via ORAL
  Filled 2014-12-03 (×2): qty 1

## 2014-12-03 MED ORDER — ACETAMINOPHEN 650 MG RE SUPP: 650.0000 mg | Freq: Four times a day (QID) | RECTAL | Status: DC | PRN

## 2014-12-03 MED ORDER — ONDANSETRON HCL 4 MG/2ML IJ SOLN: 4.0000 mg | Freq: Four times a day (QID) | INTRAMUSCULAR | Status: DC | PRN

## 2014-12-03 MED ORDER — HYDROCODONE-ACETAMINOPHEN 5-325 MG PO TABS: 1.0000 | ORAL_TABLET | ORAL | Status: DC | PRN

## 2014-12-03 MED ORDER — ACETAMINOPHEN 325 MG PO TABS: 650.0000 mg | ORAL_TABLET | Freq: Four times a day (QID) | ORAL | Status: DC | PRN

## 2014-12-03 MED ORDER — ONDANSETRON HCL 4 MG/2ML IJ SOLN
4.0000 mg | Freq: Once | INTRAMUSCULAR | Status: AC
  Administered 2014-12-03: 4 mg via INTRAVENOUS

## 2014-12-03 MED ORDER — ONDANSETRON HCL 4 MG/2ML IJ SOLN: 4.0000 mg | Freq: Once | INTRAMUSCULAR | Status: DC

## 2014-12-03 MED ORDER — FUROSEMIDE 40 MG PO TABS
40.0000 mg | ORAL_TABLET | Freq: Every day | ORAL | Status: DC
  Administered 2014-12-04: 40 mg via ORAL
  Filled 2014-12-03: qty 1

## 2014-12-03 MED ORDER — LABETALOL HCL 300 MG PO TABS
300.0000 mg | ORAL_TABLET | Freq: Two times a day (BID) | ORAL | Status: DC
Start: 2014-12-03 — End: 2014-12-04
  Administered 2014-12-03 – 2014-12-04 (×2): 300 mg via ORAL
  Filled 2014-12-03 (×3): qty 1

## 2014-12-03 NOTE — Progress Notes (Signed)
ANTICOAGULATION CONSULT NOTE - Initial Consult  Pharmacy Consult for Warfarin Indication: atrial fibrillation  Allergies  Allergen Reactions  . Levaquin [Levofloxacin]   . Sulfa Antibiotics     Patient Measurements:    Vital Signs: Temp: 98.5 F (36.9 C) (12/04 1902) Temp Source: Oral (12/04 1902) BP: 165/73 mmHg (12/04 1902) Pulse Rate: 86 (12/04 1902)  Labs:  Recent Labs  12/03/14 1642  HGB 12.0  HCT 35.8*  PLT 185  CREATININE 1.67*    Estimated Creatinine Clearance: 25.1 mL/min (by C-G formula based on Cr of 1.67).   Medical History: Past Medical History  Diagnosis Date  . Hypertension   . Allergy   . GERD (gastroesophageal reflux disease)   . Hyperlipidemia   . Osteoporosis   . Chronic bronchitis     "lately" (06/23/2014)  . Type II diabetes mellitus   . H/O hiatal hernia   . Arthritis     "fingers and back" (06/23/2014)  . Chronic kidney disease (CKD), stage III (moderate)   . Basal cell carcinoma   . Paroxysmal a-fib     Medications:  Scheduled:  . diltiazem  60 mg Oral BID  . [START ON 12/04/2014] furosemide  40 mg Oral Daily  . labetalol  300 mg Oral BID  . [START ON 12/04/2014] loratadine  10 mg Oral Daily  . ondansetron  4 mg Intravenous Once  . pantoprazole  40 mg Oral Daily  . pravastatin  40 mg Oral q1800   Infusions:  . dextrose 5 % and 0.45% NaCl     PRN: acetaminophen **OR** acetaminophen, HYDROcodone-acetaminophen, ondansetron **OR** ondansetron (ZOFRAN) IV  Assessment: 80 yo female admitted 12/4 with hypoglycemia. On chronic warfarin for afib. INR on 11/22/14 slightly subtherapeutic (1.9) attributed to a missed dose on 11/16/14 - instructed to take extra dose that day and continue same regimen which is 5mg  daily.  INR today = 3.53   Goal of Therapy:  INR 2-3   Plan:   Hold warfarin tonight  Daily PT/INR  Peggyann Juba, PharmD, BCPS Pager: [phone] 12/03/2014,8:31 PM

## 2014-12-03 NOTE — H&P (Signed)
[facility] History and Physical  Ariel Avila INO:[id number] DOB: 01/17/1930 DOA: 12/03/2014  Referring physician: Dr. Phylis Bougie - Gabriel Cirri PCP: Gennette Pac, MD   Chief Complaint: hypoglycemia  HPI: Ariel Avila is a 80 y.o. female  Developed hypoglycemia 24 hrs ago. Started feeling week and talking odd. Took glipizide despite lower glucose readings that day. EMS called and CBG 53 at that time. Given 1/2 amp D50 w/ improvement. Glucose has remained low since that time (ranging from 30-106). Hypoglycemia is worsening overall Developed diarrhea last night. 6 loose non-bloody non watery BMs in last 24hrs. Incontinent of stool a couple times.  Non-bilious emesis started today.  Last dose of levemir was last week.  Home reported A1c 5.6 She has not taken any medications today. Pt describes URI symptoms (predominantly cough) about 1 week ago and was on ABX   Review of Systems:  Constitutional:  No weight loss, night sweats, Fevers, chills HEENT:  Per HPI Cardio-vascular:  No chest pain, Orthopnea, PND, swelling in lower extremities, anasarca, dizziness, palpitations  GI:  ./per HPI Resp:  No shortness of breath with exertion or at rest. No change in color of mucus.No wheezing.No chest wall deformity  Skin:  no rash or lesions.  GU:  no dysuria, change in color of urine, no urgency or frequency. No flank pain.  Musculoskeletal:  No joint pain or swelling. No decreased range of motion. No back pain.  Psych:  No change in mood or affect. No depression or anxiety. No memory loss.   Past Medical History  Diagnosis Date  . Hypertension   . Allergy   . GERD (gastroesophageal reflux disease)   . Hyperlipidemia   . Osteoporosis   . Chronic bronchitis     "lately" (06/23/2014)  . Type II diabetes mellitus   . H/O hiatal hernia   . Arthritis     "fingers and back" (06/23/2014)  . Chronic kidney disease (CKD), stage III (moderate)   . Basal cell carcinoma   . Paroxysmal  a-fib    Past Surgical History  Procedure Laterality Date  . Abdominal hysterectomy    . Appendectomy    . Basal cell carcinoma excision      "face and nose"   Social History:  reports that she has never smoked. She has never used smokeless tobacco. She reports that she does not drink alcohol or use illicit drugs.  Allergies  Allergen Reactions  . Levaquin [Levofloxacin]   . Sulfa Antibiotics     Family History  Problem Relation Age of Onset  . Tuberculosis Mother   . Heart disease Father     bad valve  . Heart disease Brother     died at 86     Prior to Admission medications   Medication Sig Start Date End Date Taking? Authorizing Provider  Acetaminophen (TYLENOL PO) Take 2 tablets by mouth daily as needed (for headache).   Yes Historical Provider, MD  cloNIDine (CATAPRES) 0.2 MG tablet Take 0.2 mg by mouth daily.   Yes Historical Provider, MD  desloratadine (CLARINEX) 5 MG tablet Take 5 mg by mouth daily.   Yes Historical Provider, MD  diltiazem (CARDIZEM) 60 MG tablet Take 60 mg by mouth 2 (two) times daily.   Yes Historical Provider, MD  ferrous sulfate 325 (65 FE) MG tablet Take 325 mg by mouth 2 (two) times daily.    Yes Historical Provider, MD  fluticasone (FLONASE) 50 MCG/ACT nasal spray Place 1 spray into both nostrils daily.  Yes Historical Provider, MD  furosemide (LASIX) 20 MG tablet Take 2 tablets (40 mg total) by mouth daily. 06/24/14  Yes Marianne L York, PA-C  glipiZIDE (GLUCOTROL) 10 MG tablet Take 10 mg by mouth 2 (two) times daily.    Yes Historical Provider, MD  hydrALAZINE (APRESOLINE) 50 MG tablet Take 100 mg by mouth 3 (three) times daily. 2 TABLETS TWICE DAILY   Yes Historical Provider, MD  Insulin Detemir (LEVEMIR FLEXPEN) 100 UNIT/ML Pen Inject 10 Units into the skin daily at 10 pm.   Yes Historical Provider, MD  labetalol (NORMODYNE) 200 MG tablet Take 300 mg by mouth 2 (two) times daily. 03/26/14  Yes Belva Crome III, MD  lovastatin (MEVACOR) 40 MG  tablet Take 1 tablet (40 mg total) by mouth at bedtime. 01/05/14  Yes Sinclair Grooms, MD  Multiple Vitamin (MULTIVITAMIN WITH MINERALS) TABS tablet Take 1 tablet by mouth daily.   Yes Historical Provider, MD  pantoprazole (PROTONIX) 40 MG tablet Take 40 mg by mouth daily.   Yes Historical Provider, MD  Polyvinyl Alcohol-Povidone (REFRESH OP) Place 1 drop into both eyes 2 (two) times daily as needed (for dry eyes).   Yes Historical Provider, MD  warfarin (COUMADIN) 2 MG tablet Take 5 mg by mouth daily. At 1600   Yes Historical Provider, MD  HYDROcodone-homatropine [facility]) 5-1.5 MG/5ML syrup Take 5 mLs by mouth every 6 (six) hours as needed for cough. Patient not taking: Reported on 12/03/2014 04/04/14   Leandrew Koyanagi, MD   Physical Exam: Filed Vitals:   12/03/14 1611 12/03/14 1902  BP: 197/86 165/73  Pulse: 79 86  Temp: 98.4 F (36.9 C) 98.5 F (36.9 C)  TempSrc: Oral Oral  Resp: 18 18  SpO2: 96% 97%    Wt Readings from Last 3 Encounters:  11/22/14 69.31 kg (152 lb 12.8 oz)  08/17/14 70.761 kg (156 lb)  07/06/14 69.128 kg (152 lb 6.4 oz)    General: Appears calm and comfortable Eyes:  PERRL, normal lids, irises & conjunctiva ENT: Dry mucus membranes Neck:  no LAD, masses or thyromegaly Cardiovascular:  RRR, no m/r/g. No LE edema. Respiratory:  CTA bilaterally, no w/r/r. Normal respiratory effort. Abdomen: NABS, soft, ntnd Skin: no rash or induration seen on limited exam Musculoskeletal:  grossly normal tone BUE/BLE Psychiatric:  grossly normal mood and affect, speech fluent and appropriate Neurologic:  grossly non-focal.          Labs on Admission:  Basic Metabolic Panel:  Recent Labs Lab 12/03/14 1642  NA 140  K 4.2  CL 102  CO2 26  GLUCOSE 44*  BUN 25*  CREATININE 1.67*  CALCIUM 9.7   Liver Function Tests:  Recent Labs Lab 12/03/14 1642  AST 30  ALT 22  ALKPHOS 76  BILITOT 0.2*  PROT 6.5  ALBUMIN 4.0   No results for input(s): LIPASE, AMYLASE  in the last 168 hours. No results for input(s): AMMONIA in the last 168 hours. CBC:  Recent Labs Lab 12/03/14 1642  WBC 4.3  NEUTROABS 2.8  HGB 12.0  HCT 35.8*  MCV 83.4  PLT 185   Cardiac Enzymes: No results for input(s): CKTOTAL, CKMB, CKMBINDEX, TROPONINI in the last 168 hours.  BNP (last 3 results)  Recent Labs  06/23/14 1715 08/17/14 0838  PROBNP 2207.0* 101.0*   CBG:  Recent Labs Lab 12/03/14 1719 12/03/14 1811 12/03/14 1932  GLUCAP 42* 59* 73    Radiological Exams on Admission: No results found.  EKG:  Independently reviewed. Not done in ED  Assessment/Plan Principal Problem:   Hypoglycemia Active Problems:   Atrial fibrillation   Essential hypertension   Chronic diastolic heart failure   Diabetes mellitus type 2, controlled   CKD (chronic kidney disease) stage 3, GFR 30-59 ml/min   Hiatal hernia   GERD (gastroesophageal reflux disease)  Hypoglycemia: likely secondary to low oral intake and malabsorption from gut infection and glipizide use w/ possible increased cellular insulin sensitivity due to pt possibly no longer being diabetic.  - Admit for obs - Hold glipizide and levemir - A1c - D5 1/2NS 9ml/hr  Nausea/vomting/diarrhea: likely multifactorial from viral gastroenteritis vs bacterial gut infection from Cdiff vs prolonged hypoglycemia. No significant electrolyte imbalance. WBC nml. AFVSS.  2L NS in ED - Cdiff - stool cx - enteric precautions - Zofran - D5 1/2NS 22ml/hr (consider stopping D5 in fluids after glucose stabilizes ~ 100-150 for multiple reads) - CBG Q2  Diabetes: per pt report, last A1c 5.6 (not even in Pre-DM range). Pt continues to take glipizide daily adn levemir as needed (once every couple of weeks).  - A1c - hold DM meds as above - Discuss DC of glipizide and levemir completely w/ PCP  CKD III: near stage IV. Admission Cr 1.67, near baseline. 2L NS in ED - BMET in am  Chronic diastolic CHF: No sign of fluid  overload at this time. Last Echo 05/2013 w/ EF 55-60%. On Lasix for HTN and fluid retention. Has had a previous admission for CHF exacerbation.  - Monitor - continue lasix  Afib: RRR on palpation. No EKG or tele in ED. Only occasinoally in Afib per family - continue Coumadin - Continue bblocker and Dilt - EKG  HTN: very labile in ED and at home per family. No medications today due to GI upset - continue diltiazem and labetolol for Afib - Hold home clonidine and hydralazine  HIatal hernia/GERD:  - continue home protonix  HLD:  - continue home statin  Code Status: FULL DVT Prophylaxis: Full dose anticoaguolation for Afib Family Communication: 2 daughters Disposition Plan: pending improvement - likely obs  Time spent: >70 min in direct pt care and coordination  [place] [email or web address] Password [email or web address]

## 2014-12-03 NOTE — ED Notes (Signed)
Attempted to call report to 5 E. RN unable to take report at this time.

## 2014-12-03 NOTE — ED Provider Notes (Signed)
CSN: [id number]     Arrival date & time 12/03/14  1602 History   First MD Initiated Contact with Patient 12/03/14 1618     Chief Complaint  Patient presents with  . Nausea  . Emesis  . Diarrhea     (Consider location/radiation/quality/duration/timing/severity/associated sxs/prior Treatment) Patient is a 80 y.o. female presenting with vomiting and diarrhea. The history is provided by the patient.  Emesis Severity:  Severe Duration:  12 hours Timing:  Constant Number of daily episodes:  Episode of diarrhea and vomiting every 1 hour Quality:  Stomach contents Progression:  Unchanged Chronicity:  New Recent urination:  Normal Relieved by:  Nothing Worsened by:  Food smell Ineffective treatments:  None tried Associated symptoms: diarrhea   Associated symptoms: no abdominal pain, no chills, no cough, no fever, no sore throat and no URI   Associated symptoms comment:  Hypoglycemia 3 times in the last 12 hours 34, 40 and 50 that improved after IV sugar given by EMS.  States not been eating since sick but the last few days she has been having low sugar b/c she has been busy and not eating the way she should.  NO recent med changes.  But finished antibiotic last week for URI. Risk factors: diabetes   Risk factors: no travel to endemic areas   Risk factors comment:  Recent abx Diarrhea Associated symptoms: vomiting   Associated symptoms: no abdominal pain, no chills, no recent cough and no URI     Past Medical History  Diagnosis Date  . Hypertension   . Allergy   . GERD (gastroesophageal reflux disease)   . Hyperlipidemia   . Osteoporosis   . Chronic bronchitis     "lately" (06/23/2014)  . Type II diabetes mellitus   . H/O hiatal hernia   . Arthritis     "fingers and back" (06/23/2014)  . Chronic kidney disease (CKD), stage III (moderate)   . Basal cell carcinoma    Past Surgical History  Procedure Laterality Date  . Abdominal hysterectomy    . Appendectomy    . Basal cell  carcinoma excision      "face and nose"   Family History  Problem Relation Age of Onset  . Tuberculosis Mother   . Heart disease Father   . Heart disease Brother    History  Substance Use Topics  . Smoking status: Never Smoker   . Smokeless tobacco: Never Used  . Alcohol Use: No   OB History    No data available     Review of Systems  Constitutional: Negative for chills.  HENT: Negative for sore throat.   Gastrointestinal: Positive for vomiting and diarrhea. Negative for abdominal pain.  All other systems reviewed and are negative.     Allergies  Levaquin and Sulfa antibiotics  Home Medications   Prior to Admission medications   Medication Sig Start Date End Date Taking? Authorizing Provider  Acetaminophen (TYLENOL PO) Take 2 tablets by mouth daily as needed (for headache).    Historical Provider, MD  cloNIDine (CATAPRES) 0.1 MG tablet Take 0.2 mg by mouth daily. 03/26/14   Belva Crome III, MD  desloratadine (CLARINEX) 5 MG tablet Take 5 mg by mouth daily.    Historical Provider, MD  diltiazem (CARDIZEM) 60 MG tablet Take 60 mg by mouth 2 (two) times daily.    Historical Provider, MD  ferrous sulfate 325 (65 FE) MG tablet Take 325 mg by mouth 2 (two) times daily.  Historical Provider, MD  fluticasone (FLONASE) 50 MCG/ACT nasal spray Place 1 spray into both nostrils daily.     Historical Provider, MD  furosemide (LASIX) 20 MG tablet Take 2 tablets (40 mg total) by mouth daily. 06/24/14   Bobby Rumpf [place], PA-C  glipiZIDE (GLUCOTROL) 10 MG tablet Take 10 mg by mouth 2 (two) times daily.     Historical Provider, MD  hydrALAZINE (APRESOLINE) 50 MG tablet Take 100 mg by mouth 3 (three) times daily. 2 TABLETS TWICE DAILY    Historical Provider, MD  HYDROcodone-homatropine (HYCODAN) 5-1.5 MG/5ML syrup Take 5 mLs by mouth every 6 (six) hours as needed for cough. 04/04/14   Leandrew Koyanagi, MD  Insulin Detemir (LEVEMIR FLEXPEN) 100 UNIT/ML Pen Inject 10 Units into the skin  daily at 10 pm.    Historical Provider, MD  labetalol (NORMODYNE) 200 MG tablet Take 300 mg by mouth 2 (two) times daily. 03/26/14   Belva Crome III, MD  lovastatin (MEVACOR) 40 MG tablet Take 1 tablet (40 mg total) by mouth at bedtime. 01/05/14   Belva Crome III, MD  Multiple Vitamin (MULTIVITAMIN WITH MINERALS) TABS tablet Take 1 tablet by mouth daily.    Historical Provider, MD  pantoprazole (PROTONIX) 40 MG tablet Take 40 mg by mouth daily.    Historical Provider, MD  Polyvinyl Alcohol-Povidone (REFRESH OP) Place 1 drop into both eyes 2 (two) times daily as needed (for dry eyes).    Historical Provider, MD  warfarin (COUMADIN) 2 MG tablet Take 5 mg by mouth daily.     Historical Provider, MD   BP 197/86 mmHg  Pulse 79  Temp(Src) 98.4 F (36.9 C) (Oral)  Resp 18  SpO2 96% Physical Exam  Constitutional: She is oriented to person, place, and time. She appears well-developed and well-nourished. No distress.  HENT:  Head: Normocephalic and atraumatic.  Mouth/Throat: Mucous membranes are dry.  Eyes: EOM are normal. Pupils are equal, round, and reactive to light.  Cardiovascular: Normal rate, regular rhythm, normal heart sounds and intact distal pulses.  Exam reveals no friction rub.   No murmur heard. Pulmonary/Chest: Effort normal and breath sounds normal. She has no wheezes. She has no rales.  Abdominal: Soft. Bowel sounds are normal. She exhibits no distension. There is no tenderness. There is no rebound and no guarding.  Musculoskeletal: Normal range of motion. She exhibits no tenderness.  No edema  Neurological: She is alert and oriented to person, place, and time. No cranial nerve deficit.  Skin: Skin is warm and dry. No rash noted.  Psychiatric: She has a normal mood and affect. Her behavior is normal.  Nursing note and vitals reviewed.   ED Course  Procedures (including critical care time) Labs Review Labs Reviewed  CBC WITH DIFFERENTIAL - Abnormal; Notable for the  following:    HCT 35.8 (*)    All other components within normal limits  COMPREHENSIVE METABOLIC PANEL - Abnormal; Notable for the following:    Glucose, Bld 44 (*)    BUN 25 (*)    Creatinine, Ser 1.67 (*)    Total Bilirubin 0.2 (*)    GFR calc non Af Amer 27 (*)    GFR calc Af Amer 32 (*)    All other components within normal limits  CBG MONITORING, ED - Abnormal; Notable for the following:    Glucose-Capillary 42 (*)    All other components within normal limits  URINALYSIS, ROUTINE W REFLEX MICROSCOPIC    Imaging Review  No results found.   EKG Interpretation None      MDM   Final diagnoses:  Hypoglycemia  Diarrhea    Pt with N/V/D for the last 24 hrs also with 3 episodes of hypoglycemia to 34-40.  Pt given D5W and her pt BS 108.  Her HEENT denies any fever, respiratory symptoms or urinary complaints. She states she finished an antibiotic approximately a week ago for upper respiratory infection but is not clear what the medication was. His any blood in her stool or recent travel. She states her last 3 days her blood sugar has been running low but she has been very busy and is up and eating like she should.  On exam patient has no abdominal pain and is able to get up and ambulate without difficulty. She denies any syncopal or presyncopal symptoms.  CBC, CMP, UA pending. Patient given IV fluids and Zofran. Given patient's persistent hypoglycemia concern for possible new acute renal failure versus poor by mouth intake. Will monitor blood sugar closely. Nausea vomiting and diarrhea seems to be most likely viral in origin however also possibility of C. difficile given patient's recent antibiotics.  5:58 PM Pt with persistent hypoglycemia and started on D5 drip.  Labs without evidence of ARF or other abnormalities.  Will admit for hypoglycemia.  Blanchie Dessert, MD 12/03/14 [phone]

## 2014-12-03 NOTE — ED Notes (Signed)
Witnessed [employer], (NT) do in and out cath

## 2014-12-03 NOTE — ED Notes (Signed)
Bed: WA03 Expected date:  Expected time:  Means of arrival:  Comments: EMS-N/V/D 

## 2014-12-03 NOTE — ED Notes (Signed)
Per EMS, pt has had n/v/d since 2am today. Pt is taking glipizide; pt states she noticed her blood sugar has been in 40s for the past 3 days. EMS states blood sugar was 60 this morning. Pt was given 171ml of D5NS en route to hospital, blood sugar now 108.

## 2014-12-03 NOTE — ED Notes (Signed)
Patient had episode of diarrhea upon arrival, will get urine sample at a later time

## 2014-12-03 NOTE — ED Notes (Signed)
MD at bedside. Hospitalist at bedside. 

## 2014-12-04 ENCOUNTER — Encounter (HOSPITAL_COMMUNITY): Payer: Self-pay

## 2014-12-04 DIAGNOSIS — I5032 Chronic diastolic (congestive) heart failure: Secondary | ICD-10-CM | POA: Diagnosis not present

## 2014-12-04 DIAGNOSIS — R791 Abnormal coagulation profile: Secondary | ICD-10-CM | POA: Diagnosis present

## 2014-12-04 DIAGNOSIS — E11649 Type 2 diabetes mellitus with hypoglycemia without coma: Secondary | ICD-10-CM | POA: Diagnosis present

## 2014-12-04 DIAGNOSIS — I482 Chronic atrial fibrillation: Secondary | ICD-10-CM

## 2014-12-04 DIAGNOSIS — Z881 Allergy status to other antibiotic agents status: Secondary | ICD-10-CM | POA: Diagnosis not present

## 2014-12-04 DIAGNOSIS — I129 Hypertensive chronic kidney disease with stage 1 through stage 4 chronic kidney disease, or unspecified chronic kidney disease: Secondary | ICD-10-CM | POA: Diagnosis present

## 2014-12-04 DIAGNOSIS — E162 Hypoglycemia, unspecified: Secondary | ICD-10-CM | POA: Diagnosis not present

## 2014-12-04 DIAGNOSIS — R112 Nausea with vomiting, unspecified: Secondary | ICD-10-CM | POA: Diagnosis present

## 2014-12-04 DIAGNOSIS — Z85828 Personal history of other malignant neoplasm of skin: Secondary | ICD-10-CM | POA: Diagnosis not present

## 2014-12-04 DIAGNOSIS — R197 Diarrhea, unspecified: Secondary | ICD-10-CM

## 2014-12-04 DIAGNOSIS — I10 Essential (primary) hypertension: Secondary | ICD-10-CM | POA: Diagnosis not present

## 2014-12-04 DIAGNOSIS — I480 Paroxysmal atrial fibrillation: Secondary | ICD-10-CM | POA: Diagnosis present

## 2014-12-04 DIAGNOSIS — Z882 Allergy status to sulfonamides status: Secondary | ICD-10-CM | POA: Diagnosis not present

## 2014-12-04 DIAGNOSIS — K449 Diaphragmatic hernia without obstruction or gangrene: Secondary | ICD-10-CM | POA: Diagnosis present

## 2014-12-04 DIAGNOSIS — K219 Gastro-esophageal reflux disease without esophagitis: Secondary | ICD-10-CM | POA: Diagnosis not present

## 2014-12-04 DIAGNOSIS — Z7901 Long term (current) use of anticoagulants: Secondary | ICD-10-CM | POA: Diagnosis not present

## 2014-12-04 DIAGNOSIS — Z794 Long term (current) use of insulin: Secondary | ICD-10-CM | POA: Diagnosis not present

## 2014-12-04 DIAGNOSIS — M810 Age-related osteoporosis without current pathological fracture: Secondary | ICD-10-CM | POA: Diagnosis present

## 2014-12-04 DIAGNOSIS — N183 Chronic kidney disease, stage 3 (moderate): Secondary | ICD-10-CM | POA: Diagnosis not present

## 2014-12-04 DIAGNOSIS — E785 Hyperlipidemia, unspecified: Secondary | ICD-10-CM | POA: Diagnosis present

## 2014-12-04 DIAGNOSIS — E119 Type 2 diabetes mellitus without complications: Secondary | ICD-10-CM | POA: Diagnosis not present

## 2014-12-04 LAB — CBC
HCT: 32.8 % — ABNORMAL LOW (ref 36.0–46.0)
Hemoglobin: 11 g/dL — ABNORMAL LOW (ref 12.0–15.0)
MCH: 28.1 pg (ref 26.0–34.0)
MCHC: 33.5 g/dL (ref 30.0–36.0)
MCV: 83.9 fL (ref 78.0–100.0)
PLATELETS: 182 10*3/uL (ref 150–400)
RBC: 3.91 MIL/uL (ref 3.87–5.11)
RDW: 14.4 % (ref 11.5–15.5)
WBC: 3.4 10*3/uL — AB (ref 4.0–10.5)

## 2014-12-04 LAB — GLUCOSE, CAPILLARY
GLUCOSE-CAPILLARY: 105 mg/dL — AB (ref 70–99)
GLUCOSE-CAPILLARY: 107 mg/dL — AB (ref 70–99)
Glucose-Capillary: 111 mg/dL — ABNORMAL HIGH (ref 70–99)
Glucose-Capillary: 131 mg/dL — ABNORMAL HIGH (ref 70–99)
Glucose-Capillary: 138 mg/dL — ABNORMAL HIGH (ref 70–99)
Glucose-Capillary: 193 mg/dL — ABNORMAL HIGH (ref 70–99)
Glucose-Capillary: 187 mg/dL — ABNORMAL HIGH (ref 70–99)
Glucose-Capillary: 212 mg/dL — ABNORMAL HIGH (ref 70–99)

## 2014-12-04 LAB — COMPREHENSIVE METABOLIC PANEL
ALBUMIN: 3.4 g/dL — AB (ref 3.5–5.2)
ALT: 17 U/L (ref 0–35)
AST: 25 U/L (ref 0–37)
Alkaline Phosphatase: 63 U/L (ref 39–117)
Anion gap: 11 (ref 5–15)
BUN: 19 mg/dL (ref 6–23)
CALCIUM: 8.9 mg/dL (ref 8.4–10.5)
CO2: 24 meq/L (ref 19–32)
CREATININE: 1.63 mg/dL — AB (ref 0.50–1.10)
Chloride: 105 mEq/L (ref 96–112)
GFR calc Af Amer: 33 mL/min — ABNORMAL LOW (ref >90)
GFR, EST NON AFRICAN AMERICAN: 28 mL/min — AB (ref >90)
Glucose, Bld: 130 mg/dL — ABNORMAL HIGH (ref 70–99)
Potassium: 4 mEq/L (ref 3.7–5.3)
Sodium: 140 mEq/L (ref 137–147)
Total Bilirubin: 0.3 mg/dL (ref 0.3–1.2)
Total Protein: 5.6 g/dL — ABNORMAL LOW (ref 6.0–8.3)

## 2014-12-04 LAB — PROTIME-INR
INR: 3.6 — ABNORMAL HIGH (ref 0.00–1.49)
Prothrombin Time: 36.2 seconds — ABNORMAL HIGH (ref 11.6–15.2)

## 2014-12-04 LAB — HEMOGLOBIN A1C
Hgb A1c MFr Bld: 5.8 % — ABNORMAL HIGH (ref <5.7)
MEAN PLASMA GLUCOSE: 120 mg/dL — AB (ref <117)

## 2014-12-04 MED ORDER — WARFARIN SODIUM 2 MG PO TABS: 5.0000 mg | ORAL_TABLET | Freq: Every day | ORAL | Status: DC

## 2014-12-04 MED ORDER — GLIMEPIRIDE 1 MG PO TABS: 1.0000 mg | ORAL_TABLET | Freq: Every day | ORAL | Status: AC

## 2014-12-04 NOTE — Plan of Care (Signed)
Problem: Phase I Progression Outcomes Goal: Pain controlled with appropriate interventions Outcome: Progressing     

## 2014-12-04 NOTE — Progress Notes (Signed)
ANTICOAGULATION CONSULT NOTE - Follow-up  Pharmacy Consult for Warfarin Indication: atrial fibrillation  Allergies  Allergen Reactions  . Levaquin [Levofloxacin]   . Sulfa Antibiotics     Patient Measurements:    Vital Signs: Temp: 98.5 F (36.9 C) (12/05 0518) Temp Source: Oral (12/05 0518) BP: 160/63 mmHg (12/05 0518) Pulse Rate: 68 (12/05 0518)  Labs:  Recent Labs  12/03/14 1642 12/03/14 2029 12/04/14 0535  HGB 12.0  --  11.0*  HCT 35.8*  --  32.8*  PLT 185  --  182  LABPROT  --  35.6* 36.2*  INR  --  3.53* 3.60*  CREATININE 1.67*  --  1.63*    Estimated Creatinine Clearance: 25.8 mL/min (by C-G formula based on Cr of 1.63).   Medical History: Past Medical History  Diagnosis Date  . Hypertension   . Allergy   . GERD (gastroesophageal reflux disease)   . Hyperlipidemia   . Osteoporosis   . Chronic bronchitis     "lately" (06/23/2014)  . Type II diabetes mellitus   . H/O hiatal hernia   . Arthritis     "fingers and back" (06/23/2014)  . Chronic kidney disease (CKD), stage III (moderate)   . Basal cell carcinoma   . Paroxysmal a-fib     Medications:  Scheduled:  . diltiazem  60 mg Oral BID  . furosemide  40 mg Oral Daily  . labetalol  300 mg Oral BID  . loratadine  10 mg Oral Daily  . ondansetron  4 mg Intravenous Once  . pantoprazole  40 mg Oral Daily  . pravastatin  40 mg Oral q1800   Infusions:  . dextrose 5 % and 0.45% NaCl 75 mL/hr at 12/03/14 2217   PRN: acetaminophen **OR** acetaminophen, HYDROcodone-acetaminophen, ondansetron **OR** ondansetron (ZOFRAN) IV  Assessment: 80 yo female admitted 12/4 with hypoglycemia. On chronic warfarin for afib. INR on 11/22/14 slightly subtherapeutic (1.9) attributed to a missed dose on 11/16/14 - instructed to take extra dose that day and continue same regimen which is 5mg  daily.  INR today = 3.6  Goal of Therapy:  INR 2-3   Plan:   Hold warfarin tonight  Daily PT/INR  Dolly Rias  RPh 12/04/2014, 10:51 AM Pager [phone]

## 2014-12-04 NOTE — Progress Notes (Signed)
CARE MANAGEMENT NOTE 12/04/2014  Patient:  Ariel Avila, Ariel Avila   Account Number:  [id number]  Date Initiated:  12/04/2014  Documentation initiated by:  HARRIS,CRYSTAL  Subjective/Objective Assessment:   Hypoglycemia     Action/Plan:   Anticipated DC Date:  12/04/2014   Anticipated DC Plan:  [place]  CM consult      Choice offered to / List presented to:             Status of service:  Completed, signed off Medicare Important Message given?  NA - LOS <3 / Initial given by admissions (If response is "NO", the following Medicare IM given date fields will be blank) Date Medicare IM given:   Medicare IM given by:   Date Additional Medicare IM given:   Additional Medicare IM given by:    Discharge Disposition:  HOME/SELF CARE  Per UR Regulation:  Reviewed for med. necessity/level of care/duration of stay  If discussed at [place] of Stay Meetings, dates discussed:    Comments:  12/04/2014 - Chart reviewed. No needs identified. Venita Sheffield RN CCM

## 2014-12-04 NOTE — Plan of Care (Signed)
Problem: Phase I Progression Outcomes Goal: Hemodynamically stable Outcome: Progressing     

## 2014-12-04 NOTE — Discharge Summary (Signed)
Physician Discharge Summary  Ariel Avila:[id number] DOB: 1930/01/17 DOA: 12/03/2014  PCP: Gennette Pac, MD  Admit date: 12/03/2014 Discharge date: 12/04/2014  Time spent: >30 minutes  Recommendations for Outpatient Follow-up:  Follow CBG's log and adjust hypoglycemic regimen as needed Repeat BMET to follow electrolytes and renal function  Discharge Diagnoses:  Principal Problem:   Hypoglycemia Active Problems:   Atrial fibrillation   Essential hypertension   Chronic diastolic heart failure   Diabetes mellitus type 2, controlled   CKD (chronic kidney disease) stage 3, GFR 30-59 ml/min   Hiatal hernia   GERD (gastroesophageal reflux disease)   Nausea vomiting and diarrhea   Discharge Condition: stable and improved. No further episodes of N/V, diarrhea, fever or hypoglycemia at discharge. Patient will follow up with PCP in 3-5 days  Diet recommendation: low carbohydrates and heart healthy diet   There were no vitals filed for this visit.  History of present illness:  80 y.o. female who presented to ED with hypoglycemia. Episode started apporx 24 hrs prior to admisison. Patient Started feeling week and talking odd. Took glipizide despite low glucose readings that day. EMS was called and CBG in the 50's range at that time. Patient received 1/2 amp D50 with improvement in her symptoms; but subsequently continue having low CBG readings. Patient endorses N/V, Chills and diarrhea along with hypoglycemia. Patient brought to Ed for further evaluation and treatment.  Hospital Course:  1-hypoglycemia: secondary to decrease oral intake and continue use of glipizide in a patient with A1C 5.8 -insulin discontinued -Glipizide discontinued  -patient advise to follow low carb diet and starting on Monday 12/06/14, low dose amaryl once a day -CBG's monitoring BID and follow up with PCP for further medication adjustments -at discharge CBG's where in 180-200 range (post-prandial and with  ongoing D5)  2-N/V and diarrhea: no further episodes since admission -after discussing with daughter and patient everything happened with low sugar episode -no fever, no chills and no further episodes since sugar level was corrected -doubt infections; family members and patient will observe/monitor for any recurrence of these symptoms  3-CKD stage 3: stable and at baseline -at discharge Cr 1.63  4-DM type 2: with A1C 5.8 -as mentioned above will focus mainly on diet control with low dose amaryl -close follow up with PCP for further adjustments   5-GERD: continue PPI  6-supratherapeutic INR: will continue coumadin but given mild elevation will hold dose today (12/5) and tomorrow (12/6); patient will follow with coumadin clinic on Monday (12/7)  7-HLD: continue statins  8-atrial fibrillation: chronic. And well controlled -will continue B-blocker, diltiazem and coumadin   9-essential HTN: advise to follow low sodium diet -resume home antihypertensive regimen     Procedures:  See below for x-ray reports   Consultations:  None   Discharge Exam: Filed Vitals:   12/04/14 0518  BP: 160/63  Pulse: 68  Temp: 98.5 F (36.9 C)  Resp: 16    General: AAOX3, no neurologic deficit; afebrile, no CP or SOB. No nausea, vomiting or diarrhea since admission. Cardiovascular: S1 and S2, no rubs or gallops Respiratory: CTA bilaterally Abd: soft, NT, ND, positive BS Extremities: no edema or cyanosis   Discharge Instructions You were cared for by a hospitalist during your hospital stay. If you have any questions about your discharge medications or the care you received while you were in the hospital after you are discharged, you can call the unit and asked to speak with the hospitalist on call if the  hospitalist that took care of you is not available. Once you are discharged, your primary care physician will handle any further medical issues. Please note that NO REFILLS for any discharge  medications will be authorized once you are discharged, as it is imperative that you return to your primary care physician (or establish a relationship with a primary care physician if you do not have one) for your aftercare needs so that they can reassess your need for medications and monitor your lab values.  Discharge Instructions    Diet - low sodium heart healthy    Complete by:  As directed      Discharge instructions    Complete by:  As directed   Take medications as prescribed Follow a low sodium and low carbohydrates diet Check your blood sugar twice a day (fasting in am and before bedtime); bring log to follow up appointment with PCP Arrange follow up with PCP in 3-5 days Hold coumadin until Monday and make sure to visit coumadin clinic on Monday Maintain yourself well hydrated          Current Discharge Medication List    START taking these medications   Details  glimepiride (AMARYL) 1 MG tablet Take 1 tablet (1 mg total) by mouth daily with breakfast. Qty: 30 tablet, Refills: 1      CONTINUE these medications which have CHANGED   Details  warfarin (COUMADIN) 2 MG tablet Take 2.5 tablets (5 mg total) by mouth daily. At 1600      CONTINUE these medications which have NOT CHANGED   Details  Acetaminophen (TYLENOL PO) Take 2 tablets by mouth daily as needed (for headache).    cloNIDine (CATAPRES) 0.2 MG tablet Take 0.2 mg by mouth daily.    desloratadine (CLARINEX) 5 MG tablet Take 5 mg by mouth daily.    diltiazem (CARDIZEM) 60 MG tablet Take 60 mg by mouth 2 (two) times daily.    ferrous sulfate 325 (65 FE) MG tablet Take 325 mg by mouth 2 (two) times daily.     fluticasone (FLONASE) 50 MCG/ACT nasal spray Place 1 spray into both nostrils daily.     furosemide (LASIX) 20 MG tablet Take 2 tablets (40 mg total) by mouth daily. Qty: 60 tablet, Refills: 3    hydrALAZINE (APRESOLINE) 50 MG tablet Take 100 mg by mouth 3 (three) times daily. 2 TABLETS TWICE DAILY     labetalol (NORMODYNE) 200 MG tablet Take 300 mg by mouth 2 (two) times daily.    lovastatin (MEVACOR) 40 MG tablet Take 1 tablet (40 mg total) by mouth at bedtime. Qty: 30 tablet, Refills: 10    Multiple Vitamin (MULTIVITAMIN WITH MINERALS) TABS tablet Take 1 tablet by mouth daily.    pantoprazole (PROTONIX) 40 MG tablet Take 40 mg by mouth daily.    Polyvinyl Alcohol-Povidone (REFRESH OP) Place 1 drop into both eyes 2 (two) times daily as needed (for dry eyes).    HYDROcodone-homatropine (HYCODAN) 5-1.5 MG/5ML syrup Take 5 mLs by mouth every 6 (six) hours as needed for cough. Qty: 120 mL, Refills: 0      STOP taking these medications     glipiZIDE (GLUCOTROL) 10 MG tablet      Insulin Detemir (LEVEMIR FLEXPEN) 100 UNIT/ML Pen        Allergies  Allergen Reactions  . Levaquin [Levofloxacin]   . Sulfa Antibiotics    Follow-up Information    Follow up with Gennette Pac, MD.   Specialty:  Family Medicine  Why:  3-5 days; call office   Contact information:   [address] [place] [address] [phone]        The results of significant diagnostics from this hospitalization (including imaging, microbiology, ancillary and laboratory) are listed below for reference.    Significant Diagnostic Studies: No results found.  Labs: Basic Metabolic Panel:  Recent Labs Lab 12/03/14 1642 12/04/14 0535  NA 140 140  K 4.2 4.0  CL 102 105  CO2 26 24  GLUCOSE 44* 130*  BUN 25* 19  CREATININE 1.67* 1.63*  CALCIUM 9.7 8.9   Liver Function Tests:  Recent Labs Lab 12/03/14 1642 12/04/14 0535  AST 30 25  ALT 22 17  ALKPHOS 76 63  BILITOT 0.2* 0.3  PROT 6.5 5.6*  ALBUMIN 4.0 3.4*   CBC:  Recent Labs Lab 12/03/14 1642 12/04/14 0535  WBC 4.3 3.4*  NEUTROABS 2.8  --   HGB 12.0 11.0*  HCT 35.8* 32.8*  MCV 83.4 83.9  PLT 185 182   BNP (last 3 results)  Recent Labs  06/23/14 1715 08/17/14 0838  PROBNP 2207.0* 101.0*   CBG:  Recent  Labs Lab 12/04/14 0721 12/04/14 0929 12/04/14 1117 12/04/14 1300 12/04/14 1415  GLUCAP 131* 138* 193* 187* 212*    Signed:  Barton Dubois  [facility] 12/04/2014, 3:10 PM

## 2014-12-04 NOTE — Progress Notes (Signed)
Discharge instructions given to pt, verbalized understanding. Left the unit in stable condition. 

## 2014-12-04 NOTE — Plan of Care (Signed)
Problem: Discharge Progression Outcomes Goal: Discharge plan in place and appropriate Outcome: Completed/Met Date Met:  12/04/14 Goal: Pain controlled with appropriate interventions Outcome: Completed/Met Date Met:  12/04/14 Goal: Hemodynamically stable Outcome: Completed/Met Date Met:  26/08/88 Goal: Complications resolved/controlled Outcome: Completed/Met Date Met:  12/04/14 Goal: Tolerating diet Outcome: Completed/Met Date Met:  12/04/14 Goal: Activity appropriate for discharge plan Outcome: Completed/Met Date Met:  12/04/14 Goal: Other Discharge Outcomes/Goals Outcome: Completed/Met Date Met:  12/04/14

## 2014-12-04 NOTE — Progress Notes (Signed)
Utilization Review completed.  

## 2014-12-08 DIAGNOSIS — E162 Hypoglycemia, unspecified: Secondary | ICD-10-CM | POA: Diagnosis not present

## 2014-12-08 DIAGNOSIS — Z7901 Long term (current) use of anticoagulants: Secondary | ICD-10-CM | POA: Diagnosis not present

## 2014-12-08 DIAGNOSIS — I482 Chronic atrial fibrillation: Secondary | ICD-10-CM | POA: Diagnosis not present

## 2014-12-08 DIAGNOSIS — E119 Type 2 diabetes mellitus without complications: Secondary | ICD-10-CM | POA: Diagnosis not present

## 2014-12-10 DIAGNOSIS — Z7901 Long term (current) use of anticoagulants: Secondary | ICD-10-CM | POA: Diagnosis not present

## 2014-12-10 DIAGNOSIS — I4891 Unspecified atrial fibrillation: Secondary | ICD-10-CM | POA: Diagnosis not present

## 2014-12-13 ENCOUNTER — Ambulatory Visit (INDEPENDENT_AMBULATORY_CARE_PROVIDER_SITE_OTHER): Payer: Medicare Other | Admitting: *Deleted

## 2014-12-13 DIAGNOSIS — Z5181 Encounter for therapeutic drug level monitoring: Secondary | ICD-10-CM

## 2014-12-13 DIAGNOSIS — I4891 Unspecified atrial fibrillation: Secondary | ICD-10-CM | POA: Diagnosis not present

## 2014-12-13 LAB — POCT INR: INR: 1.9

## 2014-12-27 ENCOUNTER — Ambulatory Visit (INDEPENDENT_AMBULATORY_CARE_PROVIDER_SITE_OTHER): Payer: Medicare Other

## 2014-12-27 DIAGNOSIS — I4891 Unspecified atrial fibrillation: Secondary | ICD-10-CM | POA: Diagnosis not present

## 2014-12-27 DIAGNOSIS — Z5181 Encounter for therapeutic drug level monitoring: Secondary | ICD-10-CM | POA: Diagnosis not present

## 2014-12-27 LAB — POCT INR: INR: 3.1

## 2015-01-04 DIAGNOSIS — J310 Chronic rhinitis: Secondary | ICD-10-CM | POA: Diagnosis not present

## 2015-01-04 DIAGNOSIS — E785 Hyperlipidemia, unspecified: Secondary | ICD-10-CM | POA: Diagnosis not present

## 2015-01-04 DIAGNOSIS — N189 Chronic kidney disease, unspecified: Secondary | ICD-10-CM | POA: Diagnosis not present

## 2015-01-04 DIAGNOSIS — Z7901 Long term (current) use of anticoagulants: Secondary | ICD-10-CM | POA: Diagnosis not present

## 2015-01-04 DIAGNOSIS — I10 Essential (primary) hypertension: Secondary | ICD-10-CM | POA: Diagnosis not present

## 2015-01-04 DIAGNOSIS — I4891 Unspecified atrial fibrillation: Secondary | ICD-10-CM | POA: Diagnosis not present

## 2015-01-04 DIAGNOSIS — E162 Hypoglycemia, unspecified: Secondary | ICD-10-CM | POA: Diagnosis not present

## 2015-01-04 DIAGNOSIS — E119 Type 2 diabetes mellitus without complications: Secondary | ICD-10-CM | POA: Diagnosis not present

## 2015-01-10 ENCOUNTER — Ambulatory Visit (INDEPENDENT_AMBULATORY_CARE_PROVIDER_SITE_OTHER): Payer: Medicare Other | Admitting: Pharmacist

## 2015-01-10 DIAGNOSIS — Z5181 Encounter for therapeutic drug level monitoring: Secondary | ICD-10-CM

## 2015-01-10 DIAGNOSIS — I4891 Unspecified atrial fibrillation: Secondary | ICD-10-CM

## 2015-01-10 LAB — POCT INR: INR: 3

## 2015-01-31 ENCOUNTER — Ambulatory Visit (INDEPENDENT_AMBULATORY_CARE_PROVIDER_SITE_OTHER): Payer: Medicare Other | Admitting: *Deleted

## 2015-01-31 DIAGNOSIS — Z5181 Encounter for therapeutic drug level monitoring: Secondary | ICD-10-CM

## 2015-01-31 DIAGNOSIS — I4891 Unspecified atrial fibrillation: Secondary | ICD-10-CM | POA: Diagnosis not present

## 2015-01-31 LAB — POCT INR: INR: 3.1

## 2015-02-17 ENCOUNTER — Ambulatory Visit (INDEPENDENT_AMBULATORY_CARE_PROVIDER_SITE_OTHER): Payer: Medicare Other | Admitting: *Deleted

## 2015-02-17 DIAGNOSIS — I4891 Unspecified atrial fibrillation: Secondary | ICD-10-CM

## 2015-02-17 DIAGNOSIS — Z5181 Encounter for therapeutic drug level monitoring: Secondary | ICD-10-CM

## 2015-02-17 LAB — POCT INR: INR: 2.2

## 2015-03-03 ENCOUNTER — Ambulatory Visit (INDEPENDENT_AMBULATORY_CARE_PROVIDER_SITE_OTHER): Payer: Medicare Other | Admitting: *Deleted

## 2015-03-03 DIAGNOSIS — I4891 Unspecified atrial fibrillation: Secondary | ICD-10-CM | POA: Diagnosis not present

## 2015-03-03 DIAGNOSIS — Z5181 Encounter for therapeutic drug level monitoring: Secondary | ICD-10-CM | POA: Diagnosis not present

## 2015-03-03 LAB — POCT INR: INR: 3.4

## 2015-03-15 ENCOUNTER — Ambulatory Visit (INDEPENDENT_AMBULATORY_CARE_PROVIDER_SITE_OTHER): Payer: Medicare Other | Admitting: *Deleted

## 2015-03-15 DIAGNOSIS — I4891 Unspecified atrial fibrillation: Secondary | ICD-10-CM | POA: Diagnosis not present

## 2015-03-15 DIAGNOSIS — Z5181 Encounter for therapeutic drug level monitoring: Secondary | ICD-10-CM | POA: Diagnosis not present

## 2015-03-15 LAB — POCT INR: INR: 3.1

## 2015-03-29 ENCOUNTER — Ambulatory Visit (INDEPENDENT_AMBULATORY_CARE_PROVIDER_SITE_OTHER): Payer: Medicare Other

## 2015-03-29 DIAGNOSIS — I4891 Unspecified atrial fibrillation: Secondary | ICD-10-CM

## 2015-03-29 DIAGNOSIS — Z5181 Encounter for therapeutic drug level monitoring: Secondary | ICD-10-CM

## 2015-03-29 LAB — POCT INR: INR: 2.4

## 2015-04-05 DIAGNOSIS — I482 Chronic atrial fibrillation: Secondary | ICD-10-CM | POA: Diagnosis not present

## 2015-04-05 DIAGNOSIS — E119 Type 2 diabetes mellitus without complications: Secondary | ICD-10-CM | POA: Diagnosis not present

## 2015-04-05 DIAGNOSIS — E782 Mixed hyperlipidemia: Secondary | ICD-10-CM | POA: Diagnosis not present

## 2015-04-05 DIAGNOSIS — R05 Cough: Secondary | ICD-10-CM | POA: Diagnosis not present

## 2015-04-05 DIAGNOSIS — Z7901 Long term (current) use of anticoagulants: Secondary | ICD-10-CM | POA: Diagnosis not present

## 2015-04-05 DIAGNOSIS — I5032 Chronic diastolic (congestive) heart failure: Secondary | ICD-10-CM | POA: Diagnosis not present

## 2015-04-05 DIAGNOSIS — I10 Essential (primary) hypertension: Secondary | ICD-10-CM | POA: Diagnosis not present

## 2015-04-19 ENCOUNTER — Ambulatory Visit (INDEPENDENT_AMBULATORY_CARE_PROVIDER_SITE_OTHER): Payer: Medicare Other

## 2015-04-19 DIAGNOSIS — Z5181 Encounter for therapeutic drug level monitoring: Secondary | ICD-10-CM

## 2015-04-19 DIAGNOSIS — I4891 Unspecified atrial fibrillation: Secondary | ICD-10-CM | POA: Diagnosis not present

## 2015-04-19 LAB — POCT INR: INR: 2.3

## 2015-05-04 ENCOUNTER — Telehealth: Payer: Self-pay

## 2015-05-04 MED ORDER — WARFARIN SODIUM 2 MG PO TABS: ORAL_TABLET | ORAL | Status: DC

## 2015-05-04 NOTE — Telephone Encounter (Signed)
Warfarin refill sent to pharmacy as requested. 

## 2015-05-05 MED ORDER — WARFARIN SODIUM 2 MG PO TABS: ORAL_TABLET | ORAL | Status: DC

## 2015-05-05 NOTE — Telephone Encounter (Signed)
Pt called states she needs her Warfarin rx sent to [facility], do to insurance changes.  Rx sent to pharmacy as requested.

## 2015-05-05 NOTE — Addendum Note (Signed)
Addended by: Theophilus Kinds on: 05/05/2015 02:44 PM   Modules accepted: Orders

## 2015-05-10 DIAGNOSIS — N183 Chronic kidney disease, stage 3 (moderate): Secondary | ICD-10-CM | POA: Diagnosis not present

## 2015-05-10 DIAGNOSIS — N2581 Secondary hyperparathyroidism of renal origin: Secondary | ICD-10-CM | POA: Diagnosis not present

## 2015-05-10 DIAGNOSIS — N189 Chronic kidney disease, unspecified: Secondary | ICD-10-CM | POA: Diagnosis not present

## 2015-05-10 DIAGNOSIS — D631 Anemia in chronic kidney disease: Secondary | ICD-10-CM | POA: Diagnosis not present

## 2015-05-17 ENCOUNTER — Ambulatory Visit (INDEPENDENT_AMBULATORY_CARE_PROVIDER_SITE_OTHER): Payer: Medicare Other | Admitting: *Deleted

## 2015-05-17 DIAGNOSIS — I4891 Unspecified atrial fibrillation: Secondary | ICD-10-CM

## 2015-05-17 DIAGNOSIS — Z5181 Encounter for therapeutic drug level monitoring: Secondary | ICD-10-CM

## 2015-05-17 LAB — POCT INR: INR: 2.3

## 2015-06-07 ENCOUNTER — Ambulatory Visit (INDEPENDENT_AMBULATORY_CARE_PROVIDER_SITE_OTHER): Payer: Medicare Other | Admitting: *Deleted

## 2015-06-07 DIAGNOSIS — Z5181 Encounter for therapeutic drug level monitoring: Secondary | ICD-10-CM

## 2015-06-07 DIAGNOSIS — I4891 Unspecified atrial fibrillation: Secondary | ICD-10-CM | POA: Diagnosis not present

## 2015-06-07 LAB — POCT INR: INR: 2.6

## 2015-07-05 ENCOUNTER — Ambulatory Visit (INDEPENDENT_AMBULATORY_CARE_PROVIDER_SITE_OTHER): Payer: Medicare Other | Admitting: *Deleted

## 2015-07-05 DIAGNOSIS — Z5181 Encounter for therapeutic drug level monitoring: Secondary | ICD-10-CM

## 2015-07-05 DIAGNOSIS — I4891 Unspecified atrial fibrillation: Secondary | ICD-10-CM

## 2015-07-05 LAB — POCT INR: INR: 2.4

## 2015-08-02 ENCOUNTER — Ambulatory Visit (INDEPENDENT_AMBULATORY_CARE_PROVIDER_SITE_OTHER): Payer: Medicare Other | Admitting: *Deleted

## 2015-08-02 DIAGNOSIS — I4891 Unspecified atrial fibrillation: Secondary | ICD-10-CM | POA: Diagnosis not present

## 2015-08-02 DIAGNOSIS — Z5181 Encounter for therapeutic drug level monitoring: Secondary | ICD-10-CM

## 2015-08-02 LAB — POCT INR: INR: 2.5

## 2015-08-05 DIAGNOSIS — E119 Type 2 diabetes mellitus without complications: Secondary | ICD-10-CM | POA: Diagnosis not present

## 2015-08-05 DIAGNOSIS — I10 Essential (primary) hypertension: Secondary | ICD-10-CM | POA: Diagnosis not present

## 2015-08-05 DIAGNOSIS — I5032 Chronic diastolic (congestive) heart failure: Secondary | ICD-10-CM | POA: Diagnosis not present

## 2015-08-05 DIAGNOSIS — Z794 Long term (current) use of insulin: Secondary | ICD-10-CM | POA: Diagnosis not present

## 2015-08-05 DIAGNOSIS — I482 Chronic atrial fibrillation: Secondary | ICD-10-CM | POA: Diagnosis not present

## 2015-08-05 DIAGNOSIS — N189 Chronic kidney disease, unspecified: Secondary | ICD-10-CM | POA: Diagnosis not present

## 2015-08-05 DIAGNOSIS — Z7901 Long term (current) use of anticoagulants: Secondary | ICD-10-CM | POA: Diagnosis not present

## 2015-08-05 DIAGNOSIS — E785 Hyperlipidemia, unspecified: Secondary | ICD-10-CM | POA: Diagnosis not present

## 2015-09-13 ENCOUNTER — Ambulatory Visit (INDEPENDENT_AMBULATORY_CARE_PROVIDER_SITE_OTHER): Payer: Medicare Other | Admitting: Pharmacist Clinician (PhC)/ Clinical Pharmacy Specialist

## 2015-09-13 DIAGNOSIS — I4891 Unspecified atrial fibrillation: Secondary | ICD-10-CM | POA: Diagnosis not present

## 2015-09-13 DIAGNOSIS — Z5181 Encounter for therapeutic drug level monitoring: Secondary | ICD-10-CM | POA: Diagnosis not present

## 2015-09-13 LAB — POCT INR: INR: 2.9

## 2015-10-04 ENCOUNTER — Other Ambulatory Visit: Payer: Self-pay | Admitting: Interventional Cardiology

## 2015-10-20 ENCOUNTER — Telehealth: Payer: Self-pay | Admitting: Interventional Cardiology

## 2015-10-20 NOTE — Telephone Encounter (Signed)
New message      Want Dr Tamala Julian to know she has been in AFIB for 4 days.  Please advise

## 2015-10-20 NOTE — Telephone Encounter (Signed)
Returned pt call. Pt sts that she has been in Afib for the last 4 days. Pt sts that she can tell because of the pounding in her chest. She has not checked her heart rate. Pt denies, sob, fatigue, swelling or chest pain. Pt is taking diltiazem and labetalol as prescribed.adv pt that I will update Dr.Smith and call back with his recommendation. Pt verbalized understanding.

## 2015-10-20 NOTE — Telephone Encounter (Signed)
Follow up     Pt returning a call to the nurse

## 2015-10-20 NOTE — Telephone Encounter (Signed)
Returned pt call.  The phone was answered by a man, I asked to speak with the pt Mrs.Bultman several times, identified my self and told him I was calling from [facility] Dr.Smith's office. First he stated that she was not there.then he asked me to hold why he got a hold of her, I could her him speaking with a woman in the background, he asked her if she was trying to reach some one and repeated our office number and she said no. I asked the man if he was Mr.Humbarger, he says he does not know what I'm talking about and disconnected the call   Called again no answer

## 2015-10-21 ENCOUNTER — Ambulatory Visit (INDEPENDENT_AMBULATORY_CARE_PROVIDER_SITE_OTHER): Payer: Medicare Other | Admitting: Cardiovascular Disease

## 2015-10-21 ENCOUNTER — Other Ambulatory Visit: Payer: Medicare Other

## 2015-10-21 ENCOUNTER — Encounter: Payer: Self-pay | Admitting: *Deleted

## 2015-10-21 ENCOUNTER — Encounter: Payer: Medicare Other | Admitting: Cardiovascular Disease

## 2015-10-21 VITALS — BP 128/75 | HR 81 | Ht 64.5 in | Wt 149.8 lb

## 2015-10-21 DIAGNOSIS — I481 Persistent atrial fibrillation: Secondary | ICD-10-CM

## 2015-10-21 DIAGNOSIS — I4819 Other persistent atrial fibrillation: Secondary | ICD-10-CM

## 2015-10-21 DIAGNOSIS — Z01810 Encounter for preprocedural cardiovascular examination: Secondary | ICD-10-CM | POA: Diagnosis not present

## 2015-10-21 DIAGNOSIS — Z01812 Encounter for preprocedural laboratory examination: Secondary | ICD-10-CM | POA: Diagnosis not present

## 2015-10-21 DIAGNOSIS — Z5181 Encounter for therapeutic drug level monitoring: Secondary | ICD-10-CM | POA: Diagnosis not present

## 2015-10-21 LAB — BASIC METABOLIC PANEL
BUN: 30 mg/dL — AB (ref 7–25)
CHLORIDE: 106 mmol/L (ref 98–110)
CO2: 24 mmol/L (ref 20–31)
CREATININE: 1.89 mg/dL — AB (ref 0.60–0.88)
Calcium: 9.1 mg/dL (ref 8.6–10.4)
Glucose, Bld: 164 mg/dL — ABNORMAL HIGH (ref 65–99)
Potassium: 3.6 mmol/L (ref 3.5–5.3)
Sodium: 141 mmol/L (ref 135–146)

## 2015-10-21 LAB — CBC WITH DIFFERENTIAL/PLATELET
BASOS ABS: 0 10*3/uL (ref 0.0–0.1)
BASOS PCT: 0 % (ref 0–1)
EOS ABS: 0.1 10*3/uL (ref 0.0–0.7)
EOS PCT: 1 % (ref 0–5)
HCT: 34.6 % — ABNORMAL LOW (ref 36.0–46.0)
Hemoglobin: 11.8 g/dL — ABNORMAL LOW (ref 12.0–15.0)
Lymphocytes Relative: 16 % (ref 12–46)
Lymphs Abs: 1 10*3/uL (ref 0.7–4.0)
MCH: 28.2 pg (ref 26.0–34.0)
MCHC: 34.1 g/dL (ref 30.0–36.0)
MCV: 82.6 fL (ref 78.0–100.0)
MPV: 9.5 fL (ref 8.6–12.4)
Monocytes Absolute: 0.5 10*3/uL (ref 0.1–1.0)
Monocytes Relative: 9 % (ref 3–12)
NEUTROS PCT: 74 % (ref 43–77)
Neutro Abs: 4.5 10*3/uL (ref 1.7–7.7)
Platelets: 181 10*3/uL (ref 150–400)
RBC: 4.19 MIL/uL (ref 3.87–5.11)
RDW: 14.5 % (ref 11.5–15.5)
WBC: 6.1 10*3/uL (ref 4.0–10.5)

## 2015-10-21 LAB — PROTIME-INR
INR: 2.64 — AB (ref <1.50)
PROTHROMBIN TIME: 28.6 s — AB (ref 11.6–15.2)

## 2015-10-21 MED ORDER — DILTIAZEM HCL ER COATED BEADS 180 MG PO CP24: 180.0000 mg | ORAL_CAPSULE | Freq: Every day | ORAL | Status: DC

## 2015-10-21 NOTE — Progress Notes (Signed)
Patient came in for an EKG due to an extended afib episode.  Patient c/o some sob, some left side soreness, trouble sleeping.  EKG shows afib.  Advised the DOD, Dr Burt Knack.  Orders received CBC,BMET, INR.; Cardioversion;  Echocardiogram; F/U with Dr Tamala Julian in 3 weeks.

## 2015-10-21 NOTE — Patient Instructions (Addendum)
Your physician has recommended you make the following change in your medication:   1) Stop Diltiazem 60mg  2) Start Diltiazem CD 180mg  daily  .   Preprocedure labs today:  CBC, BMET, INR   Your physician has requested that you have an echocardiogram. Echocardiography is a painless test that uses sound waves to create images of your heart. It provides your doctor with information about the size and shape of your heart and how well your heart's chambers and valves are working. This procedure takes approximately one hour. There are no restrictions for this procedure.    Your physician recommends that you schedule a follow-up appointment in: 3 weeks with Dr Tamala Julian   Your provider has recommended a cardioversion.   You are scheduled for a cardioversion on 10/24/15   at  1:00pm with arrival at 11:00am with Dr. Debara Pickett  or associates. Please go to [facility] 2nd [place] Stay at 11:00am .  Enter through the [place] not have any food or drink after midnight the night before .  You may take your medicines with a sip of water on the day of your procedure except NO LASIX and GLIPIZIDE.  You will need someone to drive you home following your procedure.   Call the [place] office at [phone] if you have any questions, problems or concerns.     Electrical Cardioversion Electrical cardioversion is the delivery of a jolt of electricity to change the rhythm of the heart. Sticky patches or metal paddles are placed on the chest to deliver the electricity from a device. This is done to restore a normal rhythm. A rhythm that is too fast or not regular keeps the heart from pumping well. Electrical cardioversion is done in an emergency if:   There is low or no blood pressure as a result of the heart rhythm.   Normal rhythm must be restored as fast as possible to protect the brain and heart from further damage.   It may save a life. Cardioversion  may be done for heart rhythms that are not immediately life threatening, such as atrial fibrillation or flutter, in which:   The heart is beating too fast or is not regular.   Medicine to change the rhythm has not worked.   It is safe to wait in order to allow time for preparation.  Symptoms of the abnormal rhythm are bothersome.  The risk of stroke and other serious problems can be reduced.  LET [facility] CARE PROVIDER KNOW ABOUT:   Any allergies you have.  All medicines you are taking, including vitamins, herbs, eye drops, creams, and over-the-counter medicines.  Previous problems you or members of your family have had with the use of anesthetics.   Any blood disorders you have.   Previous surgeries you have had.   Medical conditions you have.   RISKS AND COMPLICATIONS  Generally, this is a safe procedure. However, problems can occur and include:   Breathing problems related to the anesthetic used.  A blood clot that breaks free and travels to other parts of your body. This could cause a stroke or other problems. The risk of this is lowered by use of blood-thinning medicine (anticoagulant) prior to the procedure.  Cardiac arrest (rare).   BEFORE THE PROCEDURE   You may have tests to detect blood clots in your heart and to evaluate heart function.  You may start taking anticoagulants so your blood does not clot as  easily.   Medicines may be given to help stabilize your heart rate and rhythm.   PROCEDURE  You will be given medicine through an IV tube to reduce discomfort and make you sleepy (sedative).   An electrical shock will be delivered.   AFTER THE PROCEDURE Your heart rhythm will be watched to make sure it does not change. You will need someone to drive you home.

## 2015-10-21 NOTE — Telephone Encounter (Signed)
New Message  Follow Up. Pt daughter called states that the patient will need an appt today. Pt is still in Afib and hasn't heard anything back from the nurse. Please call back to discuss.

## 2015-10-21 NOTE — Telephone Encounter (Signed)
She needs to be seen. I agree with her daughter it should be today!

## 2015-10-21 NOTE — Telephone Encounter (Signed)
Patient reports not feeling well.  Reports HR as high as 150s, chest soreness and some SOB. Reports that she took an extra 1/2 tablet of Diltiazem (30 mg total) last night. Arranged for patient to come by the office this morning for EKG. She is agreeable to plan.

## 2015-10-23 ENCOUNTER — Encounter: Payer: Self-pay | Admitting: Cardiovascular Disease

## 2015-10-23 NOTE — Progress Notes (Signed)
This encounter was created in error - please disregard.

## 2015-10-23 NOTE — Progress Notes (Signed)
Cardiology Office Note Date:  10/23/2015   ID:  Ariel Avila, DOB 15-Jan-1930, MRN [id number]  PCP:  Anthoney Harada, MD  Cardiologist:  Daneen Schick, MD    Chief Complaint  Patient presents with  . Palpitations   History of Present Illness: Ariel Avila is a 81 y.o. female who presents for evaluation of shortness of breath, chest discomfort, and palpitations.   She has a history of paroxysmal atrial fibrillation, but has maintained sinus rhythm predominately. Over the past 5 days she has been in atrial fibrillation with associated symptoms as listed above. Her daughter, Godfrey Pick, is a rapid response nurse and has monitored the patient's vital signs. The patient had a heart rate up to 150 bpm last night. She had difficulty sleeping and could not lie on her left side. She felt her chest pounding and was short of breath. She was give extra cardizem and is feeling better today. Notes exercise intolerance now x 5 days and generalized weakness. No lightheadedness or syncope.  The patient has been anticoagulated with warfarin and has had stable/therapeutic INR's x many months. She denies bleeding problems.    Past Medical History  Diagnosis Date  . Hypertension   . Allergy   . GERD (gastroesophageal reflux disease)   . Hyperlipidemia   . Osteoporosis   . Chronic bronchitis ([place])     "lately" (06/23/2014)  . Type II diabetes mellitus ([place])   . H/O hiatal hernia   . Arthritis     "fingers and back" (06/23/2014)  . Chronic kidney disease (CKD), stage III (moderate)   . Basal cell carcinoma   . Paroxysmal a-fib [facility])     Past Surgical History  Procedure Laterality Date  . Abdominal hysterectomy    . Appendectomy    . Basal cell carcinoma excision      "face and nose"    Current Outpatient Prescriptions  Medication Sig Dispense Refill  . cloNIDine (CATAPRES) 0.2 MG tablet Take 0.2 mg by mouth at bedtime.     Marland Kitchen desloratadine (CLARINEX) 5 MG tablet Take 5 mg by mouth  daily.    . ferrous sulfate 325 (65 FE) MG tablet Take 325 mg by mouth 2 (two) times daily.     . fluticasone (FLONASE) 50 MCG/ACT nasal spray Place 1 spray into both nostrils daily as needed for allergies or rhinitis.     . furosemide (LASIX) 20 MG tablet Take 2 tablets (40 mg total) by mouth daily. 60 tablet 3  . glimepiride (AMARYL) 1 MG tablet Take 1 tablet (1 mg total) by mouth daily with breakfast. 30 tablet 1  . hydrALAZINE (APRESOLINE) 50 MG tablet Take 100 mg by mouth 3 (three) times daily.     Marland Kitchen HYDROcodone-homatropine (HYCODAN) 5-1.5 MG/5ML syrup Take 5 mLs by mouth every 6 (six) hours as needed for cough. (Patient not taking: Reported on 10/21/2015) 120 mL 0  . lovastatin (MEVACOR) 40 MG tablet Take 1 tablet (40 mg total) by mouth at bedtime. 30 tablet 10  . Multiple Vitamin (MULTIVITAMIN WITH MINERALS) TABS tablet Take 1 tablet by mouth daily. One a Day Women's    . pantoprazole (PROTONIX) 40 MG tablet Take 40 mg by mouth daily.    . Polyvinyl Alcohol-Povidone (REFRESH OP) Place 1 drop into both eyes 2 (two) times daily as needed (for dry eyes).    . warfarin (COUMADIN) 2 MG tablet TAKE AS DIRECTED BY  COUMADIN  CLINIC (Patient taking differently: TAKE 2 1/2 TABLETS BY MOUTH  DAILY BEFORE SUPPER OR AS DIRECTED BY  COUMADIN  CLINIC) 75 tablet 3  . diltiazem (CARDIZEM CD) 180 MG 24 hr capsule Take 1 capsule (180 mg total) by mouth daily. 30 capsule 6  . labetalol (NORMODYNE) 300 MG tablet Take 300 mg by mouth 2 (two) times daily.    . Multiple Vitamins-Minerals (HAIR/SKIN/NAILS/BIOTIN) TABS Take 1 tablet by mouth daily.    . vitamin A 8000 UNIT capsule Take 8,000 Units by mouth daily.     No current facility-administered medications for this visit.    Allergies:   Levaquin and Sulfa antibiotics   Social History:  The patient  reports that she has never smoked. She has never used smokeless tobacco. She reports that she does not drink alcohol or use illicit drugs.   Family History:   The patient's  family history includes Heart disease in her brother and father; Tuberculosis in her mother.    ROS:  Please see the history of present illness.  All other systems are reviewed and negative.    PHYSICAL EXAM: VS:  BP 128/75 mmHg  Pulse 81  Ht 5' 4.5" (1.638 m)  Wt 149 lb 12.8 oz (67.949 kg)  BMI 25.33 kg/m2 , BMI Body mass index is 25.33 kg/(m^2). GEN: Well nourished, well developed, in no acute distress HEENT: normal Neck: no JVD, no masses. No carotid bruits Cardiac: irregularly irregular without murmur or gallop           Respiratory:  clear to auscultation bilaterally, normal work of breathing GI: soft, nontender, nondistended, + BS MS: no deformity or atrophy Ext: no pretibial edema, pedal pulses 2+= bilaterally Skin: warm and dry, no rash Neuro:  Strength and sensation are intact Psych: euthymic mood, full affect  EKG:  EKG is ordered today. The ekg ordered today shows atrial fibrillation with controlled ventricular response 69 bpm, nonspecific ST abnormality  Recent Labs: 12/04/2014: ALT 17 10/21/2015: BUN 30*; Creat 1.89*; Hemoglobin 11.8*; Platelets 181; Potassium 3.6; Sodium 141   Lipid Panel  No results found for: CHOL, TRIG, HDL, CHOLHDL, VLDL, LDLCALC, LDLDIRECT    Wt Readings from Last 3 Encounters:  10/21/15 149 lb 12.8 oz (67.949 kg)  11/22/14 152 lb 12.8 oz (69.31 kg)  08/17/14 156 lb (70.761 kg)     Cardiac Studies Reviewed: 2D echo: Study Conclusions  - Left ventricle: The cavity size was normal. There was mild concentric hypertrophy. Systolic function was normal. The estimated ejection fraction was in the range of 55% to 60%. Wall motion was normal; there were no regional wall motion abnormalities. - Mitral valve: There was mild regurgitation. - Left atrium: The atrium was mildly dilated. - Tricuspid valve: There was trivial regurgitation. - Pulmonic valve: There was trivial regurgitation.  ASSESSMENT AND PLAN: Atrial  fibrillation with RVR, symptomatic. HR better controlled now. Will change Ca channel blocker to cardizem CD 120 mg daily. Reviewed options - antiarrhythmic versus cardioversion - with the patient and her daughter. Since it is rare for her to have AF, I would favor electrical cardioversion. Reviewed risks, indications, alternatives with the patient who understands and agrees to proceed. Labs show therapeutic INR of 2.64 and INR's over last 4 months have been 2.4-2.9. Will arrange FU with Dr Tamala Julian 3 weeks.  Current medicines are reviewed with the patient today.  The patient does not have concerns regarding medicines.  Labs/ tests ordered today include:   Orders Placed This Encounter  Procedures  . CBC w/Diff  . Basic Metabolic Panel (BMET)  .  INR/PT  . EKG 12-Lead  . Echocardiogram    Disposition:   FU as outlined  Signed, Sherren Mocha, MD  10/23/2015 8:29 PM    [facility] [place], [place], [place]  [address] Phone: [phone]; Fax: [phone]

## 2015-10-24 ENCOUNTER — Ambulatory Visit (HOSPITAL_COMMUNITY): Payer: Medicare Other | Admitting: Certified Registered Nurse Anesthetist

## 2015-10-24 ENCOUNTER — Encounter (HOSPITAL_COMMUNITY)
Admission: RE | Disposition: A | Discharge status: Home / Self Care | Payer: Self-pay | Source: Ambulatory Visit | Attending: Internal Medicine

## 2015-10-24 ENCOUNTER — Encounter (HOSPITAL_COMMUNITY): Payer: Self-pay | Admitting: *Deleted

## 2015-10-24 ENCOUNTER — Ambulatory Visit (HOSPITAL_COMMUNITY)
Admission: RE | Admit: 2015-10-24 | Discharge: 2015-10-24 | Disposition: A | Discharge status: Home / Self Care | Payer: Medicare Other | Source: Ambulatory Visit | Attending: Internal Medicine | Admitting: Internal Medicine

## 2015-10-24 DIAGNOSIS — E119 Type 2 diabetes mellitus without complications: Secondary | ICD-10-CM | POA: Diagnosis not present

## 2015-10-24 DIAGNOSIS — I481 Persistent atrial fibrillation: Secondary | ICD-10-CM

## 2015-10-24 DIAGNOSIS — I4891 Unspecified atrial fibrillation: Secondary | ICD-10-CM | POA: Diagnosis not present

## 2015-10-24 DIAGNOSIS — I129 Hypertensive chronic kidney disease with stage 1 through stage 4 chronic kidney disease, or unspecified chronic kidney disease: Secondary | ICD-10-CM | POA: Diagnosis not present

## 2015-10-24 DIAGNOSIS — I10 Essential (primary) hypertension: Secondary | ICD-10-CM | POA: Diagnosis not present

## 2015-10-24 DIAGNOSIS — N183 Chronic kidney disease, stage 3 (moderate): Secondary | ICD-10-CM | POA: Diagnosis not present

## 2015-10-24 DIAGNOSIS — I4819 Other persistent atrial fibrillation: Secondary | ICD-10-CM

## 2015-10-24 HISTORY — PX: CARDIOVERSION: SHX1299

## 2015-10-24 LAB — GLUCOSE, CAPILLARY: Glucose-Capillary: 170 mg/dL — ABNORMAL HIGH (ref 65–99)

## 2015-10-24 SURGERY — CARDIOVERSION
Anesthesia: Monitor Anesthesia Care

## 2015-10-24 MED ORDER — PROPOFOL 10 MG/ML IV BOLUS
INTRAVENOUS | Status: DC | PRN
  Administered 2015-10-24: 20 mg via INTRAVENOUS
  Administered 2015-10-24: 50 mg via INTRAVENOUS

## 2015-10-24 MED ORDER — SODIUM CHLORIDE 0.9 % IJ SOLN: 3.0000 mL | Freq: Two times a day (BID) | INTRAMUSCULAR | Status: DC

## 2015-10-24 MED ORDER — DILTIAZEM HCL ER COATED BEADS 240 MG PO CP24: 240.0000 mg | ORAL_CAPSULE | Freq: Every day | ORAL | Status: DC

## 2015-10-24 MED ORDER — LIDOCAINE HCL (CARDIAC) 20 MG/ML IV SOLN
INTRAVENOUS | Status: DC | PRN
  Administered 2015-10-24: 60 mg via INTRAVENOUS

## 2015-10-24 MED ORDER — SODIUM CHLORIDE 0.9 % IV SOLN
250.0000 mL | INTRAVENOUS | Status: DC
  Administered 2015-10-24: 250 mL via INTRAVENOUS
  Administered 2015-10-24: 12:00:00 via INTRAVENOUS

## 2015-10-24 MED ORDER — SODIUM CHLORIDE 0.9 % IJ SOLN: 3.0000 mL | INTRAMUSCULAR | Status: DC | PRN

## 2015-10-24 NOTE — CV Procedure (Signed)
    CARDIOVERSION NOTE  Procedure: Electrical Cardioversion Indications:  Atrial Fibrillation  Procedure Details:  Consent: Risks of procedure as well as the alternatives and risks of each were explained to the (patient/caregiver).  Consent for procedure obtained.  Time Out: Verified patient identification, verified procedure, site/side was marked, verified correct patient position, special equipment/implants available, medications/allergies/relevent history reviewed, required imaging and test results available.  Performed  Patient placed on cardiac monitor, pulse oximetry, supplemental oxygen as necessary.  Sedation given: Propofol per anesthesia Pacer pads placed anterior and posterior chest.  Cardioverted 3 time(s).  Cardioverted at 120J, 150J and 200J biphasic.  Impression: Findings: Post procedure EKG shows: Atrial Fibrillation Complications: None Patient did tolerate procedure well.  Plan: 1. Unsuccessful DCCV. Very short period of sinus, but no real evidence for conversion. 2. Increase cardizem CD to 240 mg daily for better rate control. 3. Would advise patient discuss options of rate versus rhythm control with her primary cardiologist. 4. Continue warfarin.  Time Spent Directly with the Patient:  30 minutes   Pixie Casino, MD, [facility] Attending Cardiologist [place] 10/24/2015, 12:28 PM

## 2015-10-24 NOTE — Discharge Instructions (Signed)
Electrical Cardioversion, Care After °Refer to this sheet in the next few weeks. These instructions provide you with information on caring for yourself after your procedure. Your health care provider may also give you more specific instructions. Your treatment has been planned according to current medical practices, but problems sometimes occur. Call your health care provider if you have any problems or questions after your procedure. °WHAT TO EXPECT AFTER THE PROCEDURE °After your procedure, it is typical to have the following sensations: °· Some redness on the skin where the shocks were delivered. If this is tender, a sunburn lotion or hydrocortisone cream may help. °· Possible return of an abnormal heart rhythm within hours or days after the procedure. °HOME CARE INSTRUCTIONS °· Take medicines only as directed by your health care provider. Be sure you understand how and when to take your medicine. °· Learn how to feel your pulse and check it often. °· Limit your activity for 48 hours after the procedure or as directed by your health care provider. °· Avoid or minimize caffeine and other stimulants as directed by your health care provider. °SEEK MEDICAL CARE IF: °· You feel like your heart is beating too fast or your pulse is not regular. °· You have any questions about your medicines. °· You have bleeding that will not stop. °SEEK IMMEDIATE MEDICAL CARE IF: °· You are dizzy or feel faint. °· It is hard to breathe or you feel short of breath. °· There is a change in discomfort in your chest. °· Your speech is slurred or you have trouble moving an arm or leg on one side of your body. °· You get a serious muscle cramp that does not go away. °· Your fingers or toes turn cold or blue. °  °This information is not intended to replace advice given to you by your health care provider. Make sure you discuss any questions you have with your health care provider. °  °Document Released: 10/07/2013 Document Revised: 01/07/2015  Document Reviewed: 10/07/2013 °Elsevier Interactive Patient Education ©2016 [employer] ° °

## 2015-10-24 NOTE — Anesthesia Preprocedure Evaluation (Addendum)
Anesthesia Evaluation  Patient identified by MRN, date of birth, ID band Patient awake    Reviewed: Allergy & Precautions, NPO status , Patient's Chart, lab work & pertinent test results  Airway Mallampati: II  TM Distance: >3 FB Neck ROM: Full    Dental no notable dental hx.    Pulmonary neg pulmonary ROS,    Pulmonary exam normal breath sounds clear to auscultation       Cardiovascular hypertension, + dysrhythmias Atrial Fibrillation  Rhythm:Irregular Rate:Normal  Left ventricle: The cavity size was normal. There was mild concentric hypertrophy. Systolic function was normal. The estimated ejection fraction was in the range of 55% to 60%. Wall motion was normal; there were no regional wall motion abnormalities. - Mitral valve: There was mild regurgitation. - Left atrium: The atrium was mildly dilated. - Tricuspid valve: There was trivial regurgitation. - Pulmonic valve: There was trivial regurgitation.     Neuro/Psych negative neurological ROS  negative psych ROS   GI/Hepatic negative GI ROS, Neg liver ROS,   Endo/Other  diabetes  Renal/GU Renal InsufficiencyRenal disease  negative genitourinary   Musculoskeletal negative musculoskeletal ROS (+)   Abdominal   Peds negative pediatric ROS (+)  Hematology negative hematology ROS (+)   Anesthesia Other Findings   Reproductive/Obstetrics negative OB ROS                            Anesthesia Physical Anesthesia Plan  ASA: III  Anesthesia Plan: MAC   Post-op Pain Management:    Induction: Intravenous  Airway Management Planned: Mask  Additional Equipment:   Intra-op Plan:   Post-operative Plan:   Informed Consent: I have reviewed the patients History and Physical, chart, labs and discussed the procedure including the risks, benefits and alternatives for the proposed anesthesia with the patient or authorized  representative who has indicated his/her understanding and acceptance.   Dental advisory given  Plan Discussed with: CRNA and Surgeon  Anesthesia Plan Comments:         Anesthesia Quick Evaluation

## 2015-10-24 NOTE — Transfer of Care (Signed)
Immediate Anesthesia Transfer of Care Note  Patient: Ariel Avila  Procedure(s) Performed: Procedure(s): CARDIOVERSION (N/A)  Patient Location: Endoscopy Unit  Anesthesia Type:MAC  Level of Consciousness: awake, alert , oriented and patient cooperative  Airway & Oxygen Therapy: Patient Spontanous Breathing and Patient connected to nasal cannula oxygen  Post-op Assessment: Report given to RN and Post -op Vital signs reviewed and stable  Post vital signs: Reviewed and stable  Last Vitals:  Filed Vitals:   10/24/15 1116  BP: 140/72  Pulse: 111  Temp: 36.8 C  Resp: 21    Complications: No apparent anesthesia complications

## 2015-10-24 NOTE — H&P (Signed)
     INTERVAL PROCEDURE H&P  History and Physical Interval Note:  10/24/2015 12:09 PM  Ariel Avila has presented today for their planned procedure. The various methods of treatment have been discussed with the patient and family. After consideration of risks, benefits and other options for treatment, the patient has consented to the procedure.  The patients' outpatient history has been reviewed, patient examined, and no change in status from most recent office note within the past 30 days. I have reviewed the patients' chart and labs and will proceed as planned. Questions were answered to the patient's satisfaction.   Pixie Casino, MD, [facility] Attending Cardiologist [place] 10/24/2015, 12:09 PM

## 2015-10-24 NOTE — Anesthesia Postprocedure Evaluation (Signed)
  Anesthesia Post-op Note  Patient: Ariel Avila  Procedure(s) Performed: Procedure(s): CARDIOVERSION (N/A)  Patient Location: Endoscopy Unit  Anesthesia Type:MAC  Level of Consciousness: awake, alert , oriented and patient cooperative  Airway and Oxygen Therapy: Patient Spontanous Breathing  Post-op Pain: none  Post-op Assessment: Post-op Vital signs reviewed, Patient's Cardiovascular Status Stable, Respiratory Function Stable, Patent Airway, No signs of Nausea or vomiting, Adequate PO intake, Pain level controlled, No headache and No backache              Post-op Vital Signs: Reviewed and stable  Last Vitals:  Filed Vitals:   10/24/15 1116  BP: 140/72  Pulse: 111  Temp: 36.8 C  Resp: 21    Complications: No apparent anesthesia complications

## 2015-10-25 ENCOUNTER — Encounter (HOSPITAL_COMMUNITY): Payer: Self-pay | Admitting: Internal Medicine

## 2015-10-25 ENCOUNTER — Ambulatory Visit (INDEPENDENT_AMBULATORY_CARE_PROVIDER_SITE_OTHER): Payer: Medicare Other

## 2015-10-25 ENCOUNTER — Ambulatory Visit (INDEPENDENT_AMBULATORY_CARE_PROVIDER_SITE_OTHER): Payer: Medicare Other | Admitting: Interventional Cardiology

## 2015-10-25 VITALS — BP 138/80 | HR 85 | Ht 64.5 in | Wt 153.0 lb

## 2015-10-25 DIAGNOSIS — I4891 Unspecified atrial fibrillation: Secondary | ICD-10-CM

## 2015-10-25 DIAGNOSIS — I10 Essential (primary) hypertension: Secondary | ICD-10-CM

## 2015-10-25 DIAGNOSIS — Z5181 Encounter for therapeutic drug level monitoring: Secondary | ICD-10-CM | POA: Diagnosis not present

## 2015-10-25 LAB — COMPREHENSIVE METABOLIC PANEL
ALK PHOS: 60 U/L (ref 33–130)
ALT: 13 U/L (ref 6–29)
AST: 15 U/L (ref 10–35)
Albumin: 3.8 g/dL (ref 3.6–5.1)
BUN: 30 mg/dL — ABNORMAL HIGH (ref 7–25)
CALCIUM: 9.2 mg/dL (ref 8.6–10.4)
CO2: 26 mmol/L (ref 20–31)
Chloride: 105 mmol/L (ref 98–110)
Creat: 1.99 mg/dL — ABNORMAL HIGH (ref 0.60–0.88)
Glucose, Bld: 183 mg/dL — ABNORMAL HIGH (ref 65–99)
POTASSIUM: 4 mmol/L (ref 3.5–5.3)
Sodium: 141 mmol/L (ref 135–146)
TOTAL PROTEIN: 5.8 g/dL — AB (ref 6.1–8.1)
Total Bilirubin: 0.5 mg/dL (ref 0.2–1.2)

## 2015-10-25 LAB — POCT INR: INR: 3.8

## 2015-10-25 LAB — TSH: TSH: 3.311 u[IU]/mL (ref 0.350–4.500)

## 2015-10-25 MED ORDER — AMIODARONE HCL 400 MG PO TABS: 400.0000 mg | ORAL_TABLET | Freq: Two times a day (BID) | ORAL | Status: DC

## 2015-10-25 NOTE — Patient Instructions (Addendum)
Medication Instructions:  Your physician has recommended you make the following change in your medication:  1. START Amiodarone 400mg  twice a day for 2 WEEKS and then DECREASE to 100mg  daily (we will send this prescription at your next appointment) 2. CONTINUE Cardizem CD 180mg  once daily  Labwork: Your physician recommends that you have lab work today: TSH and CMP  Testing/Procedures: No new orders.   Follow-Up: Your physician recommends that you schedule a follow-up appointment in: 7-10 days with FLEX provider for EKG and possible medication adjustments   Any Other Special Instructions Will Be Listed Below (If Applicable).     If you need a refill on your cardiac medications before your next appointment, please call your pharmacy.

## 2015-10-25 NOTE — H&P (Signed)
10/25/2015 Ariel Avila   24-Aug-1930  [id number]  Primary Physician Anthoney Harada, MD Primary Cardiologist: Dr. Tamala Julian   Reason for Visit/CC: F/U for Persistent Atrial Fibrillation  HPI:  The patient is a 81 year old female, followed by Dr. Tamala Julian, with a history of atrial fibrillation, essential hypertension and chronic kidney disease, baseline SCr ~1.8. She is also on chronic anticoagulation therapy with Coumadin for her atrial fibrillation. 2D echo 05/2014 demonstrated normal LVEF of 55-60% and mild LAE.   She presents to clinic today for follow-up of her atrial fibrillation. She was seen by Dr. Burt Knack on 10/23/2015 as an add on with complaints of a 5 day h/o increased fatigue, DOE and tachypalpitations.  EKG demonstrated recurrent atrial fibrillation. Dr. Burt Knack increased her Cardizem to 180 mg daily. She reported full compliance with Coumadin and INR was therapeutic. OP DCCV was elected. This was attempted at [facility] 10/24/15 but was unsuccessful despite multiple attempts. She presents back to clinic today for further recommendations, regarding continued rate control vs rhythm control strategy.   EKG today in follow-up shows continued atrial fibrillation with a rate in the mid 80s. QT/QTc is 402/478 ms. BP is 138/80. Despite rate control, she continues to be bothered with fatigue and mild dyspnea with exertion.     Current Outpatient Prescriptions  Medication Sig Dispense Refill  . cloNIDine (CATAPRES) 0.2 MG tablet Take 0.2 mg by mouth at bedtime.     Marland Kitchen desloratadine (CLARINEX) 5 MG tablet Take 5 mg by mouth daily.    Marland Kitchen diltiazem (CARDIZEM CD) 180 MG 24 hr capsule Take 180 mg by mouth daily.    . ferrous sulfate 325 (65 FE) MG tablet Take 325 mg by mouth 2 (two) times daily.     . fluticasone (FLONASE) 50 MCG/ACT nasal spray Place 1 spray into both nostrils daily as needed for allergies or rhinitis.     . furosemide (LASIX) 20 MG tablet Take 2 tablets (40 mg total) by mouth daily.  60 tablet 3  . glimepiride (AMARYL) 1 MG tablet Take 1 tablet (1 mg total) by mouth daily with breakfast. 30 tablet 1  . hydrALAZINE (APRESOLINE) 50 MG tablet Take 100 mg by mouth 3 (three) times daily.     Marland Kitchen labetalol (NORMODYNE) 300 MG tablet Take 300 mg by mouth 2 (two) times daily.    Marland Kitchen lovastatin (MEVACOR) 40 MG tablet Take 1 tablet (40 mg total) by mouth at bedtime. 30 tablet 10  . Multiple Vitamin (MULTIVITAMIN WITH MINERALS) TABS tablet Take 1 tablet by mouth daily. One a Day Women's    . Multiple Vitamins-Minerals (HAIR/SKIN/NAILS/BIOTIN) TABS Take 1 tablet by mouth daily.    . pantoprazole (PROTONIX) 40 MG tablet Take 40 mg by mouth daily.    . Polyvinyl Alcohol-Povidone (REFRESH OP) Place 1 drop into both eyes 2 (two) times daily as needed (for dry eyes).    . vitamin A 8000 UNIT capsule Take 8,000 Units by mouth daily.    Marland Kitchen warfarin (COUMADIN) 2 MG tablet TAKE AS DIRECTED BY  COUMADIN  CLINIC (Patient taking differently: TAKE 2 1/2 TABLETS BY MOUTH DAILY BEFORE SUPPER OR AS DIRECTED BY  COUMADIN  CLINIC) 75 tablet 3  . amiodarone (PACERONE) 400 MG tablet Take 1 tablet (400 mg total) by mouth 2 (two) times daily. 28 tablet 0   No current facility-administered medications for this visit.    Allergies  Allergen Reactions  . Levaquin [Levofloxacin] Hives, Itching and Other (See Comments)    insomnia  .  Sulfa Antibiotics Rash    Social History   Social History  . Marital Status: Married    Spouse Name: N/A  . Number of Children: N/A  . Years of Education: N/A   Occupational History  . Not on file.   Social History Main Topics  . Smoking status: Never Smoker   . Smokeless tobacco: Never Used  . Alcohol Use: No  . Drug Use: No  . Sexual Activity: No   Other Topics Concern  . Not on file   Social History Narrative     Review of Systems: General: negative for chills, fever, night sweats or weight changes.  Cardiovascular: negative for chest pain, dyspnea on  exertion, edema, orthopnea, palpitations, paroxysmal nocturnal dyspnea or shortness of breath Dermatological: negative for rash Respiratory: negative for cough or wheezing Urologic: negative for hematuria Abdominal: negative for nausea, vomiting, diarrhea, bright red blood per rectum, melena, or hematemesis Neurologic: negative for visual changes, syncope, or dizziness All other systems reviewed and are otherwise negative except as noted above.    Blood pressure 138/80, pulse 85, height 5' 4.5" (1.638 m), weight 153 lb (69.4 kg).  General appearance: alert, cooperative and no distress Neck: no carotid bruit and no JVD Lungs: clear to auscultation bilaterally Heart: irregularly irregular rhythm and regular rate Extremities: no LEE Pulses: 2+ and symmetric Skin: warm and dry Neurologic: Grossly normal  EKG Atrial Fibrillation with a CVR, 85 bpm  ASSESSMENT AND PLAN:    1. Persistent Atrial Fibrillation: Symptomatic with fatigue and mild exertional dyspnea. She failed attempt at [place] 1 day ago. HR is well controlled on PO Cardiezem, 180 mg daily. Due to persistent symptoms of fatigue and DOE despite rate control, we have oppted to try rthythm control strategy with amiodarone with plans for repeat attemt at [place] in several weeks. Will load with 400 mg amiodarone BID x 2 weeks followed by 100 mg daily. Countinue Coumadin. Will need f/u EKG in 7-10 days. Will check TSH and repeat 2D Echo.  2. Medication Monitoring (Amiodarone New start): QT/QTc 402/478 ms. Will check TSH today and CMP to assess baseline thyroid and hepatic function, as well as electrolytes to ensure K is stable.  Will need f/u EKG in 7-10 days to reassess rhythm and QT interval.   3. Chronic Anticoagulation: Will arrange for INR check in our Coumadin Clinic today since we are initiating amiodarone, as she will need closer monitoring given potential drug interactions. She may need dose adjustments today. Pharacist will dose.    4. HTN: BP is controlled.     Lyda Jester PA-C 10/25/2015 11:12 AM   The patient has been seen in conjunction with Lyda Jester, PA-C. All aspects of care have been considered and discussed. The patient has been personally interviewed, examined, and all clinical data has been reviewed.   Multiple notes and the recent hospitalization for cardioversion were reviewed. All clinical data including EKGs were reviewed. Failed electrical cardioversion without any evidence of sinus rhythm. Unknown duration of atrial fibrillation prior to cardioversion.  After speaking with the patient, there is 3-6 months of exertional fatigue and dyspnea preceding the identification of atrial fibrillation. This raises a concern that AF was present longer than we anticipated. She now has good rate control on the current medical regimen but still complains of fatigue and dyspnea.  We discussed management options related to atrial fibrillation. Options discussed include: 1.  Medical therapy with rate control as we currently are doing. This option was unacceptable to the  patient because she feels limited.  2. Rhythm control after starting an antiarrhythmic agent and repeating electrical cardioversion. 3. Consideration of ablative therapy after referral to EP. Left atrial size was relatively small in 2015.  After discussion with the patient and family we have decided to embark upon rhythm control. We will load the patient with amiodarone over the next 4-6 weeks. Anticoagulation with Coumadin will be appropriately adjusted. Side effects of amiodarone and long-term monitoring were discussed.  We discussed potential that the patient may require pacemaker therapy should bradycardia developed post-cardioversion.  The patient will return for follow-up in 2 weeks at which time diltiazem therapy may need to be decreased if bradycardia is developing with amiodarone loading.  If we are unable to maintain normal sinus  rhythm on amiodarone suppression after cardioversion, consideration of ablation will then occur.

## 2015-10-26 ENCOUNTER — Telehealth: Payer: Self-pay | Admitting: *Deleted

## 2015-10-26 DIAGNOSIS — Z23 Encounter for immunization: Secondary | ICD-10-CM | POA: Diagnosis not present

## 2015-10-26 NOTE — Telephone Encounter (Signed)
Called Aline Brochure, pt's daughter, and have made here aware of the pt's lab results.  She was advised to let pt know to stay hydrated well with fluids.  Ms. Vevelyn Royals verbalized understanding

## 2015-10-26 NOTE — Telephone Encounter (Signed)
-----   Message from Consuelo Pandy, [place] sent at 10/26/2015  3:28 PM EDT ----- Liver function is ok. Potassium is normal.  Scr is chronically abnormal but BUN is up, she may be slightly dehydrated. Instruct her to stay well hydrated with fluids.

## 2015-10-27 ENCOUNTER — Other Ambulatory Visit (HOSPITAL_COMMUNITY): Payer: Medicare Other

## 2015-10-29 ENCOUNTER — Emergency Department (HOSPITAL_COMMUNITY): Payer: Medicare Other

## 2015-10-29 ENCOUNTER — Encounter (HOSPITAL_COMMUNITY): Payer: Self-pay | Admitting: *Deleted

## 2015-10-29 ENCOUNTER — Inpatient Hospital Stay (HOSPITAL_COMMUNITY)
Admission: EM | Admit: 2015-10-29 | Discharge: 2015-11-01 | DRG: 291 | Disposition: A | Discharge status: Home Health | Payer: Medicare Other | Attending: Internal Medicine | Admitting: Internal Medicine

## 2015-10-29 DIAGNOSIS — I4891 Unspecified atrial fibrillation: Secondary | ICD-10-CM | POA: Diagnosis not present

## 2015-10-29 DIAGNOSIS — Z8249 Family history of ischemic heart disease and other diseases of the circulatory system: Secondary | ICD-10-CM

## 2015-10-29 DIAGNOSIS — R062 Wheezing: Secondary | ICD-10-CM

## 2015-10-29 DIAGNOSIS — Z85828 Personal history of other malignant neoplasm of skin: Secondary | ICD-10-CM

## 2015-10-29 DIAGNOSIS — E1122 Type 2 diabetes mellitus with diabetic chronic kidney disease: Secondary | ICD-10-CM | POA: Diagnosis present

## 2015-10-29 DIAGNOSIS — N184 Chronic kidney disease, stage 4 (severe): Secondary | ICD-10-CM | POA: Diagnosis present

## 2015-10-29 DIAGNOSIS — K219 Gastro-esophageal reflux disease without esophagitis: Secondary | ICD-10-CM | POA: Diagnosis present

## 2015-10-29 DIAGNOSIS — R0602 Shortness of breath: Secondary | ICD-10-CM | POA: Diagnosis not present

## 2015-10-29 DIAGNOSIS — R059 Cough, unspecified: Secondary | ICD-10-CM

## 2015-10-29 DIAGNOSIS — E269 Hyperaldosteronism, unspecified: Secondary | ICD-10-CM | POA: Diagnosis present

## 2015-10-29 DIAGNOSIS — I5032 Chronic diastolic (congestive) heart failure: Secondary | ICD-10-CM

## 2015-10-29 DIAGNOSIS — I160 Hypertensive urgency: Secondary | ICD-10-CM | POA: Diagnosis present

## 2015-10-29 DIAGNOSIS — E785 Hyperlipidemia, unspecified: Secondary | ICD-10-CM | POA: Diagnosis present

## 2015-10-29 DIAGNOSIS — Z7901 Long term (current) use of anticoagulants: Secondary | ICD-10-CM | POA: Diagnosis not present

## 2015-10-29 DIAGNOSIS — R079 Chest pain, unspecified: Secondary | ICD-10-CM | POA: Diagnosis not present

## 2015-10-29 DIAGNOSIS — R0789 Other chest pain: Secondary | ICD-10-CM

## 2015-10-29 DIAGNOSIS — J9811 Atelectasis: Secondary | ICD-10-CM | POA: Diagnosis not present

## 2015-10-29 DIAGNOSIS — I130 Hypertensive heart and chronic kidney disease with heart failure and stage 1 through stage 4 chronic kidney disease, or unspecified chronic kidney disease: Secondary | ICD-10-CM | POA: Diagnosis not present

## 2015-10-29 DIAGNOSIS — N183 Chronic kidney disease, stage 3 unspecified: Secondary | ICD-10-CM | POA: Diagnosis present

## 2015-10-29 DIAGNOSIS — N189 Chronic kidney disease, unspecified: Secondary | ICD-10-CM

## 2015-10-29 DIAGNOSIS — I509 Heart failure, unspecified: Secondary | ICD-10-CM | POA: Diagnosis not present

## 2015-10-29 DIAGNOSIS — M1990 Unspecified osteoarthritis, unspecified site: Secondary | ICD-10-CM | POA: Diagnosis present

## 2015-10-29 DIAGNOSIS — I480 Paroxysmal atrial fibrillation: Secondary | ICD-10-CM | POA: Diagnosis present

## 2015-10-29 DIAGNOSIS — E1165 Type 2 diabetes mellitus with hyperglycemia: Secondary | ICD-10-CM | POA: Diagnosis present

## 2015-10-29 DIAGNOSIS — D638 Anemia in other chronic diseases classified elsewhere: Secondary | ICD-10-CM | POA: Diagnosis present

## 2015-10-29 DIAGNOSIS — I5033 Acute on chronic diastolic (congestive) heart failure: Secondary | ICD-10-CM | POA: Diagnosis not present

## 2015-10-29 DIAGNOSIS — J449 Chronic obstructive pulmonary disease, unspecified: Secondary | ICD-10-CM | POA: Diagnosis present

## 2015-10-29 DIAGNOSIS — IMO0002 Reserved for concepts with insufficient information to code with codable children: Secondary | ICD-10-CM | POA: Diagnosis present

## 2015-10-29 DIAGNOSIS — K5900 Constipation, unspecified: Secondary | ICD-10-CM | POA: Diagnosis present

## 2015-10-29 DIAGNOSIS — R05 Cough: Secondary | ICD-10-CM

## 2015-10-29 DIAGNOSIS — D649 Anemia, unspecified: Secondary | ICD-10-CM

## 2015-10-29 DIAGNOSIS — E118 Type 2 diabetes mellitus with unspecified complications: Secondary | ICD-10-CM

## 2015-10-29 DIAGNOSIS — I481 Persistent atrial fibrillation: Secondary | ICD-10-CM | POA: Diagnosis not present

## 2015-10-29 DIAGNOSIS — J811 Chronic pulmonary edema: Secondary | ICD-10-CM

## 2015-10-29 DIAGNOSIS — I10 Essential (primary) hypertension: Secondary | ICD-10-CM | POA: Diagnosis present

## 2015-10-29 DIAGNOSIS — R791 Abnormal coagulation profile: Secondary | ICD-10-CM

## 2015-10-29 HISTORY — DX: Heart failure, unspecified: I50.9

## 2015-10-29 HISTORY — DX: Cardiac arrhythmia, unspecified: I49.9

## 2015-10-29 LAB — CBC WITH DIFFERENTIAL/PLATELET
BASOS ABS: 0 10*3/uL (ref 0.0–0.1)
BASOS PCT: 0 %
EOS ABS: 0.1 10*3/uL (ref 0.0–0.7)
Eosinophils Relative: 2 %
HCT: 33.4 % — ABNORMAL LOW (ref 36.0–46.0)
Hemoglobin: 11.1 g/dL — ABNORMAL LOW (ref 12.0–15.0)
Lymphocytes Relative: 10 %
Lymphs Abs: 0.7 10*3/uL (ref 0.7–4.0)
MCH: 28 pg (ref 26.0–34.0)
MCHC: 33.2 g/dL (ref 30.0–36.0)
MCV: 84.3 fL (ref 78.0–100.0)
MONO ABS: 0.5 10*3/uL (ref 0.1–1.0)
MONOS PCT: 7 %
NEUTROS ABS: 6 10*3/uL (ref 1.7–7.7)
NEUTROS PCT: 81 %
Platelets: 196 10*3/uL (ref 150–400)
RBC: 3.96 MIL/uL (ref 3.87–5.11)
RDW: 13.9 % (ref 11.5–15.5)
WBC: 7.3 10*3/uL (ref 4.0–10.5)

## 2015-10-29 LAB — TROPONIN I
Troponin I: <0.03 ng/mL (ref <0.031)
Troponin I: 0.03 ng/mL (ref <0.031)

## 2015-10-29 LAB — PROTIME-INR
INR: 3.48 — ABNORMAL HIGH (ref 0.00–1.49)
Prothrombin Time: 34.2 seconds — ABNORMAL HIGH (ref 11.6–15.2)

## 2015-10-29 LAB — BASIC METABOLIC PANEL
ANION GAP: 12 (ref 5–15)
BUN: 25 mg/dL — ABNORMAL HIGH (ref 6–20)
CALCIUM: 9.1 mg/dL (ref 8.9–10.3)
CO2: 24 mmol/L (ref 22–32)
CREATININE: 1.91 mg/dL — AB (ref 0.44–1.00)
Chloride: 98 mmol/L — ABNORMAL LOW (ref 101–111)
GFR, EST AFRICAN AMERICAN: 27 mL/min — AB (ref >60)
GFR, EST NON AFRICAN AMERICAN: 23 mL/min — AB (ref >60)
GLUCOSE: 226 mg/dL — AB (ref 65–99)
Potassium: 3.7 mmol/L (ref 3.5–5.1)
Sodium: 134 mmol/L — ABNORMAL LOW (ref 135–145)

## 2015-10-29 LAB — GLUCOSE, CAPILLARY: GLUCOSE-CAPILLARY: 193 mg/dL — AB (ref 65–99)

## 2015-10-29 LAB — I-STAT TROPONIN, ED: TROPONIN I, POC: 0.01 ng/mL (ref 0.00–0.08)

## 2015-10-29 LAB — BRAIN NATRIURETIC PEPTIDE: B NATRIURETIC PEPTIDE 5: 429.7 pg/mL — AB (ref 0.0–100.0)

## 2015-10-29 MED ORDER — LABETALOL HCL 200 MG PO TABS
300.0000 mg | ORAL_TABLET | Freq: Two times a day (BID) | ORAL | Status: DC
  Administered 2015-10-29 – 2015-11-01 (×6): 300 mg via ORAL
  Filled 2015-10-29 (×12): qty 1

## 2015-10-29 MED ORDER — INSULIN ASPART 100 UNIT/ML ~~LOC~~ SOLN: 0.0000 [IU] | Freq: Every day | SUBCUTANEOUS | Status: DC

## 2015-10-29 MED ORDER — METHYLPREDNISOLONE SODIUM SUCC 125 MG IJ SOLR: 125.0000 mg | Freq: Once | INTRAMUSCULAR | Status: DC

## 2015-10-29 MED ORDER — AMIODARONE HCL 200 MG PO TABS
400.0000 mg | ORAL_TABLET | Freq: Two times a day (BID) | ORAL | Status: DC
  Administered 2015-10-29 – 2015-10-30 (×2): 400 mg via ORAL
  Filled 2015-10-29 (×2): qty 2

## 2015-10-29 MED ORDER — HYDRALAZINE HCL 50 MG PO TABS
100.0000 mg | ORAL_TABLET | Freq: Three times a day (TID) | ORAL | Status: DC
  Administered 2015-10-30 – 2015-11-01 (×7): 100 mg via ORAL
  Filled 2015-10-29 (×7): qty 2

## 2015-10-29 MED ORDER — FERROUS SULFATE 325 (65 FE) MG PO TABS
325.0000 mg | ORAL_TABLET | Freq: Two times a day (BID) | ORAL | Status: DC
  Administered 2015-10-30 – 2015-11-01 (×5): 325 mg via ORAL
  Filled 2015-10-29 (×5): qty 1

## 2015-10-29 MED ORDER — CLONIDINE HCL 0.2 MG PO TABS
0.2000 mg | ORAL_TABLET | Freq: Every day | ORAL | Status: DC
  Administered 2015-10-29 – 2015-10-30 (×2): 0.2 mg via ORAL
  Filled 2015-10-29: qty 1
  Filled 2015-10-29: qty 2

## 2015-10-29 MED ORDER — HYDRALAZINE HCL 50 MG PO TABS
100.0000 mg | ORAL_TABLET | Freq: Once | ORAL | Status: AC
  Administered 2015-10-29: 100 mg via ORAL
  Filled 2015-10-29 (×2): qty 2

## 2015-10-29 MED ORDER — POTASSIUM CHLORIDE CRYS ER 20 MEQ PO TBCR
20.0000 meq | EXTENDED_RELEASE_TABLET | Freq: Two times a day (BID) | ORAL | Status: DC
  Administered 2015-10-29 – 2015-11-01 (×6): 20 meq via ORAL
  Filled 2015-10-29 (×6): qty 1

## 2015-10-29 MED ORDER — FUROSEMIDE 10 MG/ML IJ SOLN
40.0000 mg | Freq: Once | INTRAMUSCULAR | Status: AC
  Administered 2015-10-29: 40 mg via INTRAVENOUS
  Filled 2015-10-29: qty 4

## 2015-10-29 MED ORDER — INSULIN ASPART 100 UNIT/ML ~~LOC~~ SOLN
0.0000 [IU] | Freq: Three times a day (TID) | SUBCUTANEOUS | Status: DC
  Administered 2015-10-31: 2 [IU] via SUBCUTANEOUS
  Administered 2015-10-31: 1 [IU] via SUBCUTANEOUS
  Administered 2015-11-01: 3 [IU] via SUBCUTANEOUS

## 2015-10-29 MED ORDER — ALBUTEROL SULFATE (2.5 MG/3ML) 0.083% IN NEBU
5.0000 mg | INHALATION_SOLUTION | Freq: Once | RESPIRATORY_TRACT | Status: AC
  Administered 2015-10-29: 5 mg via RESPIRATORY_TRACT
  Filled 2015-10-29: qty 6

## 2015-10-29 MED ORDER — WARFARIN - PHARMACIST DOSING INPATIENT
Freq: Every day | Status: DC
  Administered 2015-10-30: 18:00:00

## 2015-10-29 MED ORDER — NITROGLYCERIN 0.4 MG SL SUBL
0.4000 mg | SUBLINGUAL_TABLET | Freq: Once | SUBLINGUAL | Status: AC
  Administered 2015-10-29: 0.4 mg via SUBLINGUAL
  Filled 2015-10-29: qty 1

## 2015-10-29 MED ORDER — IPRATROPIUM BROMIDE 0.02 % IN SOLN
0.5000 mg | Freq: Once | RESPIRATORY_TRACT | Status: AC
  Administered 2015-10-29: 0.5 mg via RESPIRATORY_TRACT
  Filled 2015-10-29: qty 2.5

## 2015-10-29 MED ORDER — FUROSEMIDE 10 MG/ML IJ SOLN
40.0000 mg | Freq: Once | INTRAMUSCULAR | Status: AC
  Administered 2015-10-30: 40 mg via INTRAVENOUS
  Filled 2015-10-29: qty 4

## 2015-10-29 MED ORDER — PANTOPRAZOLE SODIUM 40 MG PO TBEC
40.0000 mg | DELAYED_RELEASE_TABLET | Freq: Every day | ORAL | Status: DC
  Administered 2015-10-30 – 2015-11-01 (×3): 40 mg via ORAL
  Filled 2015-10-29 (×3): qty 1

## 2015-10-29 MED ORDER — PRAVASTATIN SODIUM 20 MG PO TABS
20.0000 mg | ORAL_TABLET | Freq: Every day | ORAL | Status: DC
  Administered 2015-10-30 – 2015-10-31 (×2): 20 mg via ORAL
  Filled 2015-10-29 (×2): qty 1

## 2015-10-29 MED ORDER — DILTIAZEM HCL ER COATED BEADS 180 MG PO CP24
180.0000 mg | ORAL_CAPSULE | Freq: Every day | ORAL | Status: DC
  Administered 2015-10-30 – 2015-11-01 (×3): 180 mg via ORAL
  Filled 2015-10-29 (×3): qty 1

## 2015-10-29 MED ORDER — NITROGLYCERIN IN D5W 200-5 MCG/ML-% IV SOLN
60.0000 ug/min | INTRAVENOUS | Status: DC
  Administered 2015-10-29: 60 ug/min via INTRAVENOUS
  Filled 2015-10-29: qty 250

## 2015-10-29 NOTE — Progress Notes (Signed)
Patient is tolerating 4L [place] sat 94%. Patient is in no distress at this time. RR is 19. BIPAP is not needed at this time. Patient states she doesn't need bipap at this time. RT will continue to monitor as needed.

## 2015-10-29 NOTE — ED Provider Notes (Signed)
CSN: [id number]     Arrival date & time 10/29/15  1736 History   First MD Initiated Contact with Patient 10/29/15 1740     Chief Complaint  Patient presents with  . Shortness of Breath  . Chest Pain     (Consider location/radiation/quality/duration/timing/severity/associated sxs/prior Treatment) HPI Comments: Ariel Avila is a 81 y.o. female with a PMHx of HTN, GERD, HLD, chronic bronchitis, DM2, hiatal hernia, arthritis, CKD3, and paroxysmal Afib on coumadin with recent failed cardioversion on 10/24/15, who presents to the ED with complaints of chest pain and shortness of breath. Level V caveat due to respiratory distress, history is somewhat limited. Patient states that she has been short of breath for 2 days, and yesterday while at rest she developed constant 6/10 central CP which she describes as tightness radiating to her back, worse with breathing, and minimally improved with 2 nitroglycerin given en route by EMS. Associated symptoms include wheezing and a dry cough. She also reports some chronic constipation, but is unable to tell me when her last bowel movement was. Additional associated symptoms include fatigue since Wednesday, and diaphoresis today.   She denies any fevers, chills, leg swelling, hemoptysis, recent travel/immobilization, history of DVT/PE, estrogen use, abdominal pain, nausea, vomiting, diarrhea, hematochezia, melena, dysuria, hematuria, numbness, tingling, weakness, or lightheadedness. She has never been a smoker. She is not sure of her family history of cardiac disease. Her cardiologist is Dr. Tamala Julian.  Patient is a 81 y.o. female presenting with chest pain. The history is provided by the patient. No language interpreter was used.  Chest Pain Pain location:  Substernal area Pain quality: tightness   Pain radiates to:  Mid back Pain radiates to the back: yes   Pain severity:  Moderate Onset quality:  Gradual Duration:  1 day Timing:  Constant Progression:   Unchanged Chronicity:  New Context: at rest   Relieved by:  Nitroglycerin Worsened by:  Deep breathing Ineffective treatments:  None tried Associated symptoms: cough (dry), diaphoresis, fatigue and shortness of breath   Associated symptoms: no abdominal pain, no fever, no lower extremity edema, no nausea, no numbness, no syncope, not vomiting and no weakness   Risk factors: diabetes mellitus, high cholesterol, hypertension and surgery   Risk factors: no birth control, no immobilization, no prior DVT/PE and no smoking     Past Medical History  Diagnosis Date  . Hypertension   . Allergy   . GERD (gastroesophageal reflux disease)   . Hyperlipidemia   . Osteoporosis   . Chronic bronchitis ([place])     "lately" (06/23/2014)  . Type II diabetes mellitus ([place])   . H/O hiatal hernia   . Arthritis     "fingers and back" (06/23/2014)  . Chronic kidney disease (CKD), stage III (moderate)   . Basal cell carcinoma   . Paroxysmal a-fib [facility])    Past Surgical History  Procedure Laterality Date  . Abdominal hysterectomy    . Appendectomy    . Basal cell carcinoma excision      "face and nose"  . Cardioversion N/A 10/24/2015    Procedure: CARDIOVERSION;  Surgeon: Pixie Casino, MD;  Location: [facility] ENDOSCOPY;  Service: Cardiovascular;  Laterality: N/A;   Family History  Problem Relation Age of Onset  . Tuberculosis Mother   . Heart disease Father     bad valve  . Heart disease Brother     died at 72  . Heart attack Father   . Heart attack Paternal Grandmother   .  Heart attack Brother   . Hypertension Neg Hx   . Stroke Neg Hx    Social History  Substance Use Topics  . Smoking status: Never Smoker   . Smokeless tobacco: Never Used  . Alcohol Use: No   OB History    No data available     Review of Systems  Unable to perform ROS: Severe respiratory distress  Constitutional: Positive for diaphoresis and fatigue. Negative for fever and chills.  Respiratory: Positive for cough  (dry), shortness of breath and wheezing.   Cardiovascular: Positive for chest pain. Negative for leg swelling and syncope.  Gastrointestinal: Positive for constipation (chronic and ongoing issue). Negative for nausea, vomiting, abdominal pain and diarrhea.  Genitourinary: Negative for dysuria and hematuria.  Allergic/Immunologic: Positive for immunocompromised state (diabetic).  Neurological: Negative for weakness, light-headedness and numbness.     Allergies  Levaquin and Sulfa antibiotics  Home Medications   Prior to Admission medications   Medication Sig Start Date End Date Taking? Authorizing Provider  amiodarone (PACERONE) 400 MG tablet Take 1 tablet (400 mg total) by mouth 2 (two) times daily. 10/25/15   Brittainy Erie Noe, PA-C  cloNIDine (CATAPRES) 0.2 MG tablet Take 0.2 mg by mouth at bedtime.     Historical Provider, MD  desloratadine (CLARINEX) 5 MG tablet Take 5 mg by mouth daily.    Historical Provider, MD  diltiazem (CARDIZEM CD) 180 MG 24 hr capsule Take 180 mg by mouth daily.    Historical Provider, MD  ferrous sulfate 325 (65 FE) MG tablet Take 325 mg by mouth 2 (two) times daily.     Historical Provider, MD  fluticasone (FLONASE) 50 MCG/ACT nasal spray Place 1 spray into both nostrils daily as needed for allergies or rhinitis.     Historical Provider, MD  furosemide (LASIX) 20 MG tablet Take 2 tablets (40 mg total) by mouth daily. 06/24/14   Bobby Rumpf [place], PA-C  glimepiride (AMARYL) 1 MG tablet Take 1 tablet (1 mg total) by mouth daily with breakfast. 12/06/14   Barton Dubois, MD  hydrALAZINE (APRESOLINE) 50 MG tablet Take 100 mg by mouth 3 (three) times daily.     Historical Provider, MD  labetalol (NORMODYNE) 300 MG tablet Take 300 mg by mouth 2 (two) times daily.    Historical Provider, MD  lovastatin (MEVACOR) 40 MG tablet Take 1 tablet (40 mg total) by mouth at bedtime. 01/05/14   Belva Crome, MD  Multiple Vitamin (MULTIVITAMIN WITH MINERALS) TABS tablet Take 1  tablet by mouth daily. One a Day Women's    Historical Provider, MD  Multiple Vitamins-Minerals (HAIR/SKIN/NAILS/BIOTIN) TABS Take 1 tablet by mouth daily.    Historical Provider, MD  pantoprazole (PROTONIX) 40 MG tablet Take 40 mg by mouth daily.    Historical Provider, MD  Polyvinyl Alcohol-Povidone (REFRESH OP) Place 1 drop into both eyes 2 (two) times daily as needed (for dry eyes).    Historical Provider, MD  vitamin A 8000 UNIT capsule Take 8,000 Units by mouth daily.    Historical Provider, MD  warfarin (COUMADIN) 2 MG tablet TAKE AS DIRECTED BY  COUMADIN  CLINIC Patient taking differently: TAKE 2 1/2 TABLETS BY MOUTH DAILY BEFORE SUPPER OR AS DIRECTED BY  COUMADIN  CLINIC 10/05/15   Belva Crome, MD   BP 195/85 mmHg  Pulse 77  Temp(Src) 98.1 F (36.7 C) (Oral)  Resp 25  Ht 5\' 4"  (1.626 m)  Wt 153 lb (69.4 kg)  BMI 26.25 kg/m2  SpO2  96% Physical Exam  Constitutional: She is oriented to person, place, and time. She appears well-developed and well-nourished.  Non-toxic appearance. She appears distressed.  Afebrile, nontoxic, in respiratory distress. Hypertensive in the 190s/90s  HENT:  Head: Normocephalic and atraumatic.  Mouth/Throat: Oropharynx is clear and moist and mucous membranes are normal.  Eyes: Conjunctivae and EOM are normal. Right eye exhibits no discharge. Left eye exhibits no discharge.  Neck: Normal range of motion. Neck supple.  Cardiovascular: Normal rate, regular rhythm, normal heart sounds and intact distal pulses.  Exam reveals no gallop and no friction rub.   No murmur heard. RRR, nl s1/s2, no m/r/g, distal pulses intact, trace b/l pedal edema   Pulmonary/Chest: Accessory muscle usage present. Tachypnea noted. She is in respiratory distress. She has decreased breath sounds. She has wheezes. She has no rhonchi. She has no rales. She exhibits tenderness and retraction. She exhibits no crepitus and no deformity.    Respiratory distress, increased WOB,  retractions, abdominal respirations noted, tachypneic, unable to speak more than 1-2 words at a time, appears very SOB. Poor air movement bilaterally, with wheezing noted diffusely throughout. No focal consolidation/rhonchi/rales noted. SpO2 96% on 4L via [place]. Mild chest wall TTP anteriorly, no crepitus or deformities  Abdominal: Soft. Normal appearance and bowel sounds are normal. She exhibits no distension. There is no tenderness. There is no rigidity, no rebound, no guarding, no CVA tenderness, no tenderness at McBurney's point and negative Murphy's sign.  Musculoskeletal: Normal range of motion.  MAE x4 Strength and sensation grossly intact Distal pulses intact Trace b/l pedal edema, neg homan's bilaterally   Neurological: She is alert and oriented to person, place, and time. She has normal strength. No sensory deficit.  Skin: Skin is warm, dry and intact. No rash noted.  Psychiatric: She has a normal mood and affect.  Nursing note and vitals reviewed.   ED Course  Procedures (including critical care time)  CRITICAL CARE- respiratory distress Performed by: Corine Shelter   Total critical care time: 30 minutes  Critical care time was exclusive of separately billable procedures and treating other patients.  Critical care was necessary to treat or prevent imminent or life-threatening deterioration.  Critical care was time spent personally by me on the following activities: development of treatment plan with patient and/or surrogate as well as nursing, discussions with consultants, evaluation of patient's response to treatment, examination of patient, obtaining history from patient or surrogate, ordering and performing treatments and interventions, ordering and review of laboratory studies, ordering and review of radiographic studies, pulse oximetry and re-evaluation of patient's condition.   Labs Review Labs Reviewed  BASIC METABOLIC PANEL - Abnormal; Notable for the  following:    Sodium 134 (*)    Chloride 98 (*)    Glucose, Bld 226 (*)    BUN 25 (*)    Creatinine, Ser 1.91 (*)    GFR calc non Af Amer 23 (*)    GFR calc Af Amer 27 (*)    All other components within normal limits  CBC WITH DIFFERENTIAL/PLATELET - Abnormal; Notable for the following:    Hemoglobin 11.1 (*)    HCT 33.4 (*)    All other components within normal limits  BRAIN NATRIURETIC PEPTIDE - Abnormal; Notable for the following:    B Natriuretic Peptide 429.7 (*)    All other components within normal limits  PROTIME-INR - Abnormal; Notable for the following:    Prothrombin Time 34.2 (*)    INR 3.48 (*)  All other components within normal limits  TROPONIN I  I-STAT TROPOININ, ED    Imaging Review Dg Chest Portable 1 View  10/29/2015  CLINICAL DATA:  81 year old female with a history of shortness of breath and chest pain. EXAM: PORTABLE CHEST 1 VIEW COMPARISON:  06/23/2014 FINDINGS: Cardiomediastinal silhouette unchanged, with cardiomegaly. Atherosclerosis. Fullness in the central vasculature with indistinct margins. Interstitial opacities with interlobular septal thickening. Blunting of the bilateral costophrenic angles. No pneumothorax. IMPRESSION: Evidenced of developing CHF, with interstitial edema and likely small pleural effusions and atelectasis. Atherosclerosis. Signed, Dulcy Fanny. Earleen Newport, DO [facility] Radiology Electronically Signed   By: Corrie Mckusick D.O.   On: 10/29/2015 18:26     Echo 06/24/14: Study Conclusions - Left ventricle: The cavity size was normal. There was mild concentric hypertrophy. Systolic function was normal. The estimated ejection fraction was in the range of 55% to 60%. Wall motion was normal; there were no regional wall motion abnormalities. - Mitral valve: There was mild regurgitation. - Left atrium: The atrium was mildly dilated. - Tricuspid valve: There was trivial regurgitation. -  Pulmonic valve: There was trivial regurgitation.  I have personally reviewed and evaluated these images and lab results as part of my medical decision-making.   EKG Interpretation   Date/Time:  Saturday October 29 2015 17:53:45 EDT Ventricular Rate:  79 PR Interval:  188 QRS Duration: 96 QT Interval:  455 QTC Calculation: 522 R Axis:   42 Text Interpretation:  Sinus rhythm Probable anterior infarct, old Minimal  ST depression, inferior leads Prolonged QT interval No significant change  was found Confirmed by CAMPOS  MD, Lennette Bihari ([id number]) on 10/29/2015 6:08:14 PM      MDM   Final diagnoses:  SOB (shortness of breath)  Chest tightness  Acute on chronic congestive heart failure, unspecified congestive heart failure type (HCC)  Wheezing  Cough  Constipation, unspecified constipation type  Paroxysmal a-fib (HCC)  Essential hypertension  Chronic anemia  Supratherapeutic INR  CKD (chronic kidney disease), unspecified stage    81 y.o. female here with CP, SOB x2 days, recent failed cardioversion for paroxysmal afib. Recently increased her amiodarone and cardizem. On exam, no tachycardia but pt with extremely increased WOB, retractions, unable to speak more than 1-2 words at a time. Hypoxic requiring 4L via [place] in order to maintain saturations in the 90s. Wheezing noted, poor air movement bilaterally. Mild tenderness to anterior chest wall. 2NTG given with minimal relief. Pt on coumadin. Will get labs, portable CXR, EKG, and give nebs/solumedrol. Pt discussed with Dr. Venora Maples, attending, who will see pt now. Possibly may need bipap due to amount of increased WOB. Will reassess shortly.   6:29 PM EKG without acute changes. CXR showing developing CHF with interstitial edema and b/l pleural effusions. Will give lasix. BP also elevated still, will give NTG SL and NTG infusion. Discontinued solumedrol.  6:43 PM Pt tolerating bipap well, feels less SOB and less chest tightness, currently getting  breathing treatment through bipap. Slightly less WOB, appears less distressed. Will monitor closely. CBC w/diff showing baseline anemia but otherwise unremarkable. Awaiting remaining labs. Will reassess shortly.  7:37 PM Wheezing improved after breathing treatment, could be some component of bronchospasm in addition to CHF. BP improved with NTG, now down to 157/71. Breathing much easier with bipap. CP improved. Trop neg, BMP showing baseline BUN/Cr elevations, and slightly elevated glucose without anion gap.  INR 3.48, supratherapeutic, pt took this prior to arrival. BNP still pending but that  won't change admission, therefore will proceed with admission.   7:48 PM BNP resulting at 429.7. Awaiting return call from admitting team.  8:00 PM Dr. Loleta Books returning page, will admit to step down, requested that we give pt her home dose of hydralazine now. He will place admit orders. Please see his notes for further documentation of care  BP 151/66 mmHg  Pulse 76  Temp(Src) 98.1 F (36.7 C) (Oral)  Resp 23  Ht 5\' 4"  (1.626 m)  Wt 153 lb (69.4 kg)  BMI 26.25 kg/m2  SpO2 96%  Medications  nitroGLYCERIN 50 mg in dextrose 5 % 250 mL (0.2 mg/mL) infusion (60 mcg/min Intravenous New Bag/Given 10/29/15 1915)  hydrALAZINE (APRESOLINE) tablet 100 mg (not administered)  albuterol (PROVENTIL) (2.5 MG/3ML) 0.083% nebulizer solution 5 mg (5 mg Nebulization Given 10/29/15 1834)  ipratropium (ATROVENT) nebulizer solution 0.5 mg (0.5 mg Nebulization Given 10/29/15 1834)  furosemide (LASIX) injection 40 mg (40 mg Intravenous Given 10/29/15 1907)  nitroGLYCERIN (NITROSTAT) SL tablet 0.4 mg (0.4 mg Sublingual Given 10/29/15 1906)     Cloe Sockwell Camprubi-Soms, PA-C 10/29/15 [place], MD 10/29/15 2009

## 2015-10-29 NOTE — ED Notes (Signed)
[facility] picked pt up at her home with c/o SOB and central chest pain tighness and pressure.  Pt has be shocked 3 times in 2 weeks to convert her AFIB but unsuccessful.  New meds started: amnio and cardizem this week.  Pt SAT 97% on 4 l, 204/96, 80, 20 L AC, given 2 nitro with chest relief 6 to 4.  Pt states no energy since Wednesday.

## 2015-10-29 NOTE — Progress Notes (Signed)
ANTICOAGULATION CONSULT NOTE - Initial Consult  Pharmacy Consult for warfarin Indication: atrial fibrillation  Allergies  Allergen Reactions  . Levaquin [Levofloxacin] Hives, Itching and Other (See Comments)    insomnia  . Sulfa Antibiotics Rash    Patient Measurements: Height: 5\' 4"  (162.6 cm) Weight: 153 lb (69.4 kg) IBW/kg (Calculated) : 54.7   Vital Signs: Temp: 98.7 F (37.1 C) (10/29 2100) Temp Source: Oral (10/29 2100) BP: 161/69 mmHg (10/29 2100) Pulse Rate: 76 (10/29 2100)  Labs:  Recent Labs  10/29/15 1806 10/29/15 1820  HGB 11.1*  --   HCT 33.4*  --   PLT 196  --   LABPROT 34.2*  --   INR 3.48*  --   CREATININE 1.91*  --   TROPONINI  --  <0.03    Estimated Creatinine Clearance: 21.4 mL/min (by C-G formula based on Cr of 1.91).   Medical History: Past Medical History  Diagnosis Date  . Hypertension   . Allergy   . GERD (gastroesophageal reflux disease)   . Hyperlipidemia   . Osteoporosis   . Chronic bronchitis ([place])     "lately" (06/23/2014)  . Type II diabetes mellitus ([place])   . H/O hiatal hernia   . Arthritis     "fingers and back" (06/23/2014)  . Chronic kidney disease (CKD), stage III (moderate)   . Basal cell carcinoma   . Paroxysmal a-fib ([place])   . Dysrhythmia   . CHF (congestive heart failure) [facility])    Assessment: 81 y.o. female with a past medical history significant for NIDDM, HTN, COPD, and persistent A. fib on warfarin who failed cardioversion on Monday and started on Tuesday on amiodarone who presents with worsening shortness of breath over the last week.  Patient on coumadin pta, INR was 3.48 this evening. No overt bleeding noted, Hgb close to normal limit at 11.1. Will hold dose tonight and recheck INR in am. Redose when back within therapeutic range.   Patient was also high at last clinic visit at 3.8 on 10/25. Sent home on 5mg  daily except 4mg  on Tuesdays and Saturdays. Noted she is a new start to [facility].  Goal of Therapy:   INR 2-3 Monitor platelets by anticoagulation protocol: Yes   Plan:  Hold warfarin tonight Daily INR  Erin Hearing PharmD., BCPS Clinical Pharmacist Pager [phone] 10/29/2015 10:21 PM

## 2015-10-29 NOTE — H&P (Signed)
History and Physical  Patient Name: Ariel Avila     EHU:[id number]    DOB: 29-Feb-1930    DOA: 10/29/2015 Referring physician: Jola Schmidt, MD PCP: Anthoney Harada, MD      Chief Complaint: Shortness of breath  HPI: Ariel Avila is a 81 y.o. female with a past medical history significant for NIDDM, HTN, COPD, and persistent A. fib on warfarin who failed cardioversion on Monday and started on Tuesday on amiodarone who presents with worsening shortness of breath over the last week.  The patient has had long-standing paroxysmal atrial fibrillation recently found to be persistently in A. fib with rapid rate. She underwent failed DC cardioversion by Dr. Debara Pickett on Monday of this week and was started on Tuesday on oral amiodarone loading.  She and her daughter now report that although she has been dyspneic since her persistent rapid A. fib started 2 weeks ago, in the last 5 days this has gotten progressively and persistently worse to the point that tonight she felt severely short of breath, diaphoretic, and with chest heaviness.  She has not had increased leg swelling or weight gain.  In the ED, the patient was hypertensive, tachypneic, and had elevated BNP. Initial troponin was negative. Chest x-ray showed pulmonary edema and the patient was given furosemide 40 mg IV and put on BiPAP with improvement. A nitroglycerin drip was started for hypertensive urgency.       Review of Systems:  Pt complains of SOB, fatigue, cough, chest heaviness. She endorses orthopnea, and paroxysmal nocturnal dyspnea.  Pt denies any fever, sputum.  She denies wheezing.  She denies leg swelling or weight gain.  All other systems negative except as just noted or noted in the history of present illness.  Allergies  Allergen Reactions  . Levaquin [Levofloxacin] Hives, Itching and Other (See Comments)    insomnia  . Sulfa Antibiotics Rash    Prior to Admission medications   Medication Sig Start Date End Date  Taking? Authorizing Provider  amiodarone (PACERONE) 400 MG tablet Take 1 tablet (400 mg total) by mouth 2 (two) times daily. 10/25/15   Brittainy Erie Noe, PA-C  cloNIDine (CATAPRES) 0.2 MG tablet Take 0.2 mg by mouth at bedtime.     Historical Provider, MD  desloratadine (CLARINEX) 5 MG tablet Take 5 mg by mouth daily.    Historical Provider, MD  diltiazem (CARDIZEM CD) 180 MG 24 hr capsule Take 180 mg by mouth daily.    Historical Provider, MD  ferrous sulfate 325 (65 FE) MG tablet Take 325 mg by mouth 2 (two) times daily.     Historical Provider, MD  fluticasone (FLONASE) 50 MCG/ACT nasal spray Place 1 spray into both nostrils daily as needed for allergies or rhinitis.     Historical Provider, MD  furosemide (LASIX) 20 MG tablet Take 2 tablets (40 mg total) by mouth daily. 06/24/14   Bobby Rumpf [place], PA-C  glimepiride (AMARYL) 1 MG tablet Take 1 tablet (1 mg total) by mouth daily with breakfast. 12/06/14   Barton Dubois, MD  hydrALAZINE (APRESOLINE) 50 MG tablet Take 100 mg by mouth 3 (three) times daily.     Historical Provider, MD  labetalol (NORMODYNE) 300 MG tablet Take 300 mg by mouth 2 (two) times daily.    Historical Provider, MD  lovastatin (MEVACOR) 40 MG tablet Take 1 tablet (40 mg total) by mouth at bedtime. 01/05/14   Belva Crome, MD  Multiple Vitamin (MULTIVITAMIN WITH MINERALS) TABS tablet Take 1 tablet  by mouth daily. One a Day Women's    Historical Provider, MD  Multiple Vitamins-Minerals (HAIR/SKIN/NAILS/BIOTIN) TABS Take 1 tablet by mouth daily.    Historical Provider, MD  pantoprazole (PROTONIX) 40 MG tablet Take 40 mg by mouth daily.    Historical Provider, MD  Polyvinyl Alcohol-Povidone (REFRESH OP) Place 1 drop into both eyes 2 (two) times daily as needed (for dry eyes).    Historical Provider, MD  vitamin A 8000 UNIT capsule Take 8,000 Units by mouth daily.    Historical Provider, MD  warfarin (COUMADIN) 2 MG tablet TAKE AS DIRECTED BY  COUMADIN  CLINIC Patient taking  differently: TAKE 2 1/2 TABLETS BY MOUTH DAILY BEFORE SUPPER OR AS DIRECTED BY  COUMADIN  CLINIC 10/05/15   Belva Crome, MD    Past Medical History  Diagnosis Date  . Hypertension   . Allergy   . GERD (gastroesophageal reflux disease)   . Hyperlipidemia   . Osteoporosis   . Chronic bronchitis ([place])     "lately" (06/23/2014)  . Type II diabetes mellitus ([place])   . H/O hiatal hernia   . Arthritis     "fingers and back" (06/23/2014)  . Chronic kidney disease (CKD), stage III (moderate)   . Basal cell carcinoma   . Paroxysmal a-fib [facility])     Past Surgical History  Procedure Laterality Date  . Abdominal hysterectomy    . Appendectomy    . Basal cell carcinoma excision      "face and nose"  . Cardioversion N/A 10/24/2015    Procedure: CARDIOVERSION;  Surgeon: Pixie Casino, MD;  Location: [facility] ENDOSCOPY;  Service: Cardiovascular;  Laterality: N/A;    Family history: family history includes Heart attack in her brother, father, and paternal grandmother; Heart disease in her brother and father; Tuberculosis in her mother. There is no history of Hypertension or Stroke.  Social History: Patient lives with her husband who has dementia (this is a life stressor, as the patient is trying to arrange for nursing home care for her husband). She is a never smoker. She does not drink. She is independent with all IADLs and ADLs and drives.       Physical Exam: BP 158/68 mmHg  Pulse 77  Temp(Src) 98.1 F (36.7 C) (Oral)  Resp 20  Ht 5\' 4"  (1.626 m)  Wt 69.4 kg (153 lb)  BMI 26.25 kg/m2  SpO2 97% General appearance: Elderly well-developed adult female, alert and in moderate distress from dyspnea, but more relaxed with BiPAP.   Eyes: Anicteric, conjunctiva pink, lids and lashes normal.     ENT: No nasal deformity, discharge, or epistaxis.  OP moist without lesions.   Skin: Warm and dry.  No jaundice.  No suspicious rashes or lesions. Cardiac: RRR, nl S1-S2, loud second heart sound, no  murmurs appreciated.  Capillary refill is brisk.  EJ's distended with expiration.  No LE edema.  Radial and DP pulses 2+ and symmetric.   Respiratory: On BiPAP. Crackles bilaterally to midback. Abdomen: Abdomen soft without rigidity.  No TTP. No ascites, distension.   MSK: No deformities or effusions. Neuro: Sensorium intact and responding to questions, attention normal.  Speech is fluent.  Moves all extremities equally and with normal coordination.    Psych: Behavior appropriate.  Affect normal.  No evidence of aural or visual hallucinations or delusions.       Labs on Admission:  The metabolic panel shows mild hyponatremia, normal potassium, normal bicarbonate, serum creatinine 1.91 mg/dL at baseline.  Mild hyperglycemia. The INR slightly therapeutic at 3.48 The BNP is elevated at 420 pg/mL. The troponin is negative. The complete blood count shows mild chronic normocytic anemia without leukocytosis or thrombocytopenia.   Radiological Exams on Admission: Personally reviewed: Dg Chest Portable 1 View 10/29/2015   Pulmonary edema, question small effusions.    EKG: Independently reviewed. Sinus rhythm, normal rate.  No ST changes.    Assessment/Plan Active Problems:   * No active hospital problems. *  1. Acute on chronic diastolic CHF:  This is new.  Last EF 55% in June 2016.   Now presents after cardioversion and initiating amiodarone with respiratory distress, pulmonary edema on CXR and elevated BNP, improved with Bipap.  Initial troponin normal.  ECG in sinus, normal rate on presentation. Would expect acute amiodarone pulmonary toxicity to cause more hypoxia, no BNP elevation or signs of fluid overload, but this is in differential if patient doesn't improve with diuresis. -Admit to step down on Bipap. -Furosemide 40 mg IV once now and again tomorrow morning -Strict I/Os and goal net negative 1-1.5L per day -Consult to Cardiology given proximity to cardioversion and initiating  amiodarone, appreciate recommendations -Trend troponin  2. Atrial fibrillation on warfarin, amio, dilt:  Currently in sinus since Monday.  ChADs2Vasc 6. -Continue amiodarone and warfarin and diltiazem  3. HTN urgency:  Nitro drip off in ED. -Restart home hydralazine, clonidine, labetalol, diltiazem and add IV furosemide  4. Anemia of chronic disease and iron def.:  Stable. -Continue home iron. -Trend CBC  5. NIDDM:  Stable.  Last HgbA1c in 2015 5.8%. -Hold home sulfonylurea -Correction insulin with meals if needed  6. COPD:  Stable.  -Continue home fluticasone and desloratadine  7. GERD:  Stable.  -Continue home PPI     DVT PPx: Warfarin Diet: Cardiac Consultants: Cardiology Code Status: Full Family Communication: The patient's differential diagnosis, workup, and treatment plan were discussed with her daughter who is on rapid response nurse at [facility] at the bedside. All questions were answered. CODE STATUS was clarified.  Medical decision making: What exists of the patient's previous chart was reviewed in depth and the case was discussed with Cardiology MD on-call and EDP. Patient seen 8:28 PM on 10/29/2015.  Disposition Plan:  Admit for diuresis and BiPAP to stepdown. Trend troponins. Consult cardiology tomorrow. If breathing improves with diuresis anticipate discharge within 1-2 days.      Edwin Dada [facility] Pager [phone]

## 2015-10-29 NOTE — ED Notes (Signed)
Pt placed in a gown and hooked up to the monitor with a 5 lead, BP cuff and pulse ox 

## 2015-10-30 ENCOUNTER — Encounter (HOSPITAL_COMMUNITY): Payer: Self-pay

## 2015-10-30 ENCOUNTER — Inpatient Hospital Stay (HOSPITAL_COMMUNITY): Payer: Medicare Other

## 2015-10-30 DIAGNOSIS — I5033 Acute on chronic diastolic (congestive) heart failure: Secondary | ICD-10-CM

## 2015-10-30 DIAGNOSIS — I481 Persistent atrial fibrillation: Secondary | ICD-10-CM

## 2015-10-30 LAB — CBC
HEMATOCRIT: 30.8 % — AB (ref 36.0–46.0)
Hemoglobin: 10.1 g/dL — ABNORMAL LOW (ref 12.0–15.0)
MCH: 27.7 pg (ref 26.0–34.0)
MCHC: 32.8 g/dL (ref 30.0–36.0)
MCV: 84.4 fL (ref 78.0–100.0)
Platelets: 193 10*3/uL (ref 150–400)
RBC: 3.65 MIL/uL — ABNORMAL LOW (ref 3.87–5.11)
RDW: 14.1 % (ref 11.5–15.5)
WBC: 5.2 10*3/uL (ref 4.0–10.5)

## 2015-10-30 LAB — GLUCOSE, CAPILLARY
GLUCOSE-CAPILLARY: 166 mg/dL — AB (ref 65–99)
GLUCOSE-CAPILLARY: 177 mg/dL — AB (ref 65–99)
Glucose-Capillary: 170 mg/dL — ABNORMAL HIGH (ref 65–99)

## 2015-10-30 LAB — TROPONIN I

## 2015-10-30 LAB — MRSA PCR SCREENING: MRSA BY PCR: NEGATIVE

## 2015-10-30 LAB — BASIC METABOLIC PANEL
ANION GAP: 11 (ref 5–15)
BUN: 24 mg/dL — ABNORMAL HIGH (ref 6–20)
CALCIUM: 9 mg/dL (ref 8.9–10.3)
CO2: 25 mmol/L (ref 22–32)
CREATININE: 1.91 mg/dL — AB (ref 0.44–1.00)
Chloride: 103 mmol/L (ref 101–111)
GFR, EST AFRICAN AMERICAN: 27 mL/min — AB (ref >60)
GFR, EST NON AFRICAN AMERICAN: 23 mL/min — AB (ref >60)
Glucose, Bld: 179 mg/dL — ABNORMAL HIGH (ref 65–99)
Potassium: 3.8 mmol/L (ref 3.5–5.1)
SODIUM: 139 mmol/L (ref 135–145)

## 2015-10-30 LAB — PROTIME-INR
INR: 3.51 — ABNORMAL HIGH (ref 0.00–1.49)
Prothrombin Time: 34.4 seconds — ABNORMAL HIGH (ref 11.6–15.2)

## 2015-10-30 MED ORDER — AMIODARONE HCL 200 MG PO TABS
400.0000 mg | ORAL_TABLET | Freq: Every day | ORAL | Status: DC
  Administered 2015-10-31 – 2015-11-01 (×2): 400 mg via ORAL
  Filled 2015-10-30 (×2): qty 2

## 2015-10-30 MED ORDER — FUROSEMIDE 10 MG/ML IJ SOLN: 40.0000 mg | Freq: Once | INTRAMUSCULAR | Status: DC

## 2015-10-30 MED ORDER — IPRATROPIUM-ALBUTEROL 0.5-2.5 (3) MG/3ML IN SOLN
RESPIRATORY_TRACT | Status: AC
  Filled 2015-10-30: qty 3

## 2015-10-30 MED ORDER — FUROSEMIDE 10 MG/ML IJ SOLN
40.0000 mg | Freq: Once | INTRAMUSCULAR | Status: AC
  Administered 2015-10-30: 40 mg via INTRAVENOUS
  Filled 2015-10-30: qty 4

## 2015-10-30 MED ORDER — PROMETHAZINE HCL 25 MG/ML IJ SOLN: 6.2500 mg | INTRAMUSCULAR | Status: DC | PRN

## 2015-10-30 MED ORDER — FUROSEMIDE 10 MG/ML IJ SOLN
40.0000 mg | Freq: Once | INTRAMUSCULAR | Status: AC
  Administered 2015-10-31: 40 mg via INTRAVENOUS
  Filled 2015-10-30: qty 4

## 2015-10-30 NOTE — Progress Notes (Signed)
Utilization review completed.  

## 2015-10-30 NOTE — Progress Notes (Signed)
Patient is tolerating 4L [place] sat 93%. Patient is in no distress at this time. RR is 20. BIPAP is not needed at this time. Patient states she doesn't need bipap at this time. RT will continue to monitor as needed.

## 2015-10-30 NOTE — Progress Notes (Signed)
RT called to room due to pt having crackles and shortness of breath. RN advised to call MD for further orders. Pt had been moving to the bathroom when this exacerbation occurred. When RT arrived pt feeling better. Crackles heard in lower bases. Upper airway wheezing heard by family member. Pt family assuming it was from fluid. This had resolved by the time I arrived. RT will continue to monitor.

## 2015-10-30 NOTE — Progress Notes (Signed)
[facility] PROGRESS NOTE  Ariel Avila QMG:[id number] DOB: 01-21-1930 DOA: 10/29/2015 PCP: Anthoney Harada, MD  Length of stay: 1   Assessment/Plan: Active Problems:   Atrial fibrillation ([place])   Essential hypertension   Hyperlipidemia   Diabetes mellitus type 2, uncontrolled, with complications ([place])   Hyperaldosteronism (HCC)   Chronic diastolic heart failure (HCC)   CKD (chronic kidney disease) stage 3, GFR 30-59 ml/min   Acute on chronic diastolic heart failure ([place])    1. Acute on chronic diastolic CHF:  This is new. Last EF 55% in June 2016. Now presents after cardioversion and initiating amiodarone with respiratory distress, pulmonary edema on CXR and elevated BNP, improved with Bipap. Initial troponin normal. ECG in sinus, normal rate on presentation.    fluid overload,  in differential . We'll recheck a chest x-ray this morning. Will also obtain an echocardiogram as this was planned  . She'll need an additional dose of diuretics today and most likely can be put back on oral diuretics and discharged tomorrow, appreciate cardiology input . -repeat Furosemide 40 mg IV once now    troponin negative   2. Atrial fibrillation on warfarin, amio, dilt:  Currently in sinus since Monday. ChADs2Vasc 6. -Continue amiodarone and warfarin and diltiazem Recheck INR in am   3. HTN urgency:  Nitro drip off in ED. -Restart home hydralazine, clonidine, labetalol, diltiazem and add IV furosemide  4. Anemia of chronic disease and iron def.:  Stable. -Continue home iron. -Trend CBC  5. NIDDM:  Stable. Last HgbA1c in 2015 5.8%. -Hold home sulfonylurea -Correction insulin with meals if needed  6. COPD:  Stable.  -Continue home fluticasone and desloratadine  7. GERD:  Stable.  -Continue home PPI   DVT prophylaxsis  coumadin  Code Status:      Code Status Orders        Start     Ordered   10/29/15 2148  Full code   Continuous      10/29/15 2147    Advance Directive Documentation        Most Recent Value   Type of Advance Directive  Healthcare Power of Attorney   Pre-existing out of facility DNR order (yellow form or pink MOST form)     "MOST" Form in Place?       Family Communication: family updated about patient's clinical progress Disposition Plan:  tx to tele     Brief narrative: y.o. female with a past medical history significant for recurrent shortness of breath, chest discomfort and palpitations. She has a history of paroxysmal atrial fibrillation, chronic diastolic heart failure, and chronic kidney disease and has predominantly been in sinus rhythm until recently when she was noted to go into A. fib with RVR. She had been therapeutically anticoagulated on warfarin. She was seen by Dr. Burt Knack and started on calcium channel blocker and cardioversion was arranged with me last week. On arrival to the endoscopy suite she was noted to be in A. fib with a fast ventricular response. She underwent unsuccessful cardioversion after 3 shocks. I elected to increase her Cardizem to 240 mg daily for better rate control. She was subsequently seen back in the office by Ellen Henri, PA-C / Dr. Pernell Dupre. I discussed medical options of rate versus rhythm control and proceeded to load the patient on amiodarone on 10/25/2015. Over the past week, Ariel Avila has developed progressive worsening of shortness of breath and presented in extremis with shortness of breath,  diaphoresis and chest heaviness. In the ER she was hypertensive, tachypneic with an elevated BNP of 429 and chest x-ray findings showing pulmonary edema. She was given IV Lasix and placed on BiPAP. IV nitroglycerin was started for hypertensive urgency. Troponin is negative 3 overnight. INR is therapeutic at 3.51. Cardiology is asked to consult regarding atrial fibrillation and shortness of breath.  Consultants:  cardiology  Procedures:  None    Antibiotics: Anti-infectives    None         HPI/Subjective: Back in NSR on amio, SOB improved   Objective: Filed Vitals:   10/30/15 0313 10/30/15 0407 10/30/15 0836 10/30/15 1211  BP: 133/65  166/68 137/67  Pulse: 66  60 62  Temp: 98.4 F (36.9 C)  97.7 F (36.5 C) 97.3 F (36.3 C)  TempSrc: Oral  Oral Oral  Resp: 19  18 20   Height:      Weight:  68.3 kg (150 lb 9.2 oz)    SpO2: 93%  97% 95%    Intake/Output Summary (Last 24 hours) at 10/30/15 1233 Last data filed at 10/29/15 2200  Gross per 24 hour  Intake      0 ml  Output      0 ml  Net      0 ml    Exam:  General: No acute respiratory distress Lungs: Clear to auscultation bilaterally without wheezes or crackles Cardiovascular: Regular rate and rhythm without murmur gallop or rub normal S1 and S2 Abdomen: Nontender, nondistended, soft, bowel sounds positive, no rebound, no ascites, no appreciable mass Extremities: No significant cyanosis, clubbing, or edema bilateral lower extremities     Data Review   Micro Results Recent Results (from the past 240 hour(s))  MRSA PCR Screening     Status: None   Collection Time: 10/29/15  9:51 PM  Result Value Ref Range Status   MRSA by PCR NEGATIVE NEGATIVE Final    Comment:        The GeneXpert MRSA Assay (FDA approved for NASAL specimens only), is one component of a comprehensive MRSA colonization surveillance program. It is not intended to diagnose MRSA infection nor to guide or monitor treatment for MRSA infections.     Radiology Reports Dg Chest Port 1 View  10/30/2015  CLINICAL DATA:  Evaluate for pulmonary edema EXAM: PORTABLE CHEST 1 VIEW COMPARISON:  10/29/2015 FINDINGS: The heart size appears mildly enlarged. There is aortic atherosclerosis. No airspace consolidation identified. Bilateral pleural effusions are identified. There has been mild improvement in interstitial edema. Persistent bibasilar atelectasis. IMPRESSION: 1. Mild CHF with  slight improvement in interstitial edema pattern. Electronically Signed   By: Kerby Moors M.D.   On: 10/30/2015 11:59   Dg Chest Portable 1 View  10/29/2015  CLINICAL DATA:  81 year old female with a history of shortness of breath and chest pain. EXAM: PORTABLE CHEST 1 VIEW COMPARISON:  06/23/2014 FINDINGS: Cardiomediastinal silhouette unchanged, with cardiomegaly. Atherosclerosis. Fullness in the central vasculature with indistinct margins. Interstitial opacities with interlobular septal thickening. Blunting of the bilateral costophrenic angles. No pneumothorax. IMPRESSION: Evidenced of developing CHF, with interstitial edema and likely small pleural effusions and atelectasis. Atherosclerosis. Signed, Dulcy Fanny. Earleen Newport, DO [facility] Radiology Electronically Signed   By: Corrie Mckusick D.O.   On: 10/29/2015 18:26     CBC  Recent Labs Lab 10/29/15 1806 10/30/15 0335  WBC 7.3 5.2  HGB 11.1* 10.1*  HCT 33.4* 30.8*  PLT 196 193  MCV 84.3 84.4  MCH  28.0 27.7  MCHC 33.2 32.8  RDW 13.9 14.1  LYMPHSABS 0.7  --   MONOABS 0.5  --   EOSABS 0.1  --   BASOSABS 0.0  --     Chemistries   Recent Labs Lab 10/25/15 1047 10/29/15 1806 10/30/15 0335  NA 141 134* 139  K 4.0 3.7 3.8  CL 105 98* 103  CO2 26 24 25   GLUCOSE 183* 226* 179*  BUN 30* 25* 24*  CREATININE 1.99* 1.91* 1.91*  CALCIUM 9.2 9.1 9.0  AST 15  --   --   ALT 13  --   --   ALKPHOS 60  --   --   BILITOT 0.5  --   --    ------------------------------------------------------------------------------------------------------------------ estimated creatinine clearance is 21.2 mL/min (by C-G formula based on Cr of 1.91). ------------------------------------------------------------------------------------------------------------------ No results for input(s): HGBA1C in the last 72  hours. ------------------------------------------------------------------------------------------------------------------ No results for input(s): CHOL, HDL, LDLCALC, TRIG, CHOLHDL, LDLDIRECT in the last 72 hours. ------------------------------------------------------------------------------------------------------------------ No results for input(s): TSH, T4TOTAL, T3FREE, THYROIDAB in the last 72 hours.  Invalid input(s): FREET3 ------------------------------------------------------------------------------------------------------------------ No results for input(s): VITAMINB12, FOLATE, FERRITIN, TIBC, IRON, RETICCTPCT in the last 72 hours.  Coagulation profile  Recent Labs Lab 10/25/15 1029 10/29/15 1806 10/30/15 0335  INR 3.8 3.48* 3.51*    No results for input(s): DDIMER in the last 72 hours.  Cardiac Enzymes  Recent Labs Lab 10/29/15 2240 10/30/15 0335 10/30/15 1125  TROPONINI 0.03 <0.03 <0.03   ------------------------------------------------------------------------------------------------------------------ Invalid input(s): POCBNP   CBG:  Recent Labs Lab 10/24/15 1130 10/29/15 2208 10/30/15 0816  GLUCAP 170* 193* 166*       Studies: Dg Chest Port 1 View  10/30/2015  CLINICAL DATA:  Evaluate for pulmonary edema EXAM: PORTABLE CHEST 1 VIEW COMPARISON:  10/29/2015 FINDINGS: The heart size appears mildly enlarged. There is aortic atherosclerosis. No airspace consolidation identified. Bilateral pleural effusions are identified. There has been mild improvement in interstitial edema. Persistent bibasilar atelectasis. IMPRESSION: 1. Mild CHF with slight improvement in interstitial edema pattern. Electronically Signed   By: Kerby Moors M.D.   On: 10/30/2015 11:59   Dg Chest Portable 1 View  10/29/2015  CLINICAL DATA:  81 year old female with a history of shortness of breath and chest pain. EXAM: PORTABLE CHEST 1 VIEW COMPARISON:  06/23/2014 FINDINGS:  Cardiomediastinal silhouette unchanged, with cardiomegaly. Atherosclerosis. Fullness in the central vasculature with indistinct margins. Interstitial opacities with interlobular septal thickening. Blunting of the bilateral costophrenic angles. No pneumothorax. IMPRESSION: Evidenced of developing CHF, with interstitial edema and likely small pleural effusions and atelectasis. Atherosclerosis. Signed, Dulcy Fanny. Earleen Newport, DO [facility] Radiology Electronically Signed   By: Corrie Mckusick D.O.   On: 10/29/2015 18:26      Lab Results  Component Value Date   HGBA1C 5.8* 12/04/2014   Lab Results  Component Value Date   CREATININE 1.91* 10/30/2015       Scheduled Meds: . [START ON 10/31/2015] amiodarone  400 mg Oral Daily  . cloNIDine  0.2 mg Oral QHS  . diltiazem  180 mg Oral Daily  . ferrous sulfate  325 mg Oral BID WC  . furosemide  40 mg Intravenous Once  . hydrALAZINE  100 mg Oral TID  . insulin aspart  0-5 Units Subcutaneous QHS  . insulin aspart  0-9 Units Subcutaneous TID WC  . labetalol  300 mg Oral BID  . pantoprazole  40 mg Oral Daily  . potassium chloride  20 mEq Oral  BID  . pravastatin  20 mg Oral q1800  . Warfarin - Pharmacist Dosing Inpatient   Does not apply q1800   Continuous Infusions:   Active Problems:   Atrial fibrillation (HCC)   Essential hypertension   Hyperlipidemia   Diabetes mellitus type 2, uncontrolled, with complications (HCC)   Hyperaldosteronism (HCC)   Chronic diastolic heart failure (HCC)   CKD (chronic kidney disease) stage 3, GFR 30-59 ml/min   Acute on chronic diastolic heart failure ([place])    Time spent: 45 minutes   [facility] Pager [phone]. If 7PM-7AM, please contact night-coverage at [email or web address], password [facility] 10/30/2015, 12:33 PM  LOS: 1 day

## 2015-10-30 NOTE — Progress Notes (Signed)
Pt arrived on the floor in the company of RN and NT. Placed on Telemetry. Oriented to the room. No complaints of pain. Turned on the chair alarm with the patient seated in it.

## 2015-10-30 NOTE — Progress Notes (Signed)
ANTICOAGULATION CONSULT NOTE - Follow Up Consult  Pharmacy Consult for warfarin Indication: atrial fibrillation  Allergies  Allergen Reactions  . Levaquin [Levofloxacin] Hives, Itching and Other (See Comments)    insomnia  . Sulfa Antibiotics Rash    Patient Measurements: Height: 5\' 4"  (162.6 cm) Weight: 150 lb 9.2 oz (68.3 kg) IBW/kg (Calculated) : 54.7   Vital Signs: Temp: 97.3 F (36.3 C) (10/30 1211) Temp Source: Oral (10/30 1211) BP: 137/67 mmHg (10/30 1211) Pulse Rate: 62 (10/30 1211)  Labs:  Recent Labs  10/29/15 1806  10/29/15 2240 10/30/15 0335 10/30/15 1125  HGB 11.1*  --   --  10.1*  --   HCT 33.4*  --   --  30.8*  --   PLT 196  --   --  193  --   LABPROT 34.2*  --   --  34.4*  --   INR 3.48*  --   --  3.51*  --   CREATININE 1.91*  --   --  1.91*  --   TROPONINI  --   < > 0.03 <0.03 <0.03  < > = values in this interval not displayed.  Estimated Creatinine Clearance: 21.2 mL/min (by C-G formula based on Cr of 1.91).   Medical History: Past Medical History  Diagnosis Date  . Hypertension   . Allergy   . GERD (gastroesophageal reflux disease)   . Hyperlipidemia   . Osteoporosis   . Chronic bronchitis ([place])     "lately" (06/23/2014)  . Type II diabetes mellitus ([place])   . H/O hiatal hernia   . Arthritis     "fingers and back" (06/23/2014)  . Chronic kidney disease (CKD), stage III (moderate)   . Basal cell carcinoma   . Paroxysmal a-fib ([place])   . Dysrhythmia   . CHF (congestive heart failure) [facility])    Assessment: 81 y.o. female with a past medical history significant for NIDDM, HTN, COPD, and persistent A. fib on warfarin who failed cardioversion on Monday and started on Tuesday on amiodarone who presents with worsening shortness of breath over the last week. INR 3.48 on admission.  INR 3.51 supratherapeutic today. Hgb 10.1, plt wnl. No bleeding noted.   Patient was also high at last clinic visit at 3.8 on 10/25. Sent home on 5mg  daily except  4mg  on Tuesdays and Saturdays. Started on amio 10/25.  Goal of Therapy:  INR 2-3 Monitor platelets by anticoagulation protocol: Yes   Plan:  Hold warfarin tonight Daily INR Monitor s/sx bleeding    Heloise Ochoa, Pharm.D. PGY2 Cardiology Pharmacy Resident Pager: [phone]  10/30/2015 3:21 PM

## 2015-10-30 NOTE — Consult Note (Signed)
CONSULTATION NOTE  Reason for Consult: Shortness of breath   Requesting Physician: Dr. Allyson Sabal  Cardiologist: Dr. Tamala Julian  HPI: This is a 81 y.o. female with a past medical history significant for recurrent shortness of breath, chest discomfort and palpitations. She has a history of paroxysmal atrial fibrillation, chronic diastolic heart failure, and chronic kidney disease and has predominantly been in sinus rhythm until recently when she was noted to go into A. fib with RVR. She had been therapeutically anticoagulated on warfarin. She was seen by Dr. Burt Knack and started on calcium channel blocker and cardioversion was arranged with me last week. On arrival to the endoscopy suite she was noted to be in A. fib with a fast ventricular response. She underwent unsuccessful cardioversion after 3 shocks. I elected to increase her Cardizem to 240 mg daily for better rate control. She was subsequently seen back in the office by Ellen Henri, PA-C / Dr. Pernell Dupre. I discussed medical options of rate versus rhythm control and proceeded to load the patient on amiodarone on 10/25/2015. Over the past week, Mrs. Bahner has developed progressive worsening of shortness of breath and presented in extremis with shortness of breath, diaphoresis and chest heaviness. In the ER she was hypertensive, tachypneic with an elevated BNP of 429 and chest x-ray findings showing pulmonary edema. She was given IV Lasix and placed on BiPAP. IV nitroglycerin was started for hypertensive urgency. Troponin is negative 3 overnight. INR is therapeutic at 3.51. Cardiology is asked to consult regarding atrial fibrillation and shortness of breath.  PMHx:  Past Medical History  Diagnosis Date  . Hypertension   . Allergy   . GERD (gastroesophageal reflux disease)   . Hyperlipidemia   . Osteoporosis   . Chronic bronchitis ([place])     "lately" (06/23/2014)  . Type II diabetes mellitus ([place])   . H/O hiatal hernia   . Arthritis    "fingers and back" (06/23/2014)  . Chronic kidney disease (CKD), stage III (moderate)   . Basal cell carcinoma   . Paroxysmal a-fib ([place])   . Dysrhythmia   . CHF (congestive heart failure) [facility])    Past Surgical History  Procedure Laterality Date  . Abdominal hysterectomy    . Appendectomy    . Basal cell carcinoma excision      "face and nose"  . Cardioversion N/A 10/24/2015    Procedure: CARDIOVERSION;  Surgeon: Pixie Casino, MD;  Location: [facility] ENDOSCOPY;  Service: Cardiovascular;  Laterality: N/A;    FAMHx: Family History  Problem Relation Age of Onset  . Tuberculosis Mother   . Heart disease Father     bad valve  . Heart disease Brother     died at 8  . Heart attack Father   . Heart attack Paternal Grandmother   . Heart attack Brother   . Hypertension Neg Hx   . Stroke Neg Hx     SOCHx:  reports that she has never smoked. She has never used smokeless tobacco. She reports that she does not drink alcohol or use illicit drugs.  ALLERGIES: Allergies  Allergen Reactions  . Levaquin [Levofloxacin] Hives, Itching and Other (See Comments)    insomnia  . Sulfa Antibiotics Rash    ROS: A comprehensive review of systems was negative except for: Respiratory: positive for dyspnea on exertion  HOME MEDICATIONS:   Medication List    ASK your doctor about these medications        amiodarone 400 MG tablet  Commonly  known as:  PACERONE  Take 1 tablet (400 mg total) by mouth 2 (two) times daily.     cloNIDine 0.2 MG tablet  Commonly known as:  CATAPRES  Take 0.2 mg by mouth at bedtime.     desloratadine 5 MG tablet  Commonly known as:  CLARINEX  Take 5 mg by mouth daily.     diltiazem 180 MG 24 hr capsule  Commonly known as:  CARDIZEM CD  Take 180 mg by mouth daily.     ferrous sulfate 325 (65 FE) MG tablet  Take 325 mg by mouth 2 (two) times daily.     fluticasone 50 MCG/ACT nasal spray  Commonly known as:  FLONASE  Place 1 spray into both nostrils daily  as needed for allergies or rhinitis.     furosemide 20 MG tablet  Commonly known as:  LASIX  Take 2 tablets (40 mg total) by mouth daily.     glimepiride 1 MG tablet  Commonly known as:  AMARYL  Take 1 tablet (1 mg total) by mouth daily with breakfast.     HAIR/SKIN/NAILS/BIOTIN Tabs  Take 1 tablet by mouth daily.     hydrALAZINE 50 MG tablet  Commonly known as:  APRESOLINE  Take 100 mg by mouth 3 (three) times daily.     labetalol 300 MG tablet  Commonly known as:  NORMODYNE  Take 300 mg by mouth 2 (two) times daily.     lovastatin 40 MG tablet  Commonly known as:  MEVACOR  Take 1 tablet (40 mg total) by mouth at bedtime.     multivitamin with minerals Tabs tablet  Take 1 tablet by mouth daily. One a Day Women's     pantoprazole 40 MG tablet  Commonly known as:  PROTONIX  Take 40 mg by mouth daily.     REFRESH OP  Place 1 drop into both eyes 2 (two) times daily as needed (for dry eyes).     vitamin A 8000 UNIT capsule  Take 8,000 Units by mouth daily.     warfarin 2 MG tablet  Commonly known as:  COUMADIN  TAKE AS DIRECTED BY  COUMADIN  CLINIC        HOSPITAL MEDICATIONS: I have reviewed the patient's current medications.  VITALS: Blood pressure 166/68, pulse 60, temperature 97.7 F (36.5 C), temperature source Oral, resp. rate 18, height '5\' 4"'  (1.626 m), weight 150 lb 9.2 oz (68.3 kg), SpO2 97 %.  PHYSICAL EXAM: General appearance: alert and no distress Neck: JVD - 3 cm above sternal notch and no carotid bruit Lungs: diminished breath sounds bibasilar Heart: regular rate and rhythm, S1, S2 normal, no murmur, click, rub or gallop Abdomen: soft, non-tender; bowel sounds normal; no masses,  no organomegaly Extremities: extremities normal, atraumatic, no cyanosis or edema Pulses: 2+ and symmetric Skin: Skin color, texture, turgor normal. No rashes or lesions Neurologic: Grossly normal Psych: Pleasant  LABS: Results for orders placed or performed during  the hospital encounter of 10/29/15 (from the past 48 hour(s))  Basic metabolic panel     Status: Abnormal   Collection Time: 10/29/15  6:06 PM  Result Value Ref Range   Sodium 134 (L) 135 - 145 mmol/L   Potassium 3.7 3.5 - 5.1 mmol/L   Chloride 98 (L) 101 - 111 mmol/L   CO2 24 22 - 32 mmol/L   Glucose, Bld 226 (H) 65 - 99 mg/dL   BUN 25 (H) 6 - 20 mg/dL   Creatinine, Ser  1.91 (H) 0.44 - 1.00 mg/dL   Calcium 9.1 8.9 - 10.3 mg/dL   GFR calc non Af Amer 23 (L) >60 mL/min   GFR calc Af Amer 27 (L) >60 mL/min    Comment: (NOTE) The eGFR has been calculated using the CKD EPI equation. This calculation has not been validated in all clinical situations. eGFR's persistently <60 mL/min signify possible Chronic Kidney Disease.    Anion gap 12 5 - 15  CBC with Differential     Status: Abnormal   Collection Time: 10/29/15  6:06 PM  Result Value Ref Range   WBC 7.3 4.0 - 10.5 K/uL   RBC 3.96 3.87 - 5.11 MIL/uL   Hemoglobin 11.1 (L) 12.0 - 15.0 g/dL   HCT 33.4 (L) 36.0 - 46.0 %   MCV 84.3 78.0 - 100.0 fL   MCH 28.0 26.0 - 34.0 pg   MCHC 33.2 30.0 - 36.0 g/dL   RDW 13.9 11.5 - 15.5 %   Platelets 196 150 - 400 K/uL   Neutrophils Relative % 81 %   Neutro Abs 6.0 1.7 - 7.7 K/uL   Lymphocytes Relative 10 %   Lymphs Abs 0.7 0.7 - 4.0 K/uL   Monocytes Relative 7 %   Monocytes Absolute 0.5 0.1 - 1.0 K/uL   Eosinophils Relative 2 %   Eosinophils Absolute 0.1 0.0 - 0.7 K/uL   Basophils Relative 0 %   Basophils Absolute 0.0 0.0 - 0.1 K/uL  Protime-INR     Status: Abnormal   Collection Time: 10/29/15  6:06 PM  Result Value Ref Range   Prothrombin Time 34.2 (H) 11.6 - 15.2 seconds   INR 3.48 (H) 0.00 - 1.49  Brain natriuretic peptide     Status: Abnormal   Collection Time: 10/29/15  6:20 PM  Result Value Ref Range   B Natriuretic Peptide 429.7 (H) 0.0 - 100.0 pg/mL  Troponin I     Status: None   Collection Time: 10/29/15  6:20 PM  Result Value Ref Range   Troponin I <0.03 <0.031 ng/mL     Comment:        NO INDICATION OF MYOCARDIAL INJURY.   I-stat troponin, ED     Status: None   Collection Time: 10/29/15  6:51 PM  Result Value Ref Range   Troponin i, poc 0.01 0.00 - 0.08 ng/mL   Comment 3            Comment: Due to the release kinetics of cTnI, a negative result within the first hours of the onset of symptoms does not rule out myocardial infarction with certainty. If myocardial infarction is still suspected, repeat the test at appropriate intervals.   MRSA PCR Screening     Status: None   Collection Time: 10/29/15  9:51 PM  Result Value Ref Range   MRSA by PCR NEGATIVE NEGATIVE    Comment:        The GeneXpert MRSA Assay (FDA approved for NASAL specimens only), is one component of a comprehensive MRSA colonization surveillance program. It is not intended to diagnose MRSA infection nor to guide or monitor treatment for MRSA infections.   Glucose, capillary     Status: Abnormal   Collection Time: 10/29/15 10:08 PM  Result Value Ref Range   Glucose-Capillary 193 (H) 65 - 99 mg/dL   Comment 1 Capillary Specimen   Troponin I     Status: None   Collection Time: 10/29/15 10:40 PM  Result Value Ref Range   Troponin  I 0.03 <0.031 ng/mL    Comment:        NO INDICATION OF MYOCARDIAL INJURY.   CBC     Status: Abnormal   Collection Time: 10/30/15  3:35 AM  Result Value Ref Range   WBC 5.2 4.0 - 10.5 K/uL   RBC 3.65 (L) 3.87 - 5.11 MIL/uL   Hemoglobin 10.1 (L) 12.0 - 15.0 g/dL   HCT 30.8 (L) 36.0 - 46.0 %   MCV 84.4 78.0 - 100.0 fL   MCH 27.7 26.0 - 34.0 pg   MCHC 32.8 30.0 - 36.0 g/dL   RDW 14.1 11.5 - 15.5 %   Platelets 193 150 - 400 K/uL  Basic metabolic panel     Status: Abnormal   Collection Time: 10/30/15  3:35 AM  Result Value Ref Range   Sodium 139 135 - 145 mmol/L   Potassium 3.8 3.5 - 5.1 mmol/L   Chloride 103 101 - 111 mmol/L   CO2 25 22 - 32 mmol/L   Glucose, Bld 179 (H) 65 - 99 mg/dL   BUN 24 (H) 6 - 20 mg/dL   Creatinine, Ser 1.91 (H)  0.44 - 1.00 mg/dL   Calcium 9.0 8.9 - 10.3 mg/dL   GFR calc non Af Amer 23 (L) >60 mL/min   GFR calc Af Amer 27 (L) >60 mL/min    Comment: (NOTE) The eGFR has been calculated using the CKD EPI equation. This calculation has not been validated in all clinical situations. eGFR's persistently <60 mL/min signify possible Chronic Kidney Disease.    Anion gap 11 5 - 15  Troponin I     Status: None   Collection Time: 10/30/15  3:35 AM  Result Value Ref Range   Troponin I <0.03 <0.031 ng/mL    Comment:        NO INDICATION OF MYOCARDIAL INJURY.   Protime-INR     Status: Abnormal   Collection Time: 10/30/15  3:35 AM  Result Value Ref Range   Prothrombin Time 34.4 (H) 11.6 - 15.2 seconds   INR 3.51 (H) 0.00 - 1.49    IMAGING: Dg Chest Portable 1 View  10/29/2015  CLINICAL DATA:  81 year old female with a history of shortness of breath and chest pain. EXAM: PORTABLE CHEST 1 VIEW COMPARISON:  06/23/2014 FINDINGS: Cardiomediastinal silhouette unchanged, with cardiomegaly. Atherosclerosis. Fullness in the central vasculature with indistinct margins. Interstitial opacities with interlobular septal thickening. Blunting of the bilateral costophrenic angles. No pneumothorax. IMPRESSION: Evidenced of developing CHF, with interstitial edema and likely small pleural effusions and atelectasis. Atherosclerosis. Signed, Dulcy Fanny. Earleen Newport, DO [facility] Radiology Electronically Signed   By: Corrie Mckusick D.O.   On: 10/29/2015 18:26    HOSPITAL DIAGNOSES: Active Problems:   Atrial fibrillation ([place])   Essential hypertension   Hyperlipidemia   Diabetes mellitus type 2, uncontrolled, with complications (HCC)   Hyperaldosteronism (HCC)   Chronic diastolic heart failure (HCC)   CKD (chronic kidney disease) stage 3, GFR 30-59 ml/min   Acute on chronic diastolic heart failure (HCC)   IMPRESSION: 1. Acute on chronic diastolic heart failure 2. Persistent  atrial fibrillation-converted to sinus rhythm on amiodarone  RECOMMENDATION: 1. Mrs. Krausz fortunately has converted back to sinus rhythm on amiodarone. She was acutely short of breath but her daughter feels that this was probably developing prior to starting on the medicine. X-ray does show some infiltrates, but it likely represents early developing pulmonary edema. I'm not certain this is amiodarone related.  Her white blood cell count is normal. Renal function is stable. I will decrease her amiodarone dose to 400 mg daily. We'll recheck a chest x-ray this morning. Will also obtain an echocardiogram as this was planned as an outpatient but never performed. She'll need an additional dose of diuretics today and most likely can be put back on oral diuretics and discharged tomorrow if she remains stable and is able to be weaned off oxygen.  Thanks for the consultation.  Time Spent Directly with Patient: 30 minutes  Pixie Casino, MD, [facility] Attending Cardiologist [address] 10/30/2015, 10:15 AM

## 2015-10-30 NOTE — Significant Event (Signed)
Paged re: worsened breathlessness while ambulating to bathroom.  Patient came back to bed and felt better. Was given 2L [place]. 95% on 2L. SBP had been climbing throughout the afternoon and reached 190s after missing her evening meds.  On exam, breathing comfortably on [place]. Scattered rales with elevated JVP.    She is volume overloaded but feels better. Suspect this is BP related as she was doing well with ambulating earlier in the day. She has been given the missed meds now. She (and daughter) asked if there is any way to avoid getting a diuretic overnight as it would keep her up -- she also ways to avoid a foley.  Plan Follow-up now that meds have been given; give PRN BP meds if not resolved within an hour 6am dose of lasix 40mg  IV ordered; will dose overnight if she feels worse in any way

## 2015-10-31 ENCOUNTER — Inpatient Hospital Stay (HOSPITAL_COMMUNITY): Payer: Medicare Other

## 2015-10-31 DIAGNOSIS — I480 Paroxysmal atrial fibrillation: Secondary | ICD-10-CM

## 2015-10-31 DIAGNOSIS — N183 Chronic kidney disease, stage 3 (moderate): Secondary | ICD-10-CM

## 2015-10-31 DIAGNOSIS — I4891 Unspecified atrial fibrillation: Secondary | ICD-10-CM

## 2015-10-31 LAB — BASIC METABOLIC PANEL
ANION GAP: 13 (ref 5–15)
BUN: 28 mg/dL — ABNORMAL HIGH (ref 6–20)
CHLORIDE: 104 mmol/L (ref 101–111)
CO2: 25 mmol/L (ref 22–32)
Calcium: 9.2 mg/dL (ref 8.9–10.3)
Creatinine, Ser: 1.94 mg/dL — ABNORMAL HIGH (ref 0.44–1.00)
GFR, EST AFRICAN AMERICAN: 26 mL/min — AB (ref >60)
GFR, EST NON AFRICAN AMERICAN: 23 mL/min — AB (ref >60)
Glucose, Bld: 180 mg/dL — ABNORMAL HIGH (ref 65–99)
POTASSIUM: 3.8 mmol/L (ref 3.5–5.1)
SODIUM: 142 mmol/L (ref 135–145)

## 2015-10-31 LAB — GLUCOSE, CAPILLARY
GLUCOSE-CAPILLARY: 159 mg/dL — AB (ref 65–99)
GLUCOSE-CAPILLARY: 178 mg/dL — AB (ref 65–99)
GLUCOSE-CAPILLARY: 199 mg/dL — AB (ref 65–99)
Glucose-Capillary: 145 mg/dL — ABNORMAL HIGH (ref 65–99)

## 2015-10-31 LAB — PROTIME-INR
INR: 3.99 — AB (ref 0.00–1.49)
Prothrombin Time: 37.9 seconds — ABNORMAL HIGH (ref 11.6–15.2)

## 2015-10-31 MED ORDER — FUROSEMIDE 10 MG/ML IJ SOLN: 40.0000 mg | Freq: Once | INTRAMUSCULAR | Status: DC

## 2015-10-31 MED ORDER — CLONIDINE HCL 0.2 MG PO TABS
0.2000 mg | ORAL_TABLET | Freq: Two times a day (BID) | ORAL | Status: DC
  Administered 2015-10-31 – 2015-11-01 (×3): 0.2 mg via ORAL
  Filled 2015-10-31 (×3): qty 1

## 2015-10-31 MED ORDER — FUROSEMIDE 10 MG/ML IJ SOLN
40.0000 mg | Freq: Once | INTRAMUSCULAR | Status: AC
  Administered 2015-10-31: 40 mg via INTRAVENOUS
  Filled 2015-10-31: qty 4

## 2015-10-31 NOTE — Progress Notes (Signed)
ANTICOAGULATION CONSULT NOTE - Follow Up Consult  Pharmacy Consult for warfarin Indication: atrial fibrillation  Allergies  Allergen Reactions  . Levaquin [Levofloxacin] Hives, Itching and Other (See Comments)    insomnia  . Sulfa Antibiotics Rash   Labs:  Recent Labs  10/29/15 1806  10/29/15 2240 10/30/15 0335 10/30/15 1125 10/31/15 0212 10/31/15 0830  HGB 11.1*  --   --  10.1*  --   --   --   HCT 33.4*  --   --  30.8*  --   --   --   PLT 196  --   --  193  --   --   --   LABPROT 34.2*  --   --  34.4*  --  37.9*  --   INR 3.48*  --   --  3.51*  --  3.99*  --   CREATININE 1.91*  --   --  1.91*  --   --  1.94*  TROPONINI  --   < > 0.03 <0.03 <0.03  --   --   < > = values in this interval not displayed.  Estimated Creatinine Clearance: 20.8 mL/min (by C-G formula based on Cr of 1.94).   Medical History: Past Medical History  Diagnosis Date  . Hypertension   . Allergy   . GERD (gastroesophageal reflux disease)   . Hyperlipidemia   . Osteoporosis   . Chronic bronchitis ([place])     "lately" (06/23/2014)  . Type II diabetes mellitus ([place])   . H/O hiatal hernia   . Arthritis     "fingers and back" (06/23/2014)  . Chronic kidney disease (CKD), stage III (moderate)   . Basal cell carcinoma   . Paroxysmal a-fib ([place])   . Dysrhythmia   . CHF (congestive heart failure) [facility])    Assessment: 81 y.o. female with a past medical history significant for NIDDM, HTN, COPD, and persistent A. fib on warfarin who failed cardioversion on Monday and started on Tuesday on amiodarone who presents with worsening shortness of breath over the last week. INR 3.48 on admission.  INR 3.99 supratherapeutic today. Hgb 10.1, plt wnl. No bleeding noted.   Patient was also high at last clinic visit at 3.8 on 10/25. Sent home on 5mg  daily except 4mg  on Tuesdays and Saturdays. Started on amio 10/25.  Goal of Therapy:  INR 2-3 Monitor platelets by anticoagulation protocol: Yes   Plan:  Hold  warfarin tonight Daily INR Monitor s/sx bleeding  Thank you Anette Guarneri, PharmD [phone]  10/31/2015 10:28 AM

## 2015-10-31 NOTE — Progress Notes (Signed)
Echocardiogram 2D Echocardiogram has been performed.  Joelene Millin 10/31/2015, 2:37 PM

## 2015-10-31 NOTE — Progress Notes (Signed)
[facility] PROGRESS NOTE  LORELEE MCLAURIN YFV:[id number] DOB: 1930/11/08 DOA: 10/29/2015 PCP: Anthoney Harada, MD  Length of stay: 2   Assessment/Plan: Active Problems:   Atrial fibrillation ([place])   Essential hypertension   Hyperlipidemia   Diabetes mellitus type 2, uncontrolled, with complications ([place])   Hyperaldosteronism (HCC)   Chronic diastolic heart failure (HCC)   CKD (chronic kidney disease) stage 3, GFR 30-59 ml/min   Acute on chronic diastolic heart failure ([place])    1. Acute on chronic diastolic CHF:  This is new. Last EF 55% in June 2016. Now presents after cardioversion and initiating amiodarone with respiratory distress, pulmonary edema on CXR and elevated BNP, improved with Bipap. Off BiPAP. Initial troponin normal. Patient converted from atrial fibrillation to normal sinus rhythm on 10/30. Repeat Chest x-ray shows improvement of pulmonary edema. Echocardiogram pending . She'll need an additional dose of diuretics today and most likely can be put back on oral diuretics and discharged tomorrow, appreciate cardiology input . -repeat Furosemide 40 mg IV once , still breathless while ambulating to the bathroom Cardiology following closely.  2. Atrial fibrillation on warfarin, amio, dilt:  Converted to normal sinus rhythm. ChADs2Vasc 6. -Continue amiodarone and warfarin and diltiazem INR is still supratherapeutic. Per cardiology note, plan is to decrease amiodarone to 100mg  in 2 weeks   3. HTN urgency:  Nitro drip off in ED. Continue hydralazine, clonidine, labetalol, diltiazem and add IV furosemide  4. Anemia of chronic disease and iron def.:  Stable. -Continue home iron. -Trend CBC  5. NIDDM: Stable Stable. Last HgbA1c in 2015 5.8%. -Hold home sulfonylurea -Correction insulin with meals if needed  6. COPD:  Stable.  -Continue home fluticasone and desloratadine  7. GERD:  Stable.  -Continue home PPI   DVT prophylaxsis   coumadin  Code Status:      Code Status Orders        Start     Ordered   10/29/15 2148  Full code   Continuous     10/29/15 2147    Advance Directive Documentation        Most Recent Value   Type of Advance Directive  Healthcare Power of Attorney   Pre-existing out of facility DNR order (yellow form or pink MOST form)     "MOST" Form in Place?       Family Communication: family updated about patient's clinical progress Disposition Plan: Continue inpatient, disposition per cardiology recommendations   Brief narrative: 81 y.o. female with a past medical history significant for recurrent shortness of breath, chest discomfort and palpitations. She has a history of paroxysmal atrial fibrillation, chronic diastolic heart failure, and chronic kidney disease and has predominantly been in sinus rhythm until recently when she was noted to go into A. fib with RVR. She had been therapeutically anticoagulated on warfarin. She was seen by Dr. Burt Knack and started on calcium channel blocker and cardioversion was arranged with me last week. On arrival to the endoscopy suite she was noted to be in A. fib with a fast ventricular response. She underwent unsuccessful cardioversion after 3 shocks. I elected to increase her Cardizem to 240 mg daily for better rate control. She was subsequently seen back in the office by Ellen Henri, PA-C / Dr. Pernell Dupre. I discussed medical options of rate versus rhythm control and proceeded to load the patient on amiodarone on 10/25/2015. Over the past week, 81 Mrs. Formanek has developed progressive worsening of shortness of breath and presented  in extremis with shortness of breath, diaphoresis and chest heaviness. In the ER she was hypertensive, tachypneic with an elevated BNP of 429 and chest x-ray findings showing pulmonary edema. She was given IV Lasix and placed on BiPAP. IV nitroglycerin was started for hypertensive urgency. Troponin is negative 3 overnight. INR is  therapeutic at 3.51. Cardiology is asked to consult regarding atrial fibrillation and shortness of breath.  Consultants:  cardiology  Procedures:  None   Antibiotics: Anti-infectives    None         HPI/Subjective: Slept better than past 11 nights. Was a little SOB yesterday when walking, but feeling better overnight. No CP  Objective: Filed Vitals:   10/31/15 0041 10/31/15 0350 10/31/15 0859 10/31/15 0958  BP: 135/53 162/69 157/73 164/62  Pulse: 77 73 68 69  Temp: 98.9 F (37.2 C) 98.2 F (36.8 C) 98.9 F (37.2 C)   TempSrc: Oral Oral Oral   Resp: 24 20 18    Height:      Weight:  68.312 kg (150 lb 9.6 oz)    SpO2: 95% 95% 97%     Intake/Output Summary (Last 24 hours) at 10/31/15 1156 Last data filed at 10/31/15 1126  Gross per 24 hour  Intake   1135 ml  Output   1600 ml  Net   -465 ml    Exam:  General: No acute respiratory distress Lungs: Clear to auscultation bilaterally without wheezes or crackles Cardiovascular: Regular rate and rhythm without murmur gallop or rub normal S1 and S2 Abdomen: Nontender, nondistended, soft, bowel sounds positive, no rebound, no ascites, no appreciable mass Extremities: No significant cyanosis, clubbing, or edema bilateral lower extremities     Data Review   Micro Results Recent Results (from the past 240 hour(s))  MRSA PCR Screening     Status: None   Collection Time: 10/29/15  9:51 PM  Result Value Ref Range Status   MRSA by PCR NEGATIVE NEGATIVE Final    Comment:        The GeneXpert MRSA Assay (FDA approved for NASAL specimens only), is one component of a comprehensive MRSA colonization surveillance program. It is not intended to diagnose MRSA infection nor to guide or monitor treatment for MRSA infections.     Radiology Reports Dg Chest Port 1 View  10/30/2015  CLINICAL DATA:  Evaluate for pulmonary edema EXAM: PORTABLE CHEST 1 VIEW COMPARISON:  10/29/2015 FINDINGS: The heart size appears mildly  enlarged. There is aortic atherosclerosis. No airspace consolidation identified. Bilateral pleural effusions are identified. There has been mild improvement in interstitial edema. Persistent bibasilar atelectasis. IMPRESSION: 1. Mild CHF with slight improvement in interstitial edema pattern. Electronically Signed   By: Kerby Moors M.D.   On: 10/30/2015 11:59   Dg Chest Portable 1 View  10/29/2015  CLINICAL DATA:  80 year old female with a history of shortness of breath and chest pain. EXAM: PORTABLE CHEST 1 VIEW COMPARISON:  06/23/2014 FINDINGS: Cardiomediastinal silhouette unchanged, with cardiomegaly. Atherosclerosis. Fullness in the central vasculature with indistinct margins. Interstitial opacities with interlobular septal thickening. Blunting of the bilateral costophrenic angles. No pneumothorax. IMPRESSION: Evidenced of developing CHF, with interstitial edema and likely small pleural effusions and atelectasis. Atherosclerosis. Signed, Dulcy Fanny. Earleen Newport, DO [facility] Radiology Electronically Signed   By: Corrie Mckusick D.O.   On: 10/29/2015 18:26     CBC  Recent Labs Lab 10/29/15 1806 10/30/15 0335  WBC 7.3 5.2  HGB 11.1* 10.1*  HCT 33.4* 30.8*  PLT  196 193  MCV 84.3 84.4  MCH 28.0 27.7  MCHC 33.2 32.8  RDW 13.9 14.1  LYMPHSABS 0.7  --   MONOABS 0.5  --   EOSABS 0.1  --   BASOSABS 0.0  --     Chemistries   Recent Labs Lab 10/25/15 1047 10/29/15 1806 10/30/15 0335 10/31/15 0830  NA 141 134* 139 142  K 4.0 3.7 3.8 3.8  CL 105 98* 103 104  CO2 26 24 25 25   GLUCOSE 183* 226* 179* 180*  BUN 30* 25* 24* 28*  CREATININE 1.99* 1.91* 1.91* 1.94*  CALCIUM 9.2 9.1 9.0 9.2  AST 15  --   --   --   ALT 13  --   --   --   ALKPHOS 60  --   --   --   BILITOT 0.5  --   --   --    ------------------------------------------------------------------------------------------------------------------ estimated creatinine clearance is 20.8  mL/min (by C-G formula based on Cr of 1.94). ------------------------------------------------------------------------------------------------------------------ No results for input(s): HGBA1C in the last 72 hours. ------------------------------------------------------------------------------------------------------------------ No results for input(s): CHOL, HDL, LDLCALC, TRIG, CHOLHDL, LDLDIRECT in the last 72 hours. ------------------------------------------------------------------------------------------------------------------ No results for input(s): TSH, T4TOTAL, T3FREE, THYROIDAB in the last 72 hours.  Invalid input(s): FREET3 ------------------------------------------------------------------------------------------------------------------ No results for input(s): VITAMINB12, FOLATE, FERRITIN, TIBC, IRON, RETICCTPCT in the last 72 hours.  Coagulation profile  Recent Labs Lab 10/25/15 1029 10/29/15 1806 10/30/15 0335 10/31/15 0212  INR 3.8 3.48* 3.51* 3.99*    No results for input(s): DDIMER in the last 72 hours.  Cardiac Enzymes  Recent Labs Lab 10/29/15 2240 10/30/15 0335 10/30/15 1125  TROPONINI 0.03 <0.03 <0.03   ------------------------------------------------------------------------------------------------------------------ Invalid input(s): POCBNP   CBG:  Recent Labs Lab 10/30/15 0816 10/30/15 1213 10/30/15 2219 10/31/15 0703 10/31/15 1125  GLUCAP 166* 170* 177* 159* 178*       Studies: Dg Chest Port 1 View  10/30/2015  CLINICAL DATA:  Evaluate for pulmonary edema EXAM: PORTABLE CHEST 1 VIEW COMPARISON:  10/29/2015 FINDINGS: The heart size appears mildly enlarged. There is aortic atherosclerosis. No airspace consolidation identified. Bilateral pleural effusions are identified. There has been mild improvement in interstitial edema. Persistent bibasilar atelectasis. IMPRESSION: 1. Mild CHF with slight improvement in interstitial edema pattern.  Electronically Signed   By: Kerby Moors M.D.   On: 10/30/2015 11:59   Dg Chest Portable 1 View  10/29/2015  CLINICAL DATA:  81 year old female with a history of shortness of breath and chest pain. EXAM: PORTABLE CHEST 1 VIEW COMPARISON:  06/23/2014 FINDINGS: Cardiomediastinal silhouette unchanged, with cardiomegaly. Atherosclerosis. Fullness in the central vasculature with indistinct margins. Interstitial opacities with interlobular septal thickening. Blunting of the bilateral costophrenic angles. No pneumothorax. IMPRESSION: Evidenced of developing CHF, with interstitial edema and likely small pleural effusions and atelectasis. Atherosclerosis. Signed, Dulcy Fanny. Earleen Newport, DO [facility] Radiology Electronically Signed   By: Corrie Mckusick D.O.   On: 10/29/2015 18:26      Lab Results  Component Value Date   HGBA1C 5.8* 12/04/2014   Lab Results  Component Value Date   CREATININE 1.94* 10/31/2015       Scheduled Meds: . amiodarone  400 mg Oral Daily  . cloNIDine  0.2 mg Oral QHS  . diltiazem  180 mg Oral Daily  . ferrous sulfate  325 mg Oral BID WC  . furosemide  40 mg Intravenous Once  . hydrALAZINE  100 mg Oral TID  . insulin aspart  0-5 Units  Subcutaneous QHS  . insulin aspart  0-9 Units Subcutaneous TID WC  . labetalol  300 mg Oral BID  . pantoprazole  40 mg Oral Daily  . potassium chloride  20 mEq Oral BID  . pravastatin  20 mg Oral q1800  . Warfarin - Pharmacist Dosing Inpatient   Does not apply q1800   Continuous Infusions:   Active Problems:   Atrial fibrillation (HCC)   Essential hypertension   Hyperlipidemia   Diabetes mellitus type 2, uncontrolled, with complications (HCC)   Hyperaldosteronism (HCC)   Chronic diastolic heart failure (HCC)   CKD (chronic kidney disease) stage 3, GFR 30-59 ml/min   Acute on chronic diastolic heart failure ([place])    Time spent: 45 minutes   [facility] Pager [phone]. If 7PM-7AM, please contact night-coverage at [email or web address], password [facility] 10/31/2015, 11:56 AM  LOS: 2 days

## 2015-10-31 NOTE — Progress Notes (Signed)
Patient Name: Ariel Avila Date of Encounter: 10/31/2015  Primary Cardiologist: Dr. Tamala Julian   Active Problems:   Atrial fibrillation [facility])   Essential hypertension   Hyperlipidemia   Diabetes mellitus type 2, uncontrolled, with complications (HCC)   Hyperaldosteronism (HCC)   Chronic diastolic heart failure (HCC)   CKD (chronic kidney disease) stage 3, GFR 30-59 ml/min   Acute on chronic diastolic heart failure ([place])    SUBJECTIVE  Slept better than past 11 nights. Was a little SOB yesterday when walking, but feeling better overnight. No CP.   CURRENT MEDS . amiodarone  400 mg Oral Daily  . cloNIDine  0.2 mg Oral QHS  . diltiazem  180 mg Oral Daily  . ferrous sulfate  325 mg Oral BID WC  . furosemide  40 mg Intravenous Once  . hydrALAZINE  100 mg Oral TID  . insulin aspart  0-5 Units Subcutaneous QHS  . insulin aspart  0-9 Units Subcutaneous TID WC  . labetalol  300 mg Oral BID  . pantoprazole  40 mg Oral Daily  . potassium chloride  20 mEq Oral BID  . pravastatin  20 mg Oral q1800  . Warfarin - Pharmacist Dosing Inpatient   Does not apply q1800    OBJECTIVE  Filed Vitals:   10/30/15 1725 10/30/15 2131 10/31/15 0041 10/31/15 0350  BP: 150/58 171/67 135/53 162/69  Pulse:  74 77 73  Temp:  98.6 F (37 C) 98.9 F (37.2 C) 98.2 F (36.8 C)  TempSrc:  Oral Oral Oral  Resp:  18 24 20   Height:      Weight:    150 lb 9.6 oz (68.312 kg)  SpO2:  95% 95% 95%    Intake/Output Summary (Last 24 hours) at 10/31/15 4158 Last data filed at 10/31/15 0350  Gross per 24 hour  Intake    895 ml  Output   1700 ml  Net   -805 ml   Filed Weights   10/30/15 0407 10/30/15 1549 10/31/15 0350  Weight: 150 lb 9.2 oz (68.3 kg) 150 lb 6.4 oz (68.221 kg) 150 lb 9.6 oz (68.312 kg)    PHYSICAL EXAM  General: Pleasant, NAD. Neuro: Alert and oriented X 3. Moves all extremities spontaneously. Psych: Normal affect. HEENT:  Normal  Neck: Supple without bruits  +JVD. Lungs:  Resp  regular and unlabored. Bibasilar fine crackle. Heart: RRR no s3, s4, or murmurs. Abdomen: Soft, non-tender, non-distended, BS + x 4.  Extremities: No clubbing, cyanosis or edema. DP/PT/Radials 2+ and equal bilaterally.  Accessory Clinical Findings  CBC  Recent Labs  10/29/15 1806 10/30/15 0335  WBC 7.3 5.2  NEUTROABS 6.0  --   HGB 11.1* 10.1*  HCT 33.4* 30.8*  MCV 84.3 84.4  PLT 196 309   Basic Metabolic Panel  Recent Labs  10/29/15 1806 10/30/15 0335  NA 134* 139  K 3.7 3.8  CL 98* 103  CO2 24 25  GLUCOSE 226* 179*  BUN 25* 24*  CREATININE 1.91* 1.91*  CALCIUM 9.1 9.0   Cardiac Enzymes  Recent Labs  10/29/15 2240 10/30/15 0335 10/30/15 1125  TROPONINI 0.03 <0.03 <0.03   TELE NSR with HR 70s, borderline 1st degree AV block    ECG  No new EKG  Echocardiogram  pending    Radiology/Studies  Dg Chest Port 1 View  10/30/2015  CLINICAL DATA:  Evaluate for pulmonary edema EXAM: PORTABLE CHEST 1 VIEW COMPARISON:  10/29/2015 FINDINGS: The heart size appears mildly enlarged. There is  aortic atherosclerosis. No airspace consolidation identified. Bilateral pleural effusions are identified. There has been mild improvement in interstitial edema. Persistent bibasilar atelectasis. IMPRESSION: 1. Mild CHF with slight improvement in interstitial edema pattern. Electronically Signed   By: Kerby Moors M.D.   On: 10/30/2015 11:59   Dg Chest Portable 1 View  10/29/2015  CLINICAL DATA:  81 year old female with a history of shortness of breath and chest pain. EXAM: PORTABLE CHEST 1 VIEW COMPARISON:  06/23/2014 FINDINGS: Cardiomediastinal silhouette unchanged, with cardiomegaly. Atherosclerosis. Fullness in the central vasculature with indistinct margins. Interstitial opacities with interlobular septal thickening. Blunting of the bilateral costophrenic angles. No pneumothorax. IMPRESSION: Evidenced of developing CHF, with interstitial edema and likely small pleural  effusions and atelectasis. Atherosclerosis. Signed, Dulcy Fanny. Earleen Newport, DO [facility] Radiology Electronically Signed   By: Corrie Mckusick D.O.   On: 10/29/2015 18:26    ASSESSMENT AND PLAN  81 yo female with PMH of PAF on coumadin, chronic diastolic HF, CKD who recently went back into afib and was loaded on amio as outpatient in preparation for DCCV came in with increasing SOB, hypertensive urgency and found to have pulm edema.   1. Acute on chronic diastolic HF in the setting hypertensive urgency  - per overnight cardiology fellow's note, still SOB esp with ambulation  - bibasilar fine crackle on exam, no obvious LE edema, mild JVD, will continue IV lasix, received 40mg  IV lasix this morning, will check BMET, ordered additional 40mg  IV lasix this afternoon  - will monitor for any sign of amio toxicity. If SOB gets worse despite lasix, may need to hold amio.  - pending echocardiogram today  2. Hypertensive urgency  - continue clonidine, hydralazine, diltiazem and labetalol. Per pt, has chronic problem managing BP.   3. PAF on coumadin  - converted back to NSR after loading with amio as outpatient  - amiodarone decreased down to 400mg  daily on 10/31. Per office note on 10/25, plan decrease amiodarone to 100mg  in 2 weeks (11/8)   4. CKD stage IV 5. HTN 6. HLD 7. DM II  Signed, Almyra Deforest PA-C Pager: [phone]

## 2015-10-31 NOTE — Progress Notes (Signed)
Heart Failure Navigator Consult Note  Presentation: Ariel Avila is a 81 y.o. female with a past medical history significant for NIDDM, HTN, COPD, and persistent A. fib on warfarin who failed cardioversion on Monday and started on Tuesday on amiodarone who presents with worsening shortness of breath over the last week.  The patient has had long-standing paroxysmal atrial fibrillation recently found to be persistently in A. fib with rapid rate. She underwent failed DC cardioversion by Dr. Debara Pickett on Monday of this week and was started on Tuesday on oral amiodarone loading. She and her daughter now report that although she has been dyspneic since her persistent rapid A. fib started 2 weeks ago, in the last 5 days this has gotten progressively and persistently worse to the point that tonight she felt severely short of breath, diaphoretic, and with chest heaviness. She has not had increased leg swelling or weight gain.  In the ED, the patient was hypertensive, tachypneic, and had elevated BNP. Initial troponin was negative. Chest x-ray showed pulmonary edema and the patient was given furosemide 40 mg IV and put on BiPAP with improvement. A nitroglycerin drip was started for hypertensive urgency.     Past Medical History  Diagnosis Date  . Hypertension   . Allergy   . GERD (gastroesophageal reflux disease)   . Hyperlipidemia   . Osteoporosis   . Chronic bronchitis ([place])     "lately" (06/23/2014)  . Type II diabetes mellitus ([place])   . H/O hiatal hernia   . Arthritis     "fingers and back" (06/23/2014)  . Chronic kidney disease (CKD), stage III (moderate)   . Basal cell carcinoma   . Paroxysmal a-fib ([place])   . Dysrhythmia   . CHF (congestive heart failure) ([place])     Social History   Social History  . Marital Status: Married    Spouse Name: N/A  . Number of Children: N/A  . Years of Education: N/A   Social History Main Topics  . Smoking status: Never Smoker   . Smokeless tobacco:  Never Used  . Alcohol Use: No  . Drug Use: No  . Sexual Activity: No   Other Topics Concern  . None   Social History Narrative    ECHO: new echo results pending  BNP    Component Value Date/Time   BNP 429.7* 10/29/2015 1820    ProBNP    Component Value Date/Time   PROBNP 101.0* 08/17/2014 0838     Education Assessment and Provision:  Detailed education and instructions provided on heart failure disease management including the following:  Signs and symptoms of Heart Failure When to call the physician Importance of daily weights Low sodium diet Fluid restriction Medication management Anticipated future follow-up appointments  Patient education given on each of the above topics.  Patient acknowledges understanding and acceptance of all instructions.  I spoke briefly with Ariel Avila and her daughter Ariel Avila (a Therapist, sports here at [facility]).  She has only recently began to have significant symptoms associated with AFib and HF.  She does have a scale and weighs daily.  I emphasized the importance of daily weights and how they relate to the signs and symptoms of HF.  She "tries" to avoid salt and sodium--however admits that recently has been eating "Doritos" daily and canned soups occasionally.  I discussed a low sodium diet and high sodium foods to avoid.  She denies any issues getting or taking medications and daughter says that she will be helping her mother  and stepfather with medications at home.  She lives here in [place] with her husband who has dementia and is quite dependent on the patient.  [facility] at [facility] as outpatient.  I will plan to return when echo complete to reinforce education.  Education Materials:  "Living Better With Heart Failure" Booklet, Daily Weight Tracker Tool and Heart Failure Educational Video.   High Risk Criteria for Readmission and/or Poor Patient Outcomes:   EF <30%- pending  2 or more admissions in 6 months-No  Difficult social situation-  No-lives home with husband who has dementia.  She has local support (daughter and son)  Demonstrates medication noncompliance- No    Barriers of Care:  Knowledge and acceptance of HF diagnosis, compliance  Discharge Planning:   Plans to return home with husband who has dementia.  Her daughter is a Marine scientist here at [facility] and also has a son locally who can provide support.

## 2015-11-01 ENCOUNTER — Other Ambulatory Visit: Payer: Self-pay | Admitting: Physician Assistant

## 2015-11-01 DIAGNOSIS — I5033 Acute on chronic diastolic (congestive) heart failure: Secondary | ICD-10-CM

## 2015-11-01 LAB — CBC
HCT: 31.7 % — ABNORMAL LOW (ref 36.0–46.0)
Hemoglobin: 10.5 g/dL — ABNORMAL LOW (ref 12.0–15.0)
MCH: 28.2 pg (ref 26.0–34.0)
MCHC: 33.1 g/dL (ref 30.0–36.0)
MCV: 85 fL (ref 78.0–100.0)
PLATELETS: 219 10*3/uL (ref 150–400)
RBC: 3.73 MIL/uL — AB (ref 3.87–5.11)
RDW: 14.1 % (ref 11.5–15.5)
WBC: 4.6 10*3/uL (ref 4.0–10.5)

## 2015-11-01 LAB — PROTIME-INR
INR: 3.09 — AB (ref 0.00–1.49)
Prothrombin Time: 31.3 seconds — ABNORMAL HIGH (ref 11.6–15.2)

## 2015-11-01 LAB — COMPREHENSIVE METABOLIC PANEL
ALK PHOS: 63 U/L (ref 38–126)
ALT: 14 U/L (ref 14–54)
AST: 17 U/L (ref 15–41)
Albumin: 3.3 g/dL — ABNORMAL LOW (ref 3.5–5.0)
Anion gap: 9 (ref 5–15)
BUN: 26 mg/dL — ABNORMAL HIGH (ref 6–20)
CALCIUM: 9.1 mg/dL (ref 8.9–10.3)
CO2: 25 mmol/L (ref 22–32)
CREATININE: 2 mg/dL — AB (ref 0.44–1.00)
Chloride: 106 mmol/L (ref 101–111)
GFR, EST AFRICAN AMERICAN: 25 mL/min — AB (ref >60)
GFR, EST NON AFRICAN AMERICAN: 22 mL/min — AB (ref >60)
Glucose, Bld: 174 mg/dL — ABNORMAL HIGH (ref 65–99)
Potassium: 4.1 mmol/L (ref 3.5–5.1)
Sodium: 140 mmol/L (ref 135–145)
TOTAL PROTEIN: 5.4 g/dL — AB (ref 6.5–8.1)
Total Bilirubin: 0.8 mg/dL (ref 0.3–1.2)

## 2015-11-01 LAB — GLUCOSE, CAPILLARY
GLUCOSE-CAPILLARY: 150 mg/dL — AB (ref 65–99)
Glucose-Capillary: 227 mg/dL — ABNORMAL HIGH (ref 65–99)

## 2015-11-01 MED ORDER — POTASSIUM CHLORIDE CRYS ER 20 MEQ PO TBCR: 20.0000 meq | EXTENDED_RELEASE_TABLET | Freq: Two times a day (BID) | ORAL | Status: DC

## 2015-11-01 MED ORDER — FUROSEMIDE 80 MG PO TABS: 80.0000 mg | ORAL_TABLET | Freq: Every day | ORAL | Status: DC

## 2015-11-01 MED ORDER — CLONIDINE HCL 0.2 MG PO TABS: 0.2000 mg | ORAL_TABLET | Freq: Two times a day (BID) | ORAL | Status: DC

## 2015-11-01 MED ORDER — AMIODARONE HCL 400 MG PO TABS: 400.0000 mg | ORAL_TABLET | Freq: Every day | ORAL | Status: DC

## 2015-11-01 MED ORDER — WARFARIN SODIUM 4 MG PO TABS: 2.0000 mg | ORAL_TABLET | Freq: Once | ORAL | Status: DC

## 2015-11-01 MED ORDER — WARFARIN SODIUM 2 MG PO TABS: ORAL_TABLET | ORAL | Status: DC

## 2015-11-01 MED ORDER — FUROSEMIDE 80 MG PO TABS
80.0000 mg | ORAL_TABLET | Freq: Every day | ORAL | Status: DC
  Administered 2015-11-01: 80 mg via ORAL
  Filled 2015-11-01: qty 1

## 2015-11-01 NOTE — Progress Notes (Signed)
ANTICOAGULATION CONSULT NOTE - Follow Up Consult  Pharmacy Consult for warfarin Indication: atrial fibrillation  Allergies  Allergen Reactions  . Levaquin [Levofloxacin] Hives, Itching and Other (See Comments)    insomnia  . Sulfa Antibiotics Rash   Labs:  Recent Labs  10/29/15 1806  10/29/15 2240 10/30/15 0335 10/30/15 1125 10/31/15 0212 10/31/15 0830 11/01/15 0459  HGB 11.1*  --   --  10.1*  --   --   --  10.5*  HCT 33.4*  --   --  30.8*  --   --   --  31.7*  PLT 196  --   --  193  --   --   --  219  LABPROT 34.2*  --   --  34.4*  --  37.9*  --  31.3*  INR 3.48*  --   --  3.51*  --  3.99*  --  3.09*  CREATININE 1.91*  --   --  1.91*  --   --  1.94* 2.00*  TROPONINI  --   < > 0.03 <0.03 <0.03  --   --   --   < > = values in this interval not displayed.  Estimated Creatinine Clearance: 20.2 mL/min (by C-G formula based on Cr of 2).   Medical History: Past Medical History  Diagnosis Date  . Hypertension   . Allergy   . GERD (gastroesophageal reflux disease)   . Hyperlipidemia   . Osteoporosis   . Chronic bronchitis ([place])     "lately" (06/23/2014)  . Type II diabetes mellitus ([place])   . H/O hiatal hernia   . Arthritis     "fingers and back" (06/23/2014)  . Chronic kidney disease (CKD), stage III (moderate)   . Basal cell carcinoma   . Paroxysmal a-fib ([place])   . Dysrhythmia   . CHF (congestive heart failure) [facility])    Assessment: 81 y.o. female with a past medical history significant for NIDDM, HTN, COPD, and persistent A. fib on warfarin who failed cardioversion on Monday and started on Tuesday on amiodarone who presents with worsening shortness of breath over the last week. INR 3.48 on admission.  INR 3.09, trending down  Patient was also high at last clinic visit at 3.8 on 10/25. Sent home on 5mg  daily except 4mg  on Tuesdays and Saturdays. Started on amio 10/25.  Goal of Therapy:  INR 2-3 Monitor platelets by anticoagulation protocol: Yes   Plan:   Coumadin 2 mg po x 1 Daily INR Monitor s/sx bleeding  Thank you Anette Guarneri, PharmD [phone]  11/01/2015 9:47 AM

## 2015-11-01 NOTE — Care Management Important Message (Signed)
Important Message  Patient Details  Name: Ariel Avila MRN: [id number] Date of Birth: March 20, 1930   Medicare Important Message Given:  Yes-second notification given    Nathen May 11/01/2015, 10:12 AM

## 2015-11-01 NOTE — Discharge Summary (Signed)
Physician Discharge Summary  Ariel Avila: [id number] DOB/AGE: January 03, 1930 81 y.o.  PCP: Anthoney Harada, MD   Admit date: 10/29/2015 Discharge date: 11/01/2015  Discharge Diagnoses:     Active Problems:   Atrial fibrillation (HCC)   Essential hypertension   Hyperlipidemia   Diabetes mellitus type 2, uncontrolled, with complications (HCC)   Hyperaldosteronism (HCC)   Chronic diastolic heart failure (HCC)   CKD (chronic kidney disease) stage 3, GFR 30-59 ml/min   Acute on chronic diastolic heart failure (HCC)    Follow-up recommendations Follow-up with PCP in 3-5 days , including all  additional recommended appointments as below Follow-up CBC, CMP in 3-5 days INR check at coumadin clinic q 72 hrs      Medication List    TAKE these medications        amiodarone 400 MG tablet  Commonly known as:  PACERONE  Take 1 tablet (400 mg total) by mouth daily.     cloNIDine 0.2 MG tablet  Commonly known as:  CATAPRES  Take 1 tablet (0.2 mg total) by mouth 2 (two) times daily.     desloratadine 5 MG tablet  Commonly known as:  CLARINEX  Take 5 mg by mouth daily.     diltiazem 180 MG 24 hr capsule  Commonly known as:  CARDIZEM CD  Take 180 mg by mouth daily.     ferrous sulfate 325 (65 FE) MG tablet  Take 325 mg by mouth 2 (two) times daily.     fluticasone 50 MCG/ACT nasal spray  Commonly known as:  FLONASE  Place 1 spray into both nostrils daily as needed for allergies or rhinitis.     furosemide 80 MG tablet  Commonly known as:  LASIX  Take 1 tablet (80 mg total) by mouth daily.     glimepiride 1 MG tablet  Commonly known as:  AMARYL  Take 1 tablet (1 mg total) by mouth daily with breakfast.     HAIR/SKIN/NAILS/BIOTIN Tabs  Take 1 tablet by mouth daily.     hydrALAZINE 50 MG tablet  Commonly known as:  APRESOLINE  Take 100 mg by mouth 3 (three) times daily.     labetalol 300 MG tablet  Commonly known as:  NORMODYNE  Take 300 mg by mouth 2 (two)  times daily.     lovastatin 40 MG tablet  Commonly known as:  MEVACOR  Take 1 tablet (40 mg total) by mouth at bedtime.     multivitamin with minerals Tabs tablet  Take 1 tablet by mouth daily. One a Day Women's     pantoprazole 40 MG tablet  Commonly known as:  PROTONIX  Take 40 mg by mouth daily.     potassium chloride SA 20 MEQ tablet  Commonly known as:  K-DUR,KLOR-CON  Take 1 tablet (20 mEq total) by mouth 2 (two) times daily.     REFRESH OP  Place 1 drop into both eyes 2 (two) times daily as needed (for dry eyes).     vitamin A 8000 UNIT capsule  Take 8,000 Units by mouth daily.     warfarin 2 MG tablet  Commonly known as:  COUMADIN  TAKE AS DIRECTED BY  COUMADIN  CLINIC         Discharge Condition: stable  Discharge Instructions       Discharge Instructions    Diet - low sodium heart healthy    Complete by:  As directed      Increase activity slowly  Complete by:  As directed            Allergies  Allergen Reactions  . Levaquin [Levofloxacin] Hives, Itching and Other (See Comments)    insomnia  . Sulfa Antibiotics Rash      Disposition: 01-Home or Self Care   Consults:  Cards     Significant Diagnostic Studies:  Dg Chest Port 1 View  10/30/2015  CLINICAL DATA:  Evaluate for pulmonary edema EXAM: PORTABLE CHEST 1 VIEW COMPARISON:  10/29/2015 FINDINGS: The heart size appears mildly enlarged. There is aortic atherosclerosis. No airspace consolidation identified. Bilateral pleural effusions are identified. There has been mild improvement in interstitial edema. Persistent bibasilar atelectasis. IMPRESSION: 1. Mild CHF with slight improvement in interstitial edema pattern. Electronically Signed   By: Kerby Moors M.D.   On: 10/30/2015 11:59   Dg Chest Portable 1 View  10/29/2015  CLINICAL DATA:  81 year old female with a history of shortness of breath and chest pain. EXAM: PORTABLE CHEST 1 VIEW COMPARISON:  06/23/2014 FINDINGS:  Cardiomediastinal silhouette unchanged, with cardiomegaly. Atherosclerosis. Fullness in the central vasculature with indistinct margins. Interstitial opacities with interlobular septal thickening. Blunting of the bilateral costophrenic angles. No pneumothorax. IMPRESSION: Evidenced of developing CHF, with interstitial edema and likely small pleural effusions and atelectasis. Atherosclerosis. Signed, Dulcy Fanny. Earleen Newport, DO [facility] Electronically Signed   By: Corrie Mckusick D.O.   On: 10/29/2015 18:26       Filed Weights   10/30/15 1549 10/31/15 0350 11/01/15 0445  Weight: 68.221 kg (150 lb 6.4 oz) 68.312 kg (150 lb 9.6 oz) 67.903 kg (149 lb 11.2 oz)     Microbiology: Recent Results (from the past 240 hour(s))  MRSA PCR Screening     Status: None   Collection Time: 10/29/15  9:51 PM  Result Value Ref Range Status   MRSA by PCR NEGATIVE NEGATIVE Final    Comment:        The GeneXpert MRSA Assay (FDA approved for NASAL specimens only), is one component of a comprehensive MRSA colonization surveillance program. It is not intended to diagnose MRSA infection nor to guide or monitor treatment for MRSA infections.        Blood Culture No results found for: SDES, [place], CULT, REPTSTATUS    Labs: Results for orders placed or performed during the hospital encounter of 10/29/15 (from the past 48 hour(s))  Glucose, capillary     Status: Abnormal   Collection Time: 10/30/15 10:19 PM  Result Value Ref Range   Glucose-Capillary 177 (H) 65 - 99 mg/dL   Comment 1 Notify RN    Comment 2 Document in Chart   Protime-INR     Status: Abnormal   Collection Time: 10/31/15  2:12 AM  Result Value Ref Range   Prothrombin Time 37.9 (H) 11.6 - 15.2 seconds   INR 3.99 (H) 0.00 - 1.49  Glucose, capillary     Status: Abnormal   Collection Time: 10/31/15  7:03 AM  Result Value Ref Range   Glucose-Capillary 159 (H) 65 - 99 mg/dL   Basic metabolic panel     Status: Abnormal   Collection Time: 10/31/15  8:30 AM  Result Value Ref Range   Sodium 142 135 - 145 mmol/L   Potassium 3.8 3.5 - 5.1 mmol/L   Chloride 104 101 - 111 mmol/L   CO2 25 22 - 32 mmol/L   Glucose, Bld 180 (H) 65 - 99 mg/dL   BUN 28 (H) 6 -  20 mg/dL   Creatinine, Ser 1.94 (H) 0.44 - 1.00 mg/dL   Calcium 9.2 8.9 - 10.3 mg/dL   GFR calc non Af Amer 23 (L) >60 mL/min   GFR calc Af Amer 26 (L) >60 mL/min    Comment: (NOTE) The eGFR has been calculated using the CKD EPI equation. This calculation has not been validated in all clinical situations. eGFR's persistently <60 mL/min signify possible Chronic Kidney Disease.    Anion gap 13 5 - 15  Glucose, capillary     Status: Abnormal   Collection Time: 10/31/15 11:25 AM  Result Value Ref Range   Glucose-Capillary 178 (H) 65 - 99 mg/dL   Comment 1 Notify RN   Glucose, capillary     Status: Abnormal   Collection Time: 10/31/15  4:13 PM  Result Value Ref Range   Glucose-Capillary 145 (H) 65 - 99 mg/dL   Comment 1 Notify RN   Glucose, capillary     Status: Abnormal   Collection Time: 10/31/15  8:57 PM  Result Value Ref Range   Glucose-Capillary 199 (H) 65 - 99 mg/dL   Comment 1 Notify RN    Comment 2 Document in Chart   Protime-INR     Status: Abnormal   Collection Time: 11/01/15  4:59 AM  Result Value Ref Range   Prothrombin Time 31.3 (H) 11.6 - 15.2 seconds   INR 3.09 (H) 0.00 - 1.49  Comprehensive metabolic panel     Status: Abnormal   Collection Time: 11/01/15  4:59 AM  Result Value Ref Range   Sodium 140 135 - 145 mmol/L   Potassium 4.1 3.5 - 5.1 mmol/L   Chloride 106 101 - 111 mmol/L   CO2 25 22 - 32 mmol/L   Glucose, Bld 174 (H) 65 - 99 mg/dL   BUN 26 (H) 6 - 20 mg/dL   Creatinine, Ser 2.00 (H) 0.44 - 1.00 mg/dL   Calcium 9.1 8.9 - 10.3 mg/dL   Total Protein 5.4 (L) 6.5 - 8.1 g/dL   Albumin 3.3 (L) 3.5 - 5.0 g/dL   AST 17 15 - 41 U/L   ALT 14 14 - 54 U/L   Alkaline Phosphatase 63  38 - 126 U/L   Total Bilirubin 0.8 0.3 - 1.2 mg/dL   GFR calc non Af Amer 22 (L) >60 mL/min   GFR calc Af Amer 25 (L) >60 mL/min    Comment: (NOTE) The eGFR has been calculated using the CKD EPI equation. This calculation has not been validated in all clinical situations. eGFR's persistently <60 mL/min signify possible Chronic Kidney Disease.    Anion gap 9 5 - 15  CBC     Status: Abnormal   Collection Time: 11/01/15  4:59 AM  Result Value Ref Range   WBC 4.6 4.0 - 10.5 K/uL   RBC 3.73 (L) 3.87 - 5.11 MIL/uL   Hemoglobin 10.5 (L) 12.0 - 15.0 g/dL   HCT 31.7 (L) 36.0 - 46.0 %   MCV 85.0 78.0 - 100.0 fL   MCH 28.2 26.0 - 34.0 pg   MCHC 33.1 30.0 - 36.0 g/dL   RDW 14.1 11.5 - 15.5 %   Platelets 219 150 - 400 K/uL  Glucose, capillary     Status: Abnormal   Collection Time: 11/01/15  6:16 AM  Result Value Ref Range   Glucose-Capillary 150 (H) 65 - 99 mg/dL     Lipid Panel  No results found for: CHOL, TRIG, HDL, CHOLHDL, VLDL, LDLCALC, LDLDIRECT  Lab Results  Component Value Date   HGBA1C 5.8* 12/04/2014     Lab Results  Component Value Date   CREATININE 2.00* 11/01/2015     HPI 81 y.o. female with a past medical history significant for NIDDM, HTN, COPD, and persistent A. fib on warfarin who failed cardioversion on Monday and started on Tuesday on amiodarone who presents with worsening shortness of breath over the last week.  The patient has had long-standing paroxysmal atrial fibrillation recently found to be persistently in A. fib with rapid rate. She underwent failed DC cardioversion by Dr. Debara Pickett on Monday of this week and was started on Tuesday on oral amiodarone loading. She and her daughter now report that although she has been dyspneic since her persistent rapid A. fib started 2 weeks ago, in the last 5 days this has gotten progressively and persistently worse to the point that tonight she felt severely short of breath, diaphoretic, and with chest heaviness. She  has not had increased leg swelling or weight gain.  In the ED, the patient was OVANVBTYOMAY,045 systolic , tachypneic, and had elevated BNP. Initial troponin was negative. Chest x-ray showed pulmonary edema and the patient was given furosemide 40 mg IV and put on BiPAP with improvement. A nitroglycerin drip was started for hypertensive urgency.    HOSPITAL COURSE:   1. Acute on chronic diastolic HF in the setting hypertensive urgency -  - bibasilar fine crackle on exam, no obvious LE edema, mild JVD,  diuresied with   IV lasix, received 71m IV lasix BID , Echo 10/31/2015 EF 60-65%, no RWMA, grade 2 diastolic dysfunction, moderate MR.  - appears to be euvolemic,   transitioned to PO lasix. Weigh down from 153 lbs to 149 lbs. Was on 468mPO lasix at home,   changed to 8071mO lasix (as she was on 82m24mD IV lasix with only 1700 urine output on a daily basis). Likely can be discharged, cards will arrange 7 day TOC followup and BMET on the day of followup.   2. Hypertensive urgency - increased clonidine,cont  hydralazine, diltiazem and labetalol. Per pt, has chronic problem managing BP.   3. PAF on coumadin - converted back to NSR after loading with amio as outpatient - amiodarone decreased down to 400mg5mly on 10/31. Per office note on 10/25, plan decrease amiodarone to 100mg 58m weeks (11/8)  On coumadin , supra therapeutic, needs close INR monitoring at coumadin clinic     4. Anemia of chronic disease and iron def.:  Stable. -Continue home iron. -Trend CBC  5. NIDDM: Stable Stable. Last HgbA1c in 2015 5.8%. -Hold home sulfonylurea -Correction insulin with meals if needed  6. COPD:  Stable.  -Continue home fluticasone and desloratadine  7. GERD:  Stable.  -Continue home PPI   Discharge Exam:  Blood pressure 148/54, pulse 62, temperature 97.9 F (36.6 C), temperature source Oral, resp. rate 18,  height '5\' 4"'  (1.626 m), weight 67.903 kg (149 lb 11.2 oz), SpO2 95 %.  Gen: Well-appearing. NAD. Neck: JVP 2 cm above clavicle at 45 degrees COR: RRR. No m/r/g. Nl S1/S2  Lungs: Bibasilar crackles, no rhonchi or wheezes. Abd: Soft, NT, ND. +BS Ext WWP. No edema     Follow-up Information    Follow up with WONG,FAnthoney HaradaSchedule an appointment as soon as possible for a visit in 1 week.   Specialty:  Family Medicine   Contact information:   [address] [phone]  Follow up with Sharol Harness, MD. Schedule an appointment as soon as possible for a visit in 1 week.   Specialty:  Cardiology   Contact information:   [address] [place] [address] [phone]       Signed: Reyne Dumas 11/01/2015, 12:53 PM        Time spent >45 mins

## 2015-11-01 NOTE — Progress Notes (Signed)
I stopped back in to reinforce HF education with Ariel Avila.  She is scheduled to discharge today.  We spoke briefly about her daily weights and when to contact the physician.  She can teach back to call when weight is up 3 lbs overnight and 5 lbs in 5 days.  She admits that she was eating some wrong foods and "got into trouble' because of excess sodium.   I reinforced the need for a low sodium diet and limiting sodium intake to 2000 mg per day.  She denies any issues getting or taking prescribed medications.  She has excellent support at home as her daughter works here at [employer]. Ariel Avila doesn't "like to bother" her.  I have encouraged her to call me back with any concerns or questions related to her HF.

## 2015-11-01 NOTE — Discharge Instructions (Signed)
Heart failure prevention:  1. Limit amount salt intake 2. Limit daily fluid intake to <2L 3. Weigh yourself every morning, call cardiology if weight increase by more than 3 lbs overnight or 5 lbs in a single week

## 2015-11-01 NOTE — Progress Notes (Signed)
Patient Name: Ariel Avila Date of Encounter: 11/01/2015  Primary Cardiologist: Dr. Tamala Julian   Active Problems:   Atrial fibrillation [facility])   Essential hypertension   Hyperlipidemia   Diabetes mellitus type 2, uncontrolled, with complications ([place])   Hyperaldosteronism (HCC)   Chronic diastolic heart failure (HCC)   CKD (chronic kidney disease) stage 3, GFR 30-59 ml/min   Acute on chronic diastolic heart failure ([place])    SUBJECTIVE  Denies any CP or SOB. Slept great last night.   CURRENT MEDS . amiodarone  400 mg Oral Daily  . cloNIDine  0.2 mg Oral BID  . diltiazem  180 mg Oral Daily  . ferrous sulfate  325 mg Oral BID WC  . furosemide  40 mg Intravenous Once  . hydrALAZINE  100 mg Oral TID  . insulin aspart  0-5 Units Subcutaneous QHS  . insulin aspart  0-9 Units Subcutaneous TID WC  . labetalol  300 mg Oral BID  . pantoprazole  40 mg Oral Daily  . potassium chloride  20 mEq Oral BID  . pravastatin  20 mg Oral q1800  . Warfarin - Pharmacist Dosing Inpatient   Does not apply q1800    OBJECTIVE  Filed Vitals:   10/31/15 1700 10/31/15 1837 10/31/15 2100 11/01/15 0445  BP: 130/60  157/58 146/61  Pulse: 68  70 71  Temp:   98.1 F (36.7 C) 98.3 F (36.8 C)  TempSrc:   Oral Oral  Resp:   20 19  Height:      Weight:    149 lb 11.2 oz (67.903 kg)  SpO2: 97% 96% 94% 94%    Intake/Output Summary (Last 24 hours) at 11/01/15 0932 Last data filed at 11/01/15 0929  Gross per 24 hour  Intake   1920 ml  Output   1550 ml  Net    370 ml   Filed Weights   10/30/15 1549 10/31/15 0350 11/01/15 0445  Weight: 150 lb 6.4 oz (68.221 kg) 150 lb 9.6 oz (68.312 kg) 149 lb 11.2 oz (67.903 kg)    PHYSICAL EXAM  General: Pleasant, NAD. Neuro: Alert and oriented X 3. Moves all extremities spontaneously. Psych: Normal affect. HEENT:  Normal  Neck: Supple without bruits  Lungs:  Resp regular and unlabored. Diminished breath sound in bilateral bases Heart: RRR no s3, s4, or  murmurs. Abdomen: Soft, non-tender, non-distended, BS + x 4.  Extremities: No clubbing, cyanosis or edema. DP/PT/Radials 2+ and equal bilaterally.  Accessory Clinical Findings  CBC  Recent Labs  10/29/15 1806 10/30/15 0335 11/01/15 0459  WBC 7.3 5.2 4.6  NEUTROABS 6.0  --   --   HGB 11.1* 10.1* 10.5*  HCT 33.4* 30.8* 31.7*  MCV 84.3 84.4 85.0  PLT 196 193 443   Basic Metabolic Panel  Recent Labs  10/31/15 0830 11/01/15 0459  NA 142 140  K 3.8 4.1  CL 104 106  CO2 25 25  GLUCOSE 180* 174*  BUN 28* 26*  CREATININE 1.94* 2.00*  CALCIUM 9.2 9.1   Cardiac Enzymes  Recent Labs  10/29/15 2240 10/30/15 0335 10/30/15 1125  TROPONINI 0.03 <0.03 <0.03   TELE NSR with HR 70s, borderline 1st degree AV block    ECG  No new EKG  Echocardiogram 10/31/2015  LV EF: 60% -  65%  ------------------------------------------------------------------- Indications:   Atrial fibrillation - 427.31.  ------------------------------------------------------------------- History:  Risk factors: Hypertension. Diabetes mellitus. Dyslipidemia.  ------------------------------------------------------------------- Study Conclusions  - Left ventricle: The cavity size was normal.  Wall thickness was normal. Systolic function was normal. The estimated ejection fraction was in the range of 60% to 65%. Wall motion was normal; there were no regional wall motion abnormalities. Features are consistent with a pseudonormal left ventricular filling pattern, with concomitant abnormal relaxation and increased filling pressure (grade 2 diastolic dysfunction). - Mitral valve: There was moderate regurgitation. - Left atrium: The atrium was mildly dilated. - Pericardium, extracardiac: A trivial pericardial effusion was identified.    Radiology/Studies  Dg Chest Port 1 View  10/30/2015  CLINICAL DATA:  Evaluate for pulmonary edema EXAM: PORTABLE CHEST 1 VIEW COMPARISON:   10/29/2015 FINDINGS: The heart size appears mildly enlarged. There is aortic atherosclerosis. No airspace consolidation identified. Bilateral pleural effusions are identified. There has been mild improvement in interstitial edema. Persistent bibasilar atelectasis. IMPRESSION: 1. Mild CHF with slight improvement in interstitial edema pattern. Electronically Signed   By: Kerby Moors M.D.   On: 10/30/2015 11:59   Dg Chest Portable 1 View  10/29/2015  CLINICAL DATA:  81 year old female with a history of shortness of breath and chest pain. EXAM: PORTABLE CHEST 1 VIEW COMPARISON:  06/23/2014 FINDINGS: Cardiomediastinal silhouette unchanged, with cardiomegaly. Atherosclerosis. Fullness in the central vasculature with indistinct margins. Interstitial opacities with interlobular septal thickening. Blunting of the bilateral costophrenic angles. No pneumothorax. IMPRESSION: Evidenced of developing CHF, with interstitial edema and likely small pleural effusions and atelectasis. Atherosclerosis. Signed, Dulcy Fanny. Earleen Newport, DO [facility] Radiology Electronically Signed   By: Corrie Mckusick D.O.   On: 10/29/2015 18:26    ASSESSMENT AND PLAN  81 yo female with PMH of PAF on coumadin, chronic diastolic HF, CKD who recently went back into afib and was loaded on amio as outpatient in preparation for DCCV came in with increasing SOB, hypertensive urgency and found to have pulm edema.   1. Acute on chronic diastolic HF in the setting hypertensive urgency  - per overnight cardiology fellow's note, still SOB esp with ambulation  - bibasilar fine crackle on exam, no obvious LE edema, mild JVD, will continue IV lasix, received 40mg  IV lasix this morning, will check BMET, ordered additional 40mg  IV lasix this afternoon  - will monitor for any sign of amio toxicity. If SOB gets worse despite lasix, may need to hold amio.  - Echo 10/31/2015 EF 60-65%, no RWMA, grade 2 diastolic  dysfunction, moderate MR.   - appears to be euvolemic, will transition to PO lasix. Weigh down from 153 lbs to 149 lbs. Was on 40mg  PO lasix at home, will change to 80mg  PO lasix (as she was on 40mg  BID IV lasix with only 1700 urine output on a daily basis). Likely can be discharged, will arrange 7 day TOC followup and BMET on the day of followup.   2. Hypertensive urgency  - continue clonidine, hydralazine, diltiazem and labetalol. Per pt, has chronic problem managing BP.   3. PAF on coumadin  - converted back to NSR after loading with amio as outpatient  - amiodarone decreased down to 400mg  daily on 10/31. Per office note on 10/25, plan decrease amiodarone to 100mg  in 2 weeks (11/8)   4. CKD stage IV 5. HTN 6. HLD 7. DM II  Signed, Almyra Deforest PA-C Pager: [phone]

## 2015-11-01 NOTE — Evaluation (Signed)
Physical Therapy Evaluation Patient Details Name: Ariel Avila MRN: [id number] DOB: 11-Jul-1930 Today's Date: 11/01/2015   History of Present Illness  81 y.o. female with a past medical history significant for NIDDM, HTN, COPD, and persistent A. fib on warfarin who failed cardioversion on Monday and started on Tuesday on amiodarone who presents with worsening shortness of breath over the last week.  Clinical Impression  Pt was independent with mobility. Pt presented with diminished Light touch to R heel and had adequate stepping response to avoid LOB in tandem stance. No PT follow up recommended due to current activity level.    Follow Up Recommendations No PT follow up    Equipment Recommendations  None recommended by PT    Recommendations for Other Services       Precautions / Restrictions Precautions Precautions: Fall Restrictions Weight Bearing Restrictions: No      Mobility  Bed Mobility                  Transfers Overall transfer level: Independent                  Ambulation/Gait             General Gait Details: Patient was just finishing ambulating with nurse prior to evaluation  Stairs            Wheelchair Mobility    Modified Rankin (Stroke Patients Only)       Balance Overall balance assessment: Needs assistance   Sitting balance-Leahy Scale: Normal     Standing balance support: No upper extremity supported Standing balance-Leahy Scale: Good       Tandem Stance - Right Leg: 2 (Patient presented with positive stepping response) Tandem Stance - Left Leg: 10 [place] - Eyes Opened: 30 [place] - Eyes Closed: 30                 Pertinent Vitals/Pain      Home Living Family/patient expects to be discharged to:: Private residence Living Arrangements: Spouse/significant other   Type of Home: House       Home Layout: One level Home Equipment: None      Prior Function Level of Independence: Independent                Hand Dominance        Extremity/Trunk Assessment               Lower Extremity Assessment: RLE deficits/detail;LLE deficits/detail RLE Deficits / Details: global 5/5, Knee flex 4/5, DF 4/5, proprioception and kinesthesia intact LLE Deficits / Details: See RLE strength, proprioception and kinesthesia intact     Communication   Communication: No difficulties  Cognition Arousal/Alertness: Awake/alert Behavior During Therapy: WFL for tasks assessed/performed Overall Cognitive Status: Within Functional Limits for tasks assessed                      General Comments      Exercises        Assessment/Plan    PT Assessment Patent does not need any further PT services  PT Diagnosis Generalized weakness   PT Problem List    PT Treatment Interventions     PT Goals (Current goals can be found in the Care Plan section) Acute Rehab PT Goals PT Goal Formulation: All assessment and education complete, DC therapy    Frequency     Barriers to discharge        Co-evaluation  End of Session   Activity Tolerance: Patient tolerated treatment well             Time: 0900-0920 PT Time Calculation (min) (ACUTE ONLY): 20 min   Charges:   PT Evaluation $Initial PT Evaluation Tier I: 1 Procedure PT Treatments $Therapeutic Activity: 8-22 mins   PT G Codes:        Kenzi Bardwell,CYNDI 11-15-2015, 12:57 PM Magda Kiel, [place] 11/15/15 Patient seen and note written in collaboration with Lyanne Co, SPT

## 2015-11-02 ENCOUNTER — Telehealth: Payer: Self-pay | Admitting: Interventional Cardiology

## 2015-11-02 NOTE — Telephone Encounter (Signed)
If Home Health RN is calling please get Coumadin Nurse on the phone STAT  1.  Are you calling in regards to an appointment? No  2.  Are you calling for a refill ? No  3.  Are you having bleeding issues? No  4.  Do you need clearance to hold Coumadin? No  Pt calling stating that she just got out of the hospital and wasn't given guidance on whether to take her Coumadin today or not. Please call back.

## 2015-11-02 NOTE — Telephone Encounter (Signed)
Spoke with pt.  She was admitted from 10/29-11/1.  Her INR was 3.1 yesterday.  She hed her dose yseterday.  Will have her take her normal dose today and tomorrow.  She has an appt for follow up INR on Friday 11/4.

## 2015-11-03 ENCOUNTER — Other Ambulatory Visit (HOSPITAL_COMMUNITY): Payer: Medicare Other

## 2015-11-03 ENCOUNTER — Telehealth (HOSPITAL_COMMUNITY): Payer: Self-pay | Admitting: Surgery

## 2015-11-03 ENCOUNTER — Telehealth: Payer: Self-pay | Admitting: Interventional Cardiology

## 2015-11-03 NOTE — Telephone Encounter (Signed)
Returned pt daughter call. She sts that pt has been doing ok since d/c from the hospital. Pt heart rate has been in the 50's, blood pressure has been good, and pt is asymptomatic. Sonda Rumble pt should f/u with Dr.Smith as planned, they should call sooner if symptoms develop. [name] agreed with plan and verbalized understanding. Per Ellen Henri, PA h &p on 10/25. Pt was to have a repeat EKG to recheck her QT. Nurse EKG appt scheduled for 11/4 @ 11am same day as pt cvrr appt. [name] aware of nurse visit appt, she will be bringing the pt tomorrow

## 2015-11-03 NOTE — Telephone Encounter (Signed)
Heart Failure Nurse Navigator Post Discharge Telephone Call  I spoke with Ariel Avila regarding her recent hospitalization.  She says that she is feeling "much better".  She is weighing each day and weight today was 147 lbs versus 149.7 on date of discharge.  She has been avoiding salty snacks and high sodium foods.  She denies any issues with getting or taking prescribed medications.  She has a follow up with her PCP next week and with Dr. Tamala Julian on 11/15/15.  I have encouraged her to call me back with any concerns or questions related to HF.

## 2015-11-03 NOTE — Telephone Encounter (Signed)
New message     Pt was discharged from  hosp recently for CHF.  Her HR is dropping to the 50's. Pt does not have any symptoms.  Daughter want Dr Tamala Julian to look at hosp notes and call her and let her know if any medication changes need to take place

## 2015-11-04 ENCOUNTER — Ambulatory Visit (INDEPENDENT_AMBULATORY_CARE_PROVIDER_SITE_OTHER): Payer: Medicare Other

## 2015-11-04 ENCOUNTER — Ambulatory Visit (INDEPENDENT_AMBULATORY_CARE_PROVIDER_SITE_OTHER): Payer: Medicare Other | Admitting: *Deleted

## 2015-11-04 DIAGNOSIS — I480 Paroxysmal atrial fibrillation: Secondary | ICD-10-CM

## 2015-11-04 DIAGNOSIS — Z5181 Encounter for therapeutic drug level monitoring: Secondary | ICD-10-CM

## 2015-11-04 DIAGNOSIS — I4891 Unspecified atrial fibrillation: Secondary | ICD-10-CM | POA: Diagnosis not present

## 2015-11-04 LAB — POCT INR: INR: 2

## 2015-11-04 MED ORDER — AMIODARONE HCL 200 MG PO TABS: 200.0000 mg | ORAL_TABLET | Freq: Every day | ORAL | Status: DC

## 2015-11-04 NOTE — Progress Notes (Signed)
The pt arrives in the office for an EKG per her hosp d/c instructions on 10/31.  Ekg obtained and given to Dr Caryl Comes for review. Per Dr Caryl Comes the pt is advised to decrease her Amiodarone to 200 mg daily and to keep her f/u appt with Dr Tamala Julian on 11/15.  She verbalized understanding.

## 2015-11-07 DIAGNOSIS — K449 Diaphragmatic hernia without obstruction or gangrene: Secondary | ICD-10-CM | POA: Diagnosis not present

## 2015-11-07 DIAGNOSIS — I10 Essential (primary) hypertension: Secondary | ICD-10-CM | POA: Diagnosis not present

## 2015-11-07 DIAGNOSIS — D649 Anemia, unspecified: Secondary | ICD-10-CM | POA: Diagnosis not present

## 2015-11-07 DIAGNOSIS — N189 Chronic kidney disease, unspecified: Secondary | ICD-10-CM | POA: Diagnosis not present

## 2015-11-07 DIAGNOSIS — I5032 Chronic diastolic (congestive) heart failure: Secondary | ICD-10-CM | POA: Diagnosis not present

## 2015-11-07 DIAGNOSIS — E119 Type 2 diabetes mellitus without complications: Secondary | ICD-10-CM | POA: Diagnosis not present

## 2015-11-07 DIAGNOSIS — Z7901 Long term (current) use of anticoagulants: Secondary | ICD-10-CM | POA: Diagnosis not present

## 2015-11-07 DIAGNOSIS — I482 Chronic atrial fibrillation: Secondary | ICD-10-CM | POA: Diagnosis not present

## 2015-11-07 DIAGNOSIS — E785 Hyperlipidemia, unspecified: Secondary | ICD-10-CM | POA: Diagnosis not present

## 2015-11-08 ENCOUNTER — Other Ambulatory Visit: Payer: Medicare Other

## 2015-11-11 ENCOUNTER — Ambulatory Visit (INDEPENDENT_AMBULATORY_CARE_PROVIDER_SITE_OTHER): Payer: Medicare Other | Admitting: *Deleted

## 2015-11-11 DIAGNOSIS — Z5181 Encounter for therapeutic drug level monitoring: Secondary | ICD-10-CM

## 2015-11-11 DIAGNOSIS — I480 Paroxysmal atrial fibrillation: Secondary | ICD-10-CM | POA: Diagnosis not present

## 2015-11-11 DIAGNOSIS — I4891 Unspecified atrial fibrillation: Secondary | ICD-10-CM | POA: Diagnosis not present

## 2015-11-11 LAB — POCT INR: INR: 4.3

## 2015-11-15 ENCOUNTER — Ambulatory Visit (INDEPENDENT_AMBULATORY_CARE_PROVIDER_SITE_OTHER): Payer: Medicare Other | Admitting: Interventional Cardiology

## 2015-11-15 ENCOUNTER — Encounter: Payer: Self-pay | Admitting: Interventional Cardiology

## 2015-11-15 VITALS — BP 126/54 | HR 58 | Ht 64.0 in | Wt 148.4 lb

## 2015-11-15 DIAGNOSIS — Z5181 Encounter for therapeutic drug level monitoring: Secondary | ICD-10-CM | POA: Diagnosis not present

## 2015-11-15 DIAGNOSIS — I5032 Chronic diastolic (congestive) heart failure: Secondary | ICD-10-CM

## 2015-11-15 DIAGNOSIS — I480 Paroxysmal atrial fibrillation: Secondary | ICD-10-CM

## 2015-11-15 DIAGNOSIS — I10 Essential (primary) hypertension: Secondary | ICD-10-CM

## 2015-11-15 NOTE — Patient Instructions (Signed)
Medication Instructions:  Your physician has recommended you make the following change in your medication:  REDUCE Lasix to alternate 40mg  one day and 80mg  the next   Labwork: None ordered  Testing/Procedures: None ordered  Follow-Up: Your physician recommends that you schedule a follow-up appointment in: 3 months   Any Other Special Instructions Will Be Listed Below (If Applicable).     If you need a refill on your cardiac medications before your next appointment, please call your pharmacy.

## 2015-11-15 NOTE — Progress Notes (Signed)
Cardiology Office Note   Date:  11/15/2015   ID:  JHORDYN ZAHRT, DOB [birth year], MRN [id number]  PCP:  Anthoney Harada, MD  Cardiologist:  Sinclair Grooms, MD   Chief Complaint  Patient presents with  . Atrial Fibrillation  . Congestive Heart Failure      History of Present Illness: Ariel Avila is a 81 y.o. female who presents for  Paroxysmal atrial fibrillation , electrical cardioversion October [id number], acute diastolic heart failure post-cardioversion, type 2 diabetes, stage III chronic kidney disease, essential hypertension, and possible undiagnosed underlying coronary artery disease.   Mrs. Liebelt has had a tough 2-3 week time frame. She was seen in October in atrial fibrillation. Onset was unknown. They have been progressive fatigue and dyspnea over several months. Anticoagulation state therapy was started as was amiodarone. She eventually underwent elective electrocardioversion. Within 48 hours she was readmitted to the hospital in respiratory failure related to acute on chronic diastolic heart failure. Diuresis led to dramatic brisk improvement. Since discharge from the hospital she has done well. Her energy level is stable. She denies anginal quality chest pain. Her appetite is improved. There is no orthopnea or PND.   No medication side effects including the absence of bleeding on anticoagulation.   Most recent creatinine is greater than 2.0.  Past Medical History  Diagnosis Date  . Hypertension   . Allergy   . GERD (gastroesophageal reflux disease)   . Hyperlipidemia   . Osteoporosis   . Chronic bronchitis ([place])     "lately" (06/23/2014)  . Type II diabetes mellitus ([place])   . H/O hiatal hernia   . Arthritis     "fingers and back" (06/23/2014)  . Chronic kidney disease (CKD), stage III (moderate)   . Basal cell carcinoma   . Paroxysmal a-fib ([place])   . Dysrhythmia   . CHF (congestive heart failure) [facility])     Past Surgical History  Procedure Laterality  Date  . Abdominal hysterectomy    . Appendectomy    . Basal cell carcinoma excision      "face and nose"  . Cardioversion N/A 10/24/2015    Procedure: CARDIOVERSION;  Surgeon: Pixie Casino, MD;  Location: [facility] ENDOSCOPY;  Service: Cardiovascular;  Laterality: N/A;     Current Outpatient Prescriptions  Medication Sig Dispense Refill  . amiodarone (PACERONE) 200 MG tablet Take 1 tablet (200 mg total) by mouth daily. 30 tablet 1  . cloNIDine (CATAPRES) 0.2 MG tablet Take 1 tablet (0.2 mg total) by mouth 2 (two) times daily. 60 tablet 0  . desloratadine (CLARINEX) 5 MG tablet Take 5 mg by mouth daily.    Marland Kitchen diltiazem (CARDIZEM CD) 180 MG 24 hr capsule Take 180 mg by mouth daily.    . ferrous sulfate 325 (65 FE) MG tablet Take 325 mg by mouth 2 (two) times daily.     . fluticasone (FLONASE) 50 MCG/ACT nasal spray Place 1 spray into both nostrils daily as needed for allergies or rhinitis.     . furosemide (LASIX) 80 MG tablet Take 1 tablet (80 mg total) by mouth daily. 30 tablet 1  . glimepiride (AMARYL) 1 MG tablet Take 1 tablet (1 mg total) by mouth daily with breakfast. 30 tablet 1  . hydrALAZINE (APRESOLINE) 50 MG tablet Take 100 mg by mouth 3 (three) times daily.     Marland Kitchen labetalol (NORMODYNE) 300 MG tablet Take 300 mg by mouth 2 (two) times daily.    Marland Kitchen lovastatin (MEVACOR)  40 MG tablet Take 1 tablet (40 mg total) by mouth at bedtime. 30 tablet 10  . Multiple Vitamin (MULTIVITAMIN WITH MINERALS) TABS tablet Take 1 tablet by mouth daily. One a Day Women's    . Multiple Vitamins-Minerals (HAIR/SKIN/NAILS/BIOTIN) TABS Take 1 tablet by mouth daily.    . pantoprazole (PROTONIX) 40 MG tablet Take 40 mg by mouth daily.    . Polyvinyl Alcohol-Povidone (REFRESH OP) Place 1 drop into both eyes 2 (two) times daily as needed (for dry eyes).    . potassium chloride SA (K-DUR,KLOR-CON) 20 MEQ tablet Take 1 tablet (20 mEq total) by mouth 2 (two) times daily. 60 tablet 1  . vitamin A 8000 UNIT capsule Take  8,000 Units by mouth daily.    Marland Kitchen warfarin (COUMADIN) 2 MG tablet TAKE AS DIRECTED BY  COUMADIN  CLINIC 75 tablet 3   No current facility-administered medications for this visit.    Allergies:   Levaquin and Sulfa antibiotics    Social History:  The patient  reports that she has never smoked. She has never used smokeless tobacco. She reports that she does not drink alcohol or use illicit drugs.   Family History:  The patient's family history includes Heart attack in her brother, father, and paternal grandmother; Heart disease in her brother and father; Tuberculosis in her mother. There is no history of Hypertension or Stroke.    ROS:  Please see the history of present illness.   Otherwise, review of systems are positive for  Anxiety concerning her overall cardiac condition. mild persistent shortness of breath but dramatically improved..   All other systems are reviewed and negative.    PHYSICAL EXAM: VS:  BP 126/54 mmHg  Pulse 58  Ht 5\' 4"  (1.626 m)  Wt 67.314 kg (148 lb 6.4 oz)  BMI 25.46 kg/m2 , BMI Body mass index is 25.46 kg/(m^2). GEN: Well nourished, well developed, in no acute distress HEENT: normal Neck: no JVD, carotid bruits, or masses Cardiac: RRR.  There is no murmur, rub, or gallop. There is  no edema. Respiratory:  clear to auscultation bilaterally, normal work of breathing. GI: soft, nontender, nondistended, + BS MS: no deformity or atrophy Skin: warm and dry, no rash Neuro:  Strength and sensation are intact Psych: euthymic mood, full affect   EKG:  EKG  is ordered today. The ekg reveals  Sinus bradycardia, poor R-wave progression, leftward axis , otherwise unremarkable with the exception of first grade AV block. QTC is upper normal in duration.   Recent Labs: 10/25/2015: TSH 3.311 10/29/2015: B Natriuretic Peptide 429.7* 11/01/2015: ALT 14; BUN 26*; Creatinine, Ser 2.00*; Hemoglobin 10.5*; Platelets 219; Potassium 4.1; Sodium 140    Lipid Panel No results  found for: CHOL, TRIG, HDL, CHOLHDL, VLDL, LDLCALC, LDLDIRECT    Wt Readings from Last 3 Encounters:  11/15/15 67.314 kg (148 lb 6.4 oz)  11/01/15 67.903 kg (149 lb 11.2 oz)  10/25/15 69.4 kg (153 lb)      Other studies Reviewed: Additional studies/ records that were reviewed today include:  Medical record notes concerning the recent cardioversion and hospital stay were reviewed.. The findings include  Data as already described above in the note..    ASSESSMENT AND PLAN:  1. Paroxysmal atrial fibrillation (HCC)  maintaining rhythm on amiodarone. She is now on 200 mg per day. - EKG 12-Lead  2. Essential hypertension  excellent blood pressure control this morning - EKG 12-Lead  3. Chronic diastolic heart failure (HCC)  no evidence of  volume excess on today's exam - EKG 12-Lead  4. Encounter for therapeutic drug monitoring  no bleeding on anticoagulation therapy. Also no complaints to suggest the possibility of amiodarone toxicity.    Current medicines are reviewed at length with the patient today.  The patient has the following concerns regarding medicines:  Concern about whether or not her current diuretic regimen could be worsening underlying kidney function..  The following changes/actions have been instituted:     decrease furosemide to 80 mg alternated with 40 mg daily. If no increase in weight or dyspnea, will likely decrease to 40 mg per day.   Continue amiodarone at the current dose   Continue current anticoagulation regimen   We discussed the potential risk of long-term amiodarone therapy including thyroid, liver, and lung.   TSH and hepatic panel in early 2017.  Labs/ tests ordered today include:   Orders Placed This Encounter  Procedures  . EKG 12-Lead     Disposition:   FU with HS in 2 months  Signed, Sinclair Grooms, MD  11/15/2015 1:54 PM    [facility] [place], [place],   [address] Phone: [phone];  Fax: [phone]

## 2015-11-18 ENCOUNTER — Ambulatory Visit (INDEPENDENT_AMBULATORY_CARE_PROVIDER_SITE_OTHER): Payer: Medicare Other | Admitting: *Deleted

## 2015-11-18 DIAGNOSIS — I4891 Unspecified atrial fibrillation: Secondary | ICD-10-CM

## 2015-11-18 DIAGNOSIS — I480 Paroxysmal atrial fibrillation: Secondary | ICD-10-CM

## 2015-11-18 DIAGNOSIS — Z5181 Encounter for therapeutic drug level monitoring: Secondary | ICD-10-CM | POA: Diagnosis not present

## 2015-11-18 LAB — POCT INR: INR: 3.2

## 2015-11-28 ENCOUNTER — Ambulatory Visit (INDEPENDENT_AMBULATORY_CARE_PROVIDER_SITE_OTHER): Payer: Medicare Other

## 2015-11-28 DIAGNOSIS — Z5181 Encounter for therapeutic drug level monitoring: Secondary | ICD-10-CM

## 2015-11-28 DIAGNOSIS — E119 Type 2 diabetes mellitus without complications: Secondary | ICD-10-CM | POA: Diagnosis not present

## 2015-11-28 DIAGNOSIS — I482 Chronic atrial fibrillation: Secondary | ICD-10-CM | POA: Diagnosis not present

## 2015-11-28 DIAGNOSIS — E785 Hyperlipidemia, unspecified: Secondary | ICD-10-CM | POA: Diagnosis not present

## 2015-11-28 DIAGNOSIS — K449 Diaphragmatic hernia without obstruction or gangrene: Secondary | ICD-10-CM | POA: Diagnosis not present

## 2015-11-28 DIAGNOSIS — I10 Essential (primary) hypertension: Secondary | ICD-10-CM | POA: Diagnosis not present

## 2015-11-28 DIAGNOSIS — Z7901 Long term (current) use of anticoagulants: Secondary | ICD-10-CM | POA: Diagnosis not present

## 2015-11-28 DIAGNOSIS — D649 Anemia, unspecified: Secondary | ICD-10-CM | POA: Diagnosis not present

## 2015-11-28 DIAGNOSIS — I5032 Chronic diastolic (congestive) heart failure: Secondary | ICD-10-CM | POA: Diagnosis not present

## 2015-11-28 DIAGNOSIS — I4891 Unspecified atrial fibrillation: Secondary | ICD-10-CM | POA: Diagnosis not present

## 2015-11-28 DIAGNOSIS — I480 Paroxysmal atrial fibrillation: Secondary | ICD-10-CM | POA: Diagnosis not present

## 2015-11-28 LAB — POCT INR: INR: 3.4

## 2015-12-07 DIAGNOSIS — J45909 Unspecified asthma, uncomplicated: Secondary | ICD-10-CM | POA: Diagnosis not present

## 2015-12-07 DIAGNOSIS — E785 Hyperlipidemia, unspecified: Secondary | ICD-10-CM | POA: Diagnosis not present

## 2015-12-07 DIAGNOSIS — I5032 Chronic diastolic (congestive) heart failure: Secondary | ICD-10-CM | POA: Diagnosis not present

## 2015-12-07 DIAGNOSIS — N189 Chronic kidney disease, unspecified: Secondary | ICD-10-CM | POA: Diagnosis not present

## 2015-12-07 DIAGNOSIS — I4891 Unspecified atrial fibrillation: Secondary | ICD-10-CM | POA: Diagnosis not present

## 2015-12-07 DIAGNOSIS — Z7901 Long term (current) use of anticoagulants: Secondary | ICD-10-CM | POA: Diagnosis not present

## 2015-12-07 DIAGNOSIS — E119 Type 2 diabetes mellitus without complications: Secondary | ICD-10-CM | POA: Diagnosis not present

## 2015-12-07 DIAGNOSIS — K449 Diaphragmatic hernia without obstruction or gangrene: Secondary | ICD-10-CM | POA: Diagnosis not present

## 2015-12-09 ENCOUNTER — Ambulatory Visit (INDEPENDENT_AMBULATORY_CARE_PROVIDER_SITE_OTHER): Payer: Medicare Other | Admitting: *Deleted

## 2015-12-09 DIAGNOSIS — I4891 Unspecified atrial fibrillation: Secondary | ICD-10-CM

## 2015-12-09 DIAGNOSIS — Z5181 Encounter for therapeutic drug level monitoring: Secondary | ICD-10-CM | POA: Diagnosis not present

## 2015-12-09 DIAGNOSIS — I480 Paroxysmal atrial fibrillation: Secondary | ICD-10-CM | POA: Diagnosis not present

## 2015-12-09 LAB — POCT INR: INR: 2.5

## 2015-12-27 ENCOUNTER — Ambulatory Visit (INDEPENDENT_AMBULATORY_CARE_PROVIDER_SITE_OTHER): Payer: Medicare Other | Admitting: Pharmacist

## 2015-12-27 DIAGNOSIS — I4891 Unspecified atrial fibrillation: Secondary | ICD-10-CM | POA: Diagnosis not present

## 2015-12-27 DIAGNOSIS — I480 Paroxysmal atrial fibrillation: Secondary | ICD-10-CM

## 2015-12-27 DIAGNOSIS — Z5181 Encounter for therapeutic drug level monitoring: Secondary | ICD-10-CM

## 2015-12-27 LAB — POCT INR: INR: 2.3

## 2016-01-13 DIAGNOSIS — D631 Anemia in chronic kidney disease: Secondary | ICD-10-CM | POA: Diagnosis not present

## 2016-01-13 DIAGNOSIS — N2581 Secondary hyperparathyroidism of renal origin: Secondary | ICD-10-CM | POA: Diagnosis not present

## 2016-01-13 DIAGNOSIS — N183 Chronic kidney disease, stage 3 (moderate): Secondary | ICD-10-CM | POA: Diagnosis not present

## 2016-01-13 DIAGNOSIS — N189 Chronic kidney disease, unspecified: Secondary | ICD-10-CM | POA: Diagnosis not present

## 2016-01-17 ENCOUNTER — Ambulatory Visit (INDEPENDENT_AMBULATORY_CARE_PROVIDER_SITE_OTHER): Payer: Medicare Other

## 2016-01-17 DIAGNOSIS — I480 Paroxysmal atrial fibrillation: Secondary | ICD-10-CM

## 2016-01-17 DIAGNOSIS — I4891 Unspecified atrial fibrillation: Secondary | ICD-10-CM | POA: Diagnosis not present

## 2016-01-17 DIAGNOSIS — Z5181 Encounter for therapeutic drug level monitoring: Secondary | ICD-10-CM

## 2016-01-17 LAB — POCT INR: INR: 2.5

## 2016-01-24 ENCOUNTER — Other Ambulatory Visit: Payer: Self-pay | Admitting: Internal Medicine

## 2016-01-24 NOTE — Telephone Encounter (Signed)
Ariel Crome, MD at 11/15/2015 8:22 AM  amiodarone (PACERONE) 200 MG tabletTake 1 tablet (200 mg total) by mouth daily 1. Paroxysmal atrial fibrillation (HCC) maintaining rhythm on amiodarone. She is now on 200 mg per day. - EKG 12-Lead  Deliah Boston Via, LPN at [id number] PM     Status: Signed       Expand All Collapse All   The pt arrives in the office for an EKG per her hosp d/c instructions on 10/31.  Ekg obtained and given to Dr Caryl Comes for review. Per Dr Caryl Comes the pt is advised to decrease her Amiodarone to 200 mg daily and to keep her f/u appt with Dr Tamala Julian on 11/15.  She verbalized understanding.

## 2016-01-27 ENCOUNTER — Encounter (HOSPITAL_COMMUNITY): Payer: Self-pay | Admitting: Emergency Medicine

## 2016-01-27 ENCOUNTER — Inpatient Hospital Stay (HOSPITAL_COMMUNITY)
Admission: EM | Admit: 2016-01-27 | Discharge: 2016-02-02 | DRG: 391 | Disposition: A | Discharge status: Home / Self Care | Payer: Medicare Other | Attending: Internal Medicine | Admitting: Internal Medicine

## 2016-01-27 ENCOUNTER — Emergency Department (HOSPITAL_COMMUNITY): Payer: Medicare Other

## 2016-01-27 DIAGNOSIS — Z7901 Long term (current) use of anticoagulants: Secondary | ICD-10-CM

## 2016-01-27 DIAGNOSIS — I129 Hypertensive chronic kidney disease with stage 1 through stage 4 chronic kidney disease, or unspecified chronic kidney disease: Secondary | ICD-10-CM | POA: Diagnosis present

## 2016-01-27 DIAGNOSIS — A09 Infectious gastroenteritis and colitis, unspecified: Principal | ICD-10-CM | POA: Diagnosis present

## 2016-01-27 DIAGNOSIS — R109 Unspecified abdominal pain: Secondary | ICD-10-CM | POA: Diagnosis not present

## 2016-01-27 DIAGNOSIS — M1990 Unspecified osteoarthritis, unspecified site: Secondary | ICD-10-CM | POA: Diagnosis present

## 2016-01-27 DIAGNOSIS — I2510 Atherosclerotic heart disease of native coronary artery without angina pectoris: Secondary | ICD-10-CM | POA: Diagnosis present

## 2016-01-27 DIAGNOSIS — Z8249 Family history of ischemic heart disease and other diseases of the circulatory system: Secondary | ICD-10-CM

## 2016-01-27 DIAGNOSIS — D62 Acute posthemorrhagic anemia: Secondary | ICD-10-CM | POA: Diagnosis present

## 2016-01-27 DIAGNOSIS — E118 Type 2 diabetes mellitus with unspecified complications: Secondary | ICD-10-CM

## 2016-01-27 DIAGNOSIS — Z79899 Other long term (current) drug therapy: Secondary | ICD-10-CM | POA: Diagnosis not present

## 2016-01-27 DIAGNOSIS — I4581 Long QT syndrome: Secondary | ICD-10-CM | POA: Diagnosis present

## 2016-01-27 DIAGNOSIS — M810 Age-related osteoporosis without current pathological fracture: Secondary | ICD-10-CM | POA: Diagnosis present

## 2016-01-27 DIAGNOSIS — R0600 Dyspnea, unspecified: Secondary | ICD-10-CM

## 2016-01-27 DIAGNOSIS — R197 Diarrhea, unspecified: Secondary | ICD-10-CM | POA: Diagnosis not present

## 2016-01-27 DIAGNOSIS — N183 Chronic kidney disease, stage 3 unspecified: Secondary | ICD-10-CM | POA: Diagnosis present

## 2016-01-27 DIAGNOSIS — K8020 Calculus of gallbladder without cholecystitis without obstruction: Secondary | ICD-10-CM | POA: Diagnosis present

## 2016-01-27 DIAGNOSIS — Z882 Allergy status to sulfonamides status: Secondary | ICD-10-CM

## 2016-01-27 DIAGNOSIS — E1122 Type 2 diabetes mellitus with diabetic chronic kidney disease: Secondary | ICD-10-CM | POA: Diagnosis present

## 2016-01-27 DIAGNOSIS — K219 Gastro-esophageal reflux disease without esophagitis: Secondary | ICD-10-CM | POA: Diagnosis present

## 2016-01-27 DIAGNOSIS — I480 Paroxysmal atrial fibrillation: Secondary | ICD-10-CM | POA: Diagnosis present

## 2016-01-27 DIAGNOSIS — R0602 Shortness of breath: Secondary | ICD-10-CM

## 2016-01-27 DIAGNOSIS — I5033 Acute on chronic diastolic (congestive) heart failure: Secondary | ICD-10-CM | POA: Diagnosis present

## 2016-01-27 DIAGNOSIS — I509 Heart failure, unspecified: Secondary | ICD-10-CM

## 2016-01-27 DIAGNOSIS — Z9049 Acquired absence of other specified parts of digestive tract: Secondary | ICD-10-CM

## 2016-01-27 DIAGNOSIS — R188 Other ascites: Secondary | ICD-10-CM | POA: Diagnosis not present

## 2016-01-27 DIAGNOSIS — I10 Essential (primary) hypertension: Secondary | ICD-10-CM | POA: Diagnosis present

## 2016-01-27 DIAGNOSIS — E785 Hyperlipidemia, unspecified: Secondary | ICD-10-CM | POA: Diagnosis present

## 2016-01-27 DIAGNOSIS — Z881 Allergy status to other antibiotic agents status: Secondary | ICD-10-CM

## 2016-01-27 DIAGNOSIS — J42 Unspecified chronic bronchitis: Secondary | ICD-10-CM | POA: Diagnosis present

## 2016-01-27 DIAGNOSIS — R911 Solitary pulmonary nodule: Secondary | ICD-10-CM | POA: Diagnosis not present

## 2016-01-27 DIAGNOSIS — Z85828 Personal history of other malignant neoplasm of skin: Secondary | ICD-10-CM

## 2016-01-27 DIAGNOSIS — E1165 Type 2 diabetes mellitus with hyperglycemia: Secondary | ICD-10-CM | POA: Diagnosis present

## 2016-01-27 DIAGNOSIS — E876 Hypokalemia: Secondary | ICD-10-CM | POA: Diagnosis present

## 2016-01-27 DIAGNOSIS — R1084 Generalized abdominal pain: Secondary | ICD-10-CM | POA: Diagnosis not present

## 2016-01-27 DIAGNOSIS — I951 Orthostatic hypotension: Secondary | ICD-10-CM | POA: Diagnosis present

## 2016-01-27 DIAGNOSIS — Z90710 Acquired absence of both cervix and uterus: Secondary | ICD-10-CM | POA: Diagnosis not present

## 2016-01-27 DIAGNOSIS — K625 Hemorrhage of anus and rectum: Secondary | ICD-10-CM

## 2016-01-27 DIAGNOSIS — K529 Noninfective gastroenteritis and colitis, unspecified: Secondary | ICD-10-CM

## 2016-01-27 DIAGNOSIS — R0682 Tachypnea, not elsewhere classified: Secondary | ICD-10-CM

## 2016-01-27 DIAGNOSIS — E860 Dehydration: Secondary | ICD-10-CM | POA: Diagnosis present

## 2016-01-27 DIAGNOSIS — IMO0002 Reserved for concepts with insufficient information to code with codable children: Secondary | ICD-10-CM

## 2016-01-27 LAB — COMPREHENSIVE METABOLIC PANEL
ALBUMIN: 4.1 g/dL (ref 3.5–5.0)
ALT: 22 U/L (ref 14–54)
ANION GAP: 10 (ref 5–15)
AST: 22 U/L (ref 15–41)
Alkaline Phosphatase: 66 U/L (ref 38–126)
BUN: 31 mg/dL — AB (ref 6–20)
CHLORIDE: 108 mmol/L (ref 101–111)
CO2: 24 mmol/L (ref 22–32)
Calcium: 9.4 mg/dL (ref 8.9–10.3)
Creatinine, Ser: 1.8 mg/dL — ABNORMAL HIGH (ref 0.44–1.00)
GFR calc Af Amer: 29 mL/min — ABNORMAL LOW (ref >60)
GFR calc non Af Amer: 25 mL/min — ABNORMAL LOW (ref >60)
GLUCOSE: 151 mg/dL — AB (ref 65–99)
POTASSIUM: 3.4 mmol/L — AB (ref 3.5–5.1)
SODIUM: 142 mmol/L (ref 135–145)
Total Bilirubin: 0.5 mg/dL (ref 0.3–1.2)
Total Protein: 6.3 g/dL — ABNORMAL LOW (ref 6.5–8.1)

## 2016-01-27 LAB — CBC
HEMATOCRIT: 37 % (ref 36.0–46.0)
HEMOGLOBIN: 12.3 g/dL (ref 12.0–15.0)
MCH: 28 pg (ref 26.0–34.0)
MCHC: 33.2 g/dL (ref 30.0–36.0)
MCV: 84.3 fL (ref 78.0–100.0)
Platelets: 171 10*3/uL (ref 150–400)
RBC: 4.39 MIL/uL (ref 3.87–5.11)
RDW: 15.1 % (ref 11.5–15.5)
WBC: 8.5 10*3/uL (ref 4.0–10.5)

## 2016-01-27 LAB — PROTIME-INR
INR: 2.29 — AB (ref 0.00–1.49)
Prothrombin Time: 24.2 seconds — ABNORMAL HIGH (ref 11.6–15.2)

## 2016-01-27 LAB — LIPASE, BLOOD: LIPASE: 24 U/L (ref 11–51)

## 2016-01-27 LAB — TYPE AND SCREEN
ABO/RH(D): O POS
Antibody Screen: NEGATIVE

## 2016-01-27 LAB — I-STAT CG4 LACTIC ACID, ED: LACTIC ACID, VENOUS: 1.32 mmol/L (ref 0.5–2.0)

## 2016-01-27 MED ORDER — SODIUM CHLORIDE 0.9 % IV BOLUS (SEPSIS): 1000.0000 mL | Freq: Once | INTRAVENOUS | Status: DC

## 2016-01-27 MED ORDER — SODIUM CHLORIDE 0.9 % IV BOLUS (SEPSIS)
500.0000 mL | Freq: Once | INTRAVENOUS | Status: AC
  Administered 2016-01-27: 500 mL via INTRAVENOUS

## 2016-01-27 NOTE — ED Notes (Signed)
Pt presents via EMS from home c/o abdominal pain rated 8/10 and diarrhea with blood clots this morning.  She denies other symptoms.  A/O x 4, no acute distress. Vitals WNL en route and on arrival.

## 2016-01-27 NOTE — ED Provider Notes (Signed)
CSN: [id number]     Arrival date & time 01/27/16  2142 History   First MD Initiated Contact with Patient 01/27/16 2207     Chief Complaint  Patient presents with  . Abdominal Pain  . Diarrhea     (Consider location/radiation/quality/duration/timing/severity/associated sxs/prior Treatment) HPI Comments: Diarrhea starting 7AM, all day, greater than 10 episodes Initially diarrhea, then around 5 started to have blood only, no stool, blood clots On coumadin Lightheaded, near-syncopal episode when walking from bathroom No CP/SOB No nausea/vomiting Lower abdominal pain, cramping, severe, coming and going, burnign, was severe this AM, now improved No fever   Patient is a 82 y.o. female presenting with abdominal pain and diarrhea.  Abdominal Pain Associated symptoms: diarrhea and nausea   Associated symptoms: no chest pain, no cough, no fever, no shortness of breath, no sore throat and no vomiting   Diarrhea Associated symptoms: abdominal pain   Associated symptoms: no fever, no headaches and no vomiting     Past Medical History  Diagnosis Date  . Hypertension   . Allergy   . GERD (gastroesophageal reflux disease)   . Hyperlipidemia   . Osteoporosis   . Chronic bronchitis ([place])     "lately" (06/23/2014)  . Type II diabetes mellitus ([place])   . H/O hiatal hernia   . Arthritis     "fingers and back" (06/23/2014)  . Chronic kidney disease (CKD), stage III (moderate)   . Basal cell carcinoma   . Paroxysmal a-fib ([place])   . Dysrhythmia   . CHF (congestive heart failure) [facility])    Past Surgical History  Procedure Laterality Date  . Abdominal hysterectomy    . Appendectomy    . Basal cell carcinoma excision      "face and nose"  . Cardioversion N/A 10/24/2015    Procedure: CARDIOVERSION;  Surgeon: Pixie Casino, MD;  Location[facility] ENDOSCOPY;  Service: Cardiovascular;  Laterality: N/A;   Family History  Problem Relation Age of Onset  . Tuberculosis Mother   . Heart disease  Father     bad valve  . Heart disease Brother     died at 1  . Heart attack Father   . Heart attack Paternal Grandmother   . Heart attack Brother   . Hypertension Neg Hx   . Stroke Neg Hx    Social History  Substance Use Topics  . Smoking status: Never Smoker   . Smokeless tobacco: Never Used  . Alcohol Use: No   OB History    No data available     Review of Systems  Constitutional: Negative for fever.  HENT: Negative for sore throat.   Eyes: Negative for visual disturbance.  Respiratory: Negative for cough and shortness of breath.   Cardiovascular: Negative for chest pain.  Gastrointestinal: Positive for nausea, abdominal pain, diarrhea, blood in stool and anal bleeding. Negative for vomiting.  Genitourinary: Negative for difficulty urinating.  Musculoskeletal: Negative for back pain and neck pain.  Skin: Negative for rash.  Neurological: Positive for light-headedness. Negative for syncope and headaches.      Allergies  Levaquin and Sulfa antibiotics  Home Medications   Prior to Admission medications   Medication Sig Start Date End Date Taking? Authorizing Provider  acetaminophen (TYLENOL) 500 MG tablet Take 500 mg by mouth every 6 (six) hours as needed for moderate pain.   Yes Historical Provider, MD  amiodarone (PACERONE) 200 MG tablet TAKE ONE TABLET BY MOUTH ONCE DAILY 01/24/16  Yes Belva Crome, MD  cloNIDine (CATAPRES) 0.2 MG tablet Take 1 tablet (0.2 mg total) by mouth 2 (two) times daily. 11/01/15  Yes Reyne Dumas, MD  desloratadine (CLARINEX) 5 MG tablet Take 5 mg by mouth daily.   Yes Historical Provider, MD  diltiazem (CARDIZEM CD) 180 MG 24 hr capsule Take 180 mg by mouth daily.   Yes Historical Provider, MD  ferrous sulfate 325 (65 FE) MG tablet Take 325 mg by mouth 2 (two) times daily.    Yes Historical Provider, MD  fluticasone (FLONASE) 50 MCG/ACT nasal spray Place 1 spray into both nostrils daily as needed for allergies or rhinitis.    Yes Historical  Provider, MD  furosemide (LASIX) 20 MG tablet Take 40-80 mg by mouth every other day. Alternates between 40 and 80 mg every other day.   Yes Historical Provider, MD  furosemide (LASIX) 80 MG tablet Take 1 tablet (80 mg total) by mouth daily. 11/01/15  Yes Reyne Dumas, MD  glimepiride (AMARYL) 1 MG tablet Take 1 tablet (1 mg total) by mouth daily with breakfast. 12/06/14  Yes Barton Dubois, MD  hydrALAZINE (APRESOLINE) 50 MG tablet Take 100 mg by mouth 3 (three) times daily.    Yes Historical Provider, MD  labetalol (NORMODYNE) 300 MG tablet Take 300 mg by mouth 2 (two) times daily.   Yes Historical Provider, MD  lovastatin (MEVACOR) 40 MG tablet Take 1 tablet (40 mg total) by mouth at bedtime. 01/05/14  Yes Belva Crome, MD  Multiple Vitamin (MULTIVITAMIN WITH MINERALS) TABS tablet Take 1 tablet by mouth daily. One a Day Women's   Yes Historical Provider, MD  Multiple Vitamins-Minerals (HAIR/SKIN/NAILS/BIOTIN) TABS Take 1 tablet by mouth daily.   Yes Historical Provider, MD  pantoprazole (PROTONIX) 40 MG tablet Take 40 mg by mouth daily.   Yes Historical Provider, MD  Polyvinyl Alcohol-Povidone (REFRESH OP) Place 1 drop into both eyes 2 (two) times daily as needed (for dry eyes).   Yes Historical Provider, MD  vitamin A 8000 UNIT capsule Take 8,000 Units by mouth daily.   Yes Historical Provider, MD  warfarin (COUMADIN) 2 MG tablet TAKE AS DIRECTED BY  COUMADIN  CLINIC Patient taking differently: Take 4 mg by mouth daily. With the exception of Sunday pt takes 2.5 tablets (5 mg) 11/01/15  Yes Reyne Dumas, MD  potassium chloride SA (K-DUR,KLOR-CON) 20 MEQ tablet Take 1 tablet (20 mEq total) by mouth 2 (two) times daily. Patient not taking: Reported on 01/27/2016 11/01/15   Reyne Dumas, MD   BP 167/64 mmHg  Pulse 62  Temp(Src) 98.4 F (36.9 C) (Oral)  Resp 14  SpO2 93% Physical Exam  Constitutional: She is oriented to person, place, and time. She appears well-developed and well-nourished. No  distress.  HENT:  Head: Normocephalic and atraumatic.  Eyes: Conjunctivae and EOM are normal.  Neck: Normal range of motion.  Cardiovascular: Normal rate, regular rhythm, normal heart sounds and intact distal pulses.  Exam reveals no gallop and no friction rub.   No murmur heard. Pulmonary/Chest: Effort normal and breath sounds normal. No respiratory distress. She has no wheezes. She has no rales.  Abdominal: Soft. She exhibits no distension. There is tenderness (diffuse). There is guarding (lower abdomen).  Musculoskeletal: She exhibits no edema or tenderness.  Neurological: She is alert and oriented to person, place, and time.  Skin: Skin is warm and dry. No rash noted. She is not diaphoretic. No erythema.  Nursing note and vitals reviewed.   ED Course  Procedures (including critical  care time) Labs Review Labs Reviewed  COMPREHENSIVE METABOLIC PANEL - Abnormal; Notable for the following:    Potassium 3.4 (*)    Glucose, Bld 151 (*)    BUN 31 (*)    Creatinine, Ser 1.80 (*)    Total Protein 6.3 (*)    GFR calc non Af Amer 25 (*)    GFR calc Af Amer 29 (*)    All other components within normal limits  PROTIME-INR - Abnormal; Notable for the following:    Prothrombin Time 24.2 (*)    INR 2.29 (*)    All other components within normal limits  GASTROINTESTINAL PANEL BY PCR, STOOL (REPLACES STOOL CULTURE)  C DIFFICILE QUICK SCREEN W PCR REFLEX  LIPASE, BLOOD  CBC  URINALYSIS, ROUTINE W REFLEX MICROSCOPIC (NOT AT [facility])  I-STAT CG4 LACTIC ACID, ED  I-STAT CG4 LACTIC ACID, ED  TYPE AND SCREEN  ABO/RH    Imaging Review Ct Abdomen Pelvis Wo Contrast  01/28/2016  CLINICAL DATA:  Acute onset of generalized abdominal pain and diarrhea. Initial encounter. EXAM: CT ABDOMEN AND PELVIS WITHOUT CONTRAST TECHNIQUE: Multidetector CT imaging of the abdomen and pelvis was performed following the standard protocol without IV contrast. COMPARISON:  None. FINDINGS: Minimal bibasilar  atelectasis or scarring is noted. Trace fluid is noted about the liver. The liver and spleen are unremarkable in appearance. A stone is noted within the gallbladder. The gallbladder is otherwise unremarkable. The pancreas and adrenal glands are unremarkable. A large right parapelvic renal cyst is noted, measuring 5.1 cm. Mild nonspecific perinephric stranding is noted bilaterally. Mild bilateral renal atrophy is seen. There is no evidence of hydronephrosis. No renal or ureteral stones are seen. No free fluid is identified. The small bowel is unremarkable in appearance. The stomach is within normal limits. No acute vascular abnormalities are seen. Scattered calcification is noted along the abdominal aorta and its branches. The patient is status post appendectomy. Scattered diverticulosis is noted along the sigmoid colon. Wall thickening is noted along the entirety of the sigmoid colon, with associated soft tissue inflammation, concerning for segmental colitis, either infectious or inflammatory in nature. The bladder is mildly distended and grossly remarkable. The patient is status post hysterectomy. No suspicious adnexal masses are seen. The ovaries are grossly symmetric. No inguinal lymphadenopathy is seen. No acute osseous abnormalities are identified. A nonspecific sclerotic focus is noted within vertebral body L3. Multilevel vacuum phenomenon is noted along the lower thoracic and lumbar spine. IMPRESSION: 1. Wall thickening along the entirety of the sigmoid colon, with associated soft tissue inflammation, concerning for segmental colitis, either infectious or inflammatory in nature. 2. Scattered diverticulosis along the sigmoid colon. 3. Trace ascites noted about the liver. 4. Cholelithiasis.  Gallbladder otherwise unremarkable. 5. Right renal parapelvic cyst noted. 6. Mild bilateral renal atrophy noted. 7. Scattered calcification along the abdominal aorta and its branches. Electronically Signed   By: Garald Balding M.D.   On: 01/28/2016 01:14   I have personally reviewed and evaluated these images and lab results as part of my medical decision-making.   EKG Interpretation None      MDM   Final diagnoses:  Colitis  Rectal bleeding   82 year old female with a history of hypertension, diabetes, CKD, paroxysmal atrial fibrillation on Coumadin, diastolic CHF, presents with concern for abdominal pain, diarrhea, lightheadedness. Likely lightheaded secondary to dehydration. Patient reports many episodes of diarrhea prior to developing bright red blood per rectum.  Blood observed, and is bright red, no blood clots,  and is less consistent with AVM/diverticular bleed and more consistent with colitis or internal hemorrhoids, however will continue to monitor symptoms. Hemoglobin is normal, and blood pressures and HR are within normal limits. Given low suspicion for serious GI bleed at this time, will not reverse coumadin, however will continue to monitor pt in hospital. Pt with abdominal pain and CT abdomen pelvis shows wall thickening of sigmoid colon, likely infectious colitis. Evidence also of diverticulosis. No recent abx, ate suspicious salad per pt. Ordered GI pathogen panel.  Patient to be admitted for continued abdominal pain, montioring in setting of likely colitis on coumadin.   Gareth Morgan, MD 01/28/16 [phone]

## 2016-01-27 NOTE — ED Notes (Signed)
Bed: WA03 Expected date:  Expected time:  Means of arrival:  Comments: EMS 82yo F diarrhea w/ bright red bleeding and clots

## 2016-01-27 NOTE — ED Notes (Signed)
Pt used bedpan, producing a small amount of frankly bloody stool.  Pain still reported as 5/10.

## 2016-01-28 ENCOUNTER — Encounter (HOSPITAL_COMMUNITY): Payer: Self-pay

## 2016-01-28 DIAGNOSIS — I4581 Long QT syndrome: Secondary | ICD-10-CM | POA: Diagnosis present

## 2016-01-28 DIAGNOSIS — Z85828 Personal history of other malignant neoplasm of skin: Secondary | ICD-10-CM | POA: Diagnosis not present

## 2016-01-28 DIAGNOSIS — R911 Solitary pulmonary nodule: Secondary | ICD-10-CM | POA: Diagnosis not present

## 2016-01-28 DIAGNOSIS — I10 Essential (primary) hypertension: Secondary | ICD-10-CM | POA: Diagnosis not present

## 2016-01-28 DIAGNOSIS — Z79899 Other long term (current) drug therapy: Secondary | ICD-10-CM | POA: Diagnosis not present

## 2016-01-28 DIAGNOSIS — A09 Infectious gastroenteritis and colitis, unspecified: Secondary | ICD-10-CM | POA: Diagnosis not present

## 2016-01-28 DIAGNOSIS — R188 Other ascites: Secondary | ICD-10-CM | POA: Diagnosis not present

## 2016-01-28 DIAGNOSIS — E1165 Type 2 diabetes mellitus with hyperglycemia: Secondary | ICD-10-CM | POA: Diagnosis present

## 2016-01-28 DIAGNOSIS — Z8249 Family history of ischemic heart disease and other diseases of the circulatory system: Secondary | ICD-10-CM | POA: Diagnosis not present

## 2016-01-28 DIAGNOSIS — I2510 Atherosclerotic heart disease of native coronary artery without angina pectoris: Secondary | ICD-10-CM | POA: Diagnosis present

## 2016-01-28 DIAGNOSIS — Z90710 Acquired absence of both cervix and uterus: Secondary | ICD-10-CM | POA: Diagnosis not present

## 2016-01-28 DIAGNOSIS — I480 Paroxysmal atrial fibrillation: Secondary | ICD-10-CM | POA: Diagnosis not present

## 2016-01-28 DIAGNOSIS — K219 Gastro-esophageal reflux disease without esophagitis: Secondary | ICD-10-CM | POA: Diagnosis present

## 2016-01-28 DIAGNOSIS — Z881 Allergy status to other antibiotic agents status: Secondary | ICD-10-CM | POA: Diagnosis not present

## 2016-01-28 DIAGNOSIS — E118 Type 2 diabetes mellitus with unspecified complications: Secondary | ICD-10-CM | POA: Diagnosis not present

## 2016-01-28 DIAGNOSIS — I5033 Acute on chronic diastolic (congestive) heart failure: Secondary | ICD-10-CM | POA: Diagnosis not present

## 2016-01-28 DIAGNOSIS — K529 Noninfective gastroenteritis and colitis, unspecified: Secondary | ICD-10-CM | POA: Diagnosis present

## 2016-01-28 DIAGNOSIS — N183 Chronic kidney disease, stage 3 (moderate): Secondary | ICD-10-CM | POA: Diagnosis not present

## 2016-01-28 DIAGNOSIS — E1122 Type 2 diabetes mellitus with diabetic chronic kidney disease: Secondary | ICD-10-CM | POA: Diagnosis present

## 2016-01-28 DIAGNOSIS — D62 Acute posthemorrhagic anemia: Secondary | ICD-10-CM | POA: Diagnosis present

## 2016-01-28 DIAGNOSIS — I951 Orthostatic hypotension: Secondary | ICD-10-CM | POA: Diagnosis present

## 2016-01-28 DIAGNOSIS — J42 Unspecified chronic bronchitis: Secondary | ICD-10-CM | POA: Diagnosis present

## 2016-01-28 DIAGNOSIS — Z7901 Long term (current) use of anticoagulants: Secondary | ICD-10-CM | POA: Diagnosis not present

## 2016-01-28 DIAGNOSIS — E785 Hyperlipidemia, unspecified: Secondary | ICD-10-CM | POA: Diagnosis present

## 2016-01-28 DIAGNOSIS — Z9049 Acquired absence of other specified parts of digestive tract: Secondary | ICD-10-CM | POA: Diagnosis not present

## 2016-01-28 DIAGNOSIS — M810 Age-related osteoporosis without current pathological fracture: Secondary | ICD-10-CM | POA: Diagnosis present

## 2016-01-28 DIAGNOSIS — E876 Hypokalemia: Secondary | ICD-10-CM | POA: Diagnosis present

## 2016-01-28 DIAGNOSIS — R109 Unspecified abdominal pain: Secondary | ICD-10-CM | POA: Diagnosis not present

## 2016-01-28 DIAGNOSIS — I129 Hypertensive chronic kidney disease with stage 1 through stage 4 chronic kidney disease, or unspecified chronic kidney disease: Secondary | ICD-10-CM | POA: Diagnosis present

## 2016-01-28 DIAGNOSIS — K8020 Calculus of gallbladder without cholecystitis without obstruction: Secondary | ICD-10-CM | POA: Diagnosis not present

## 2016-01-28 DIAGNOSIS — I509 Heart failure, unspecified: Secondary | ICD-10-CM | POA: Diagnosis not present

## 2016-01-28 DIAGNOSIS — E860 Dehydration: Secondary | ICD-10-CM | POA: Diagnosis present

## 2016-01-28 DIAGNOSIS — R0602 Shortness of breath: Secondary | ICD-10-CM | POA: Diagnosis not present

## 2016-01-28 DIAGNOSIS — M1990 Unspecified osteoarthritis, unspecified site: Secondary | ICD-10-CM | POA: Diagnosis present

## 2016-01-28 DIAGNOSIS — R197 Diarrhea, unspecified: Secondary | ICD-10-CM | POA: Diagnosis not present

## 2016-01-28 DIAGNOSIS — Z882 Allergy status to sulfonamides status: Secondary | ICD-10-CM | POA: Diagnosis not present

## 2016-01-28 LAB — URINALYSIS, ROUTINE W REFLEX MICROSCOPIC
Bilirubin Urine: NEGATIVE
GLUCOSE, UA: 100 mg/dL — AB
HGB URINE DIPSTICK: NEGATIVE
Ketones, ur: NEGATIVE mg/dL
Nitrite: NEGATIVE
PH: 5 (ref 5.0–8.0)
Protein, ur: 100 mg/dL — AB
SPECIFIC GRAVITY, URINE: 1.017 (ref 1.005–1.030)

## 2016-01-28 LAB — GLUCOSE, CAPILLARY
GLUCOSE-CAPILLARY: 124 mg/dL — AB (ref 65–99)
Glucose-Capillary: 154 mg/dL — ABNORMAL HIGH (ref 65–99)
Glucose-Capillary: 127 mg/dL — ABNORMAL HIGH (ref 65–99)
Glucose-Capillary: 124 mg/dL — ABNORMAL HIGH (ref 65–99)

## 2016-01-28 LAB — URINE MICROSCOPIC-ADD ON

## 2016-01-28 LAB — GASTROINTESTINAL PANEL BY PCR, STOOL (REPLACES STOOL CULTURE)
ADENOVIRUS F40/41: NOT DETECTED
Astrovirus: NOT DETECTED
CAMPYLOBACTER SPECIES: NOT DETECTED
CRYPTOSPORIDIUM: NOT DETECTED
CYCLOSPORA CAYETANENSIS: NOT DETECTED
E. COLI O157: NOT DETECTED
ENTEROPATHOGENIC E COLI (EPEC): NOT DETECTED
Entamoeba histolytica: NOT DETECTED
Enteroaggregative E coli (EAEC): NOT DETECTED
Enterotoxigenic E coli (ETEC): NOT DETECTED
Giardia lamblia: NOT DETECTED
Norovirus GI/GII: NOT DETECTED
PLESIMONAS SHIGELLOIDES: NOT DETECTED
ROTAVIRUS A: NOT DETECTED
SALMONELLA SPECIES: NOT DETECTED
SAPOVIRUS (I, II, IV, AND V): NOT DETECTED
SHIGA LIKE TOXIN PRODUCING E COLI (STEC): NOT DETECTED
SHIGELLA/ENTEROINVASIVE E COLI (EIEC): NOT DETECTED
VIBRIO SPECIES: NOT DETECTED
Vibrio cholerae: NOT DETECTED
YERSINIA ENTEROCOLITICA: NOT DETECTED

## 2016-01-28 LAB — CBC
HEMATOCRIT: 35 % — AB (ref 36.0–46.0)
HEMOGLOBIN: 11.6 g/dL — AB (ref 12.0–15.0)
MCH: 27.6 pg (ref 26.0–34.0)
MCHC: 33.1 g/dL (ref 30.0–36.0)
MCV: 83.3 fL (ref 78.0–100.0)
Platelets: 158 10*3/uL (ref 150–400)
RBC: 4.2 MIL/uL (ref 3.87–5.11)
RDW: 15.2 % (ref 11.5–15.5)
WBC: 8.4 10*3/uL (ref 4.0–10.5)

## 2016-01-28 LAB — BASIC METABOLIC PANEL
Anion gap: 7 (ref 5–15)
BUN: 28 mg/dL — AB (ref 6–20)
CHLORIDE: 108 mmol/L (ref 101–111)
CO2: 27 mmol/L (ref 22–32)
CREATININE: 1.96 mg/dL — AB (ref 0.44–1.00)
Calcium: 8.9 mg/dL (ref 8.9–10.3)
GFR calc Af Amer: 26 mL/min — ABNORMAL LOW (ref >60)
GFR calc non Af Amer: 22 mL/min — ABNORMAL LOW (ref >60)
Glucose, Bld: 112 mg/dL — ABNORMAL HIGH (ref 65–99)
POTASSIUM: 2.9 mmol/L — AB (ref 3.5–5.1)
Sodium: 142 mmol/L (ref 135–145)

## 2016-01-28 LAB — ABO/RH: ABO/RH(D): O POS

## 2016-01-28 LAB — C DIFFICILE QUICK SCREEN W PCR REFLEX
C DIFFICILE (CDIFF) INTERP: NEGATIVE
C DIFFICILE (CDIFF) TOXIN: NEGATIVE
C Diff antigen: NEGATIVE

## 2016-01-28 LAB — I-STAT CG4 LACTIC ACID, ED: Lactic Acid, Venous: 0.66 mmol/L (ref 0.5–2.0)

## 2016-01-28 MED ORDER — SODIUM CHLORIDE 0.9 % IV SOLN
INTRAVENOUS | Status: DC
  Administered 2016-01-28: 04:00:00 via INTRAVENOUS

## 2016-01-28 MED ORDER — LABETALOL HCL 300 MG PO TABS
300.0000 mg | ORAL_TABLET | Freq: Two times a day (BID) | ORAL | Status: DC
  Administered 2016-01-28 – 2016-02-02 (×10): 300 mg via ORAL
  Filled 2016-01-28 (×12): qty 1

## 2016-01-28 MED ORDER — INSULIN ASPART 100 UNIT/ML ~~LOC~~ SOLN
0.0000 [IU] | Freq: Three times a day (TID) | SUBCUTANEOUS | Status: DC
  Administered 2016-01-28: 2 [IU] via SUBCUTANEOUS
  Administered 2016-01-28 (×2): 1 [IU] via SUBCUTANEOUS
  Administered 2016-01-29 – 2016-01-30 (×2): 2 [IU] via SUBCUTANEOUS
  Administered 2016-01-31 – 2016-02-01 (×3): 1 [IU] via SUBCUTANEOUS
  Administered 2016-02-01 – 2016-02-02 (×2): 2 [IU] via SUBCUTANEOUS
  Administered 2016-02-02: 3 [IU] via SUBCUTANEOUS

## 2016-01-28 MED ORDER — FERROUS SULFATE 325 (65 FE) MG PO TABS: 325.0000 mg | ORAL_TABLET | Freq: Two times a day (BID) | ORAL | Status: DC

## 2016-01-28 MED ORDER — FLUTICASONE PROPIONATE 50 MCG/ACT NA SUSP
1.0000 | Freq: Every day | NASAL | Status: DC | PRN
  Filled 2016-01-28: qty 16

## 2016-01-28 MED ORDER — ONDANSETRON HCL 4 MG/2ML IJ SOLN
4.0000 mg | Freq: Three times a day (TID) | INTRAMUSCULAR | Status: DC | PRN
Start: 2016-01-28 — End: 2016-02-02
  Administered 2016-01-28 – 2016-01-31 (×3): 4 mg via INTRAVENOUS
  Filled 2016-01-28 (×3): qty 2

## 2016-01-28 MED ORDER — POTASSIUM CHLORIDE CRYS ER 20 MEQ PO TBCR
40.0000 meq | EXTENDED_RELEASE_TABLET | Freq: Once | ORAL | Status: AC
  Administered 2016-01-28: 40 meq via ORAL
  Filled 2016-01-28: qty 2

## 2016-01-28 MED ORDER — CLONIDINE HCL 0.1 MG PO TABS: 0.2000 mg | ORAL_TABLET | Freq: Two times a day (BID) | ORAL | Status: DC

## 2016-01-28 MED ORDER — METRONIDAZOLE IN NACL 5-0.79 MG/ML-% IV SOLN
500.0000 mg | Freq: Three times a day (TID) | INTRAVENOUS | Status: DC
  Administered 2016-01-28 – 2016-01-31 (×10): 500 mg via INTRAVENOUS
  Filled 2016-01-28 (×11): qty 100

## 2016-01-28 MED ORDER — PANTOPRAZOLE SODIUM 40 MG PO TBEC
40.0000 mg | DELAYED_RELEASE_TABLET | Freq: Every day | ORAL | Status: DC
  Administered 2016-01-28 – 2016-02-02 (×6): 40 mg via ORAL
  Filled 2016-01-28 (×7): qty 1

## 2016-01-28 MED ORDER — MORPHINE SULFATE (PF) 2 MG/ML IV SOLN: 1.0000 mg | INTRAVENOUS | Status: DC | PRN

## 2016-01-28 MED ORDER — FERROUS SULFATE 325 (65 FE) MG PO TABS
325.0000 mg | ORAL_TABLET | Freq: Two times a day (BID) | ORAL | Status: DC
  Administered 2016-01-28 – 2016-02-02 (×11): 325 mg via ORAL
  Filled 2016-01-28 (×13): qty 1

## 2016-01-28 MED ORDER — OXYCODONE HCL 5 MG PO TABS: 5.0000 mg | ORAL_TABLET | ORAL | Status: DC | PRN

## 2016-01-28 MED ORDER — LABETALOL HCL 300 MG PO TABS: 300.0000 mg | ORAL_TABLET | Freq: Two times a day (BID) | ORAL | Status: DC

## 2016-01-28 MED ORDER — ACETAMINOPHEN 500 MG PO TABS
500.0000 mg | ORAL_TABLET | Freq: Four times a day (QID) | ORAL | Status: DC | PRN
Start: 2016-01-28 — End: 2016-02-02
  Administered 2016-01-29 – 2016-01-31 (×3): 500 mg via ORAL
  Filled 2016-01-28 (×3): qty 1

## 2016-01-28 MED ORDER — AZITHROMYCIN 500 MG PO TABS
500.0000 mg | ORAL_TABLET | Freq: Every day | ORAL | Status: DC
  Administered 2016-01-28 – 2016-02-02 (×6): 500 mg via ORAL
  Filled 2016-01-28 (×6): qty 1

## 2016-01-28 MED ORDER — CLONIDINE HCL 0.2 MG PO TABS
0.2000 mg | ORAL_TABLET | Freq: Two times a day (BID) | ORAL | Status: DC
  Administered 2016-01-28 – 2016-02-02 (×11): 0.2 mg via ORAL
  Filled 2016-01-28 (×12): qty 1

## 2016-01-28 MED ORDER — DILTIAZEM HCL ER COATED BEADS 180 MG PO CP24
180.0000 mg | ORAL_CAPSULE | Freq: Every day | ORAL | Status: DC
  Administered 2016-01-28 – 2016-02-02 (×6): 180 mg via ORAL
  Filled 2016-01-28 (×6): qty 1

## 2016-01-28 MED ORDER — HYDROCODONE-ACETAMINOPHEN 5-325 MG PO TABS
1.0000 | ORAL_TABLET | Freq: Once | ORAL | Status: DC
  Filled 2016-01-28: qty 1

## 2016-01-28 MED ORDER — HYDRALAZINE HCL 50 MG PO TABS
100.0000 mg | ORAL_TABLET | Freq: Three times a day (TID) | ORAL | Status: DC
  Administered 2016-01-28 – 2016-02-02 (×13): 100 mg via ORAL
  Filled 2016-01-28 (×18): qty 2

## 2016-01-28 MED ORDER — AMIODARONE HCL 200 MG PO TABS
200.0000 mg | ORAL_TABLET | Freq: Every day | ORAL | Status: DC
  Administered 2016-01-28 – 2016-02-02 (×6): 200 mg via ORAL
  Filled 2016-01-28 (×6): qty 1

## 2016-01-28 MED ORDER — GLIMEPIRIDE 1 MG PO TABS: 1.0000 mg | ORAL_TABLET | Freq: Every day | ORAL | Status: DC

## 2016-01-28 MED ORDER — MORPHINE SULFATE (PF) 2 MG/ML IV SOLN
1.0000 mg | Freq: Once | INTRAVENOUS | Status: AC
  Administered 2016-01-28: 1 mg via INTRAVENOUS
  Filled 2016-01-28: qty 1

## 2016-01-28 MED ORDER — PRAVASTATIN SODIUM 40 MG PO TABS
40.0000 mg | ORAL_TABLET | Freq: Every day | ORAL | Status: DC
  Administered 2016-01-28 – 2016-02-01 (×5): 40 mg via ORAL
  Filled 2016-01-28 (×6): qty 1

## 2016-01-28 NOTE — H&P (Signed)
[facility] History and Physical  Ariel Avila [id number] DOB: [birth year] DOA: 01/27/2016  Referring physician: EDP PCP: Anthoney Harada, MD   Chief Complaint: Diarrhea   HPI: Ariel Avila is a 82 y.o. female who presents to the ED with 1 day history of diarrhea.  There is associated 8/10 abdominal pain, patients diarrhea started out watery, and now has small amounts of blood in it as the evening has progressed.  Patient does admit to eating a salad prior to onset of symptoms that was somewhat suspicious for possible food borne illness (She got salad at a fish food place on Wed, ate it Thursday evening, symptoms onset Friday).  Review of Systems: Systems reviewed.  As above, otherwise negative  Past Medical History  Diagnosis Date  . Hypertension   . Allergy   . GERD (gastroesophageal reflux disease)   . Hyperlipidemia   . Osteoporosis   . Chronic bronchitis ([place])     "lately" (06/23/2014)  . Type II diabetes mellitus ([place])   . H/O hiatal hernia   . Arthritis     "fingers and back" (06/23/2014)  . Chronic kidney disease (CKD), stage III (moderate)   . Basal cell carcinoma   . Paroxysmal a-fib ([place])   . Dysrhythmia   . CHF (congestive heart failure) [facility])    Past Surgical History  Procedure Laterality Date  . Abdominal hysterectomy    . Appendectomy    . Basal cell carcinoma excision      "face and nose"  . Cardioversion N/A 10/24/2015    Procedure: CARDIOVERSION;  Surgeon: Pixie Casino, MD;  Location: [facility] ENDOSCOPY;  Service: Cardiovascular;  Laterality: N/A;   Social History:  reports that she has never smoked. She has never used smokeless tobacco. She reports that she does not drink alcohol or use illicit drugs.  Allergies  Allergen Reactions  . Levaquin [Levofloxacin] Hives, Itching and Other (See Comments)    insomnia  . Sulfa Antibiotics Rash    Family History  Problem Relation Age of Onset  . Tuberculosis Mother   . Heart disease Father      bad valve  . Heart disease Brother     died at 37  . Heart attack Father   . Heart attack Paternal Grandmother   . Heart attack Brother   . Hypertension Neg Hx   . Stroke Neg Hx      Prior to Admission medications   Medication Sig Start Date End Date Taking? Authorizing Provider  acetaminophen (TYLENOL) 500 MG tablet Take 500 mg by mouth every 6 (six) hours as needed for moderate pain.   Yes Historical Provider, MD  amiodarone (PACERONE) 200 MG tablet TAKE ONE TABLET BY MOUTH ONCE DAILY 01/24/16  Yes Belva Crome, MD  cloNIDine (CATAPRES) 0.2 MG tablet Take 1 tablet (0.2 mg total) by mouth 2 (two) times daily. 11/01/15  Yes Reyne Dumas, MD  desloratadine (CLARINEX) 5 MG tablet Take 5 mg by mouth daily.   Yes Historical Provider, MD  diltiazem (CARDIZEM CD) 180 MG 24 hr capsule Take 180 mg by mouth daily.   Yes Historical Provider, MD  ferrous sulfate 325 (65 FE) MG tablet Take 325 mg by mouth 2 (two) times daily.    Yes Historical Provider, MD  fluticasone (FLONASE) 50 MCG/ACT nasal spray Place 1 spray into both nostrils daily as needed for allergies or rhinitis.    Yes Historical Provider, MD  furosemide (LASIX) 20 MG tablet Take 40-80 mg by mouth every  other day. Alternates between 40 and 80 mg every other day.   Yes Historical Provider, MD  furosemide (LASIX) 80 MG tablet Take 1 tablet (80 mg total) by mouth daily. 11/01/15  Yes Reyne Dumas, MD  glimepiride (AMARYL) 1 MG tablet Take 1 tablet (1 mg total) by mouth daily with breakfast. 12/06/14  Yes Barton Dubois, MD  hydrALAZINE (APRESOLINE) 50 MG tablet Take 100 mg by mouth 3 (three) times daily.    Yes Historical Provider, MD  labetalol (NORMODYNE) 300 MG tablet Take 300 mg by mouth 2 (two) times daily.   Yes Historical Provider, MD  lovastatin (MEVACOR) 40 MG tablet Take 1 tablet (40 mg total) by mouth at bedtime. 01/05/14  Yes Belva Crome, MD  Multiple Vitamin (MULTIVITAMIN WITH MINERALS) TABS tablet Take 1 tablet by mouth daily. One  a Day Women's   Yes Historical Provider, MD  Multiple Vitamins-Minerals (HAIR/SKIN/NAILS/BIOTIN) TABS Take 1 tablet by mouth daily.   Yes Historical Provider, MD  pantoprazole (PROTONIX) 40 MG tablet Take 40 mg by mouth daily.   Yes Historical Provider, MD  Polyvinyl Alcohol-Povidone (REFRESH OP) Place 1 drop into both eyes 2 (two) times daily as needed (for dry eyes).   Yes Historical Provider, MD  vitamin A 8000 UNIT capsule Take 8,000 Units by mouth daily.   Yes Historical Provider, MD  warfarin (COUMADIN) 2 MG tablet TAKE AS DIRECTED BY  COUMADIN  CLINIC Patient taking differently: Take 4 mg by mouth daily. With the exception of Sunday pt takes 2.5 tablets (5 mg) 11/01/15  Yes Reyne Dumas, MD   Physical Exam: Filed Vitals:   01/28/16 0130 01/28/16 0200  BP: 171/64 167/64  Pulse: 62 62  Temp:    Resp: 15 14    BP 167/64 mmHg  Pulse 62  Temp(Src) 98.4 F (36.9 C) (Oral)  Resp 14  SpO2 93%  General Appearance:    Alert, oriented, no distress, appears stated age  Head:    Normocephalic, atraumatic  Eyes:    PERRL, EOMI, sclera non-icteric        Nose:   Nares without drainage or epistaxis. Mucosa, turbinates normal  Throat:   Moist mucous membranes. Oropharynx without erythema or exudate.  Neck:   Supple. No carotid bruits.  No thyromegaly.  No lymphadenopathy.   Back:     No CVA tenderness, no spinal tenderness  Lungs:     Clear to auscultation bilaterally, without wheezes, rhonchi or rales  Chest wall:    No tenderness to palpitation  Heart:    Regular rate and rhythm without murmurs, gallops, rubs  Abdomen:     Soft, non-tender, nondistended, normal bowel sounds, no organomegaly  Genitalia:    deferred  Rectal:    deferred  Extremities:   No clubbing, cyanosis or edema.  Pulses:   2+ and symmetric all extremities  Skin:   Skin color, texture, turgor normal, no rashes or lesions  Lymph nodes:   Cervical, supraclavicular, and axillary nodes normal  Neurologic:   CNII-XII  intact. Normal strength, sensation and reflexes      throughout    Labs on Admission:  Basic Metabolic Panel:  Recent Labs Lab 01/27/16 2218  NA 142  K 3.4*  CL 108  CO2 24  GLUCOSE 151*  BUN 31*  CREATININE 1.80*  CALCIUM 9.4   Liver Function Tests:  Recent Labs Lab 01/27/16 2218  AST 22  ALT 22  ALKPHOS 66  BILITOT 0.5  PROT 6.3*  ALBUMIN 4.1  Recent Labs Lab 01/27/16 2218  LIPASE 24   No results for input(s): AMMONIA in the last 168 hours. CBC:  Recent Labs Lab 01/27/16 2218  WBC 8.5  HGB 12.3  HCT 37.0  MCV 84.3  PLT 171   Cardiac Enzymes: No results for input(s): CKTOTAL, CKMB, CKMBINDEX, TROPONINI in the last 168 hours.  BNP (last 3 results) No results for input(s): PROBNP in the last 8760 hours. CBG: No results for input(s): GLUCAP in the last 168 hours.  Radiological Exams on Admission: Ct Abdomen Pelvis Wo Contrast  01/28/2016  CLINICAL DATA:  Acute onset of generalized abdominal pain and diarrhea. Initial encounter. EXAM: CT ABDOMEN AND PELVIS WITHOUT CONTRAST TECHNIQUE: Multidetector CT imaging of the abdomen and pelvis was performed following the standard protocol without IV contrast. COMPARISON:  None. FINDINGS: Minimal bibasilar atelectasis or scarring is noted. Trace fluid is noted about the liver. The liver and spleen are unremarkable in appearance. A stone is noted within the gallbladder. The gallbladder is otherwise unremarkable. The pancreas and adrenal glands are unremarkable. A large right parapelvic renal cyst is noted, measuring 5.1 cm. Mild nonspecific perinephric stranding is noted bilaterally. Mild bilateral renal atrophy is seen. There is no evidence of hydronephrosis. No renal or ureteral stones are seen. No free fluid is identified. The small bowel is unremarkable in appearance. The stomach is within normal limits. No acute vascular abnormalities are seen. Scattered calcification is noted along the abdominal aorta and its  branches. The patient is status post appendectomy. Scattered diverticulosis is noted along the sigmoid colon. Wall thickening is noted along the entirety of the sigmoid colon, with associated soft tissue inflammation, concerning for segmental colitis, either infectious or inflammatory in nature. The bladder is mildly distended and grossly remarkable. The patient is status post hysterectomy. No suspicious adnexal masses are seen. The ovaries are grossly symmetric. No inguinal lymphadenopathy is seen. No acute osseous abnormalities are identified. A nonspecific sclerotic focus is noted within vertebral body L3. Multilevel vacuum phenomenon is noted along the lower thoracic and lumbar spine. IMPRESSION: 1. Wall thickening along the entirety of the sigmoid colon, with associated soft tissue inflammation, concerning for segmental colitis, either infectious or inflammatory in nature. 2. Scattered diverticulosis along the sigmoid colon. 3. Trace ascites noted about the liver. 4. Cholelithiasis.  Gallbladder otherwise unremarkable. 5. Right renal parapelvic cyst noted. 6. Mild bilateral renal atrophy noted. 7. Scattered calcification along the abdominal aorta and its branches. Electronically Signed   By: Garald Balding M.D.   On: 01/28/2016 01:14    EKG: Independently reviewed.  Assessment/Plan Principal Problem:   Colitis presumed infectious Active Problems:   Atrial fibrillation (HCC)   Essential hypertension   Diabetes mellitus type 2, uncontrolled, with complications (HCC)   CKD (chronic kidney disease) stage 3, GFR 30-59 ml/min   Diarrhea   1. Colitis, presumed infectious - 1. Allergy to fluroquinolones, will use azithromycin instead of the usual cipro.  Adding Flagyl as well 2. IVF 3. Holding lasix 2. CKD stage 4 at baseline - keep eye on renal function during stay, IVF and holding lasix 3. HTN - continue home meds 4. A.Fib - 1. Rhythm controlled with amiodarone, continue this,  cardizem 2. Holding coumadin due to diarrhea progressing to bloody diarrhea at this point 5. DM2 - 1. Holding amaryl 2. Sensitive scale SSI AC/HS    Code Status: Full  Family Communication: Daughter at bedside Disposition Plan: Admit to inpatient   Time spent: 51 min  Nahdia Doucet M.  [facility] Pager [phone]  If 7AM-7PM, please contact the day team taking care of the patient [email or web address] Password [facility] 01/28/2016, 2:48 AM

## 2016-01-28 NOTE — Progress Notes (Signed)
[facility] PROGRESS NOTE  Ariel Avila [id number] DOB: 1930/06/01 DOA: 01/27/2016  PCP: Anthoney Harada, MD  Brief HPI: 82 year old Caucasian female presented to the emergency department with one-day history of diarrhea with abdominal pain. Evaluation in the emergency department revealed colitis involving the sigmoid colon. Patient admitted to eating salad that was from a restaurant and kept in her refrigerator for more than 24 hours.  Past medical history:  Past Medical History  Diagnosis Date  . Hypertension   . Allergy   . GERD (gastroesophageal reflux disease)   . Hyperlipidemia   . Osteoporosis   . Chronic bronchitis ([place])     "lately" (06/23/2014)  . Type II diabetes mellitus ([place])   . H/O hiatal hernia   . Arthritis     "fingers and back" (06/23/2014)  . Chronic kidney disease (CKD), stage III (moderate)   . Basal cell carcinoma   . Paroxysmal a-fib ([place])   . Dysrhythmia   . CHF (congestive heart failure) ([place])     Consultants: None  Procedures: None  Antibiotics: Flagyl and azithromycin.   Subjective: Patient continues to have abdominal pain with diarrhea. She has also had a few episodes of blood in stool. None this morning. Denies any nausea, vomiting. Pain is 6 out of 10 in intensity. Her daughter is at the bedside.  Objective: Vital Signs  Filed Vitals:   01/28/16 0200 01/28/16 0230 01/28/16 0300 01/28/16 0518  BP: 167/64 166/60 169/62 164/52  Pulse: 62 61 63 65  Temp:    98.8 F (37.1 C)  TempSrc:    Oral  Resp: 14 16 15    SpO2: 93% 92% 92% 89%   No intake or output data in the 24 hours ending 01/28/16 1118 There were no vitals filed for this visit.  General appearance: alert, cooperative, appears stated age and no distress Resp: clear to auscultation bilaterally Cardio: regular rate and rhythm, S1, S2 normal, no murmur, click, rub or gallop GI: Abdomen is soft. Tender diffusely without any rebound, rigidity or guarding. No masses  or organomegaly. Bowel sounds are present. Extremities: extremities normal, atraumatic, no cyanosis or edema Neurologic: Alert and oriented 3. Cranial nerves II-12 intact. Motor strength equal bilateral upper and lower extremities.  Lab Results:  Basic Metabolic Panel:  Recent Labs Lab 01/27/16 2218 01/28/16 1010  NA 142 142  K 3.4* 2.9*  CL 108 108  CO2 24 27  GLUCOSE 151* 112*  BUN 31* 28*  CREATININE 1.80* 1.96*  CALCIUM 9.4 8.9   Liver Function Tests:  Recent Labs Lab 01/27/16 2218  AST 22  ALT 22  ALKPHOS 66  BILITOT 0.5  PROT 6.3*  ALBUMIN 4.1    Recent Labs Lab 01/27/16 2218  LIPASE 24   CBC:  Recent Labs Lab 01/27/16 2218 01/28/16 1010  WBC 8.5 8.4  HGB 12.3 11.6*  HCT 37.0 35.0*  MCV 84.3 83.3  PLT 171 158   CBG:  Recent Labs Lab 01/28/16 0748  GLUCAP 154*    Recent Results (from the past 240 hour(s))  C difficile quick scan w PCR reflex     Status: None   Collection Time: 01/28/16  4:43 AM  Result Value Ref Range Status   C Diff antigen NEGATIVE NEGATIVE Final   C Diff toxin NEGATIVE NEGATIVE Final   C Diff interpretation Negative for toxigenic C. difficile  Final      Studies/Results: Ct Abdomen Pelvis Wo Contrast  01/28/2016  CLINICAL DATA:  Acute onset of generalized  abdominal pain and diarrhea. Initial encounter. EXAM: CT ABDOMEN AND PELVIS WITHOUT CONTRAST TECHNIQUE: Multidetector CT imaging of the abdomen and pelvis was performed following the standard protocol without IV contrast. COMPARISON:  None. FINDINGS: Minimal bibasilar atelectasis or scarring is noted. Trace fluid is noted about the liver. The liver and spleen are unremarkable in appearance. A stone is noted within the gallbladder. The gallbladder is otherwise unremarkable. The pancreas and adrenal glands are unremarkable. A large right parapelvic renal cyst is noted, measuring 5.1 cm. Mild nonspecific perinephric stranding is noted bilaterally. Mild bilateral renal  atrophy is seen. There is no evidence of hydronephrosis. No renal or ureteral stones are seen. No free fluid is identified. The small bowel is unremarkable in appearance. The stomach is within normal limits. No acute vascular abnormalities are seen. Scattered calcification is noted along the abdominal aorta and its branches. The patient is status post appendectomy. Scattered diverticulosis is noted along the sigmoid colon. Wall thickening is noted along the entirety of the sigmoid colon, with associated soft tissue inflammation, concerning for segmental colitis, either infectious or inflammatory in nature. The bladder is mildly distended and grossly remarkable. The patient is status post hysterectomy. No suspicious adnexal masses are seen. The ovaries are grossly symmetric. No inguinal lymphadenopathy is seen. No acute osseous abnormalities are identified. A nonspecific sclerotic focus is noted within vertebral body L3. Multilevel vacuum phenomenon is noted along the lower thoracic and lumbar spine. IMPRESSION: 1. Wall thickening along the entirety of the sigmoid colon, with associated soft tissue inflammation, concerning for segmental colitis, either infectious or inflammatory in nature. 2. Scattered diverticulosis along the sigmoid colon. 3. Trace ascites noted about the liver. 4. Cholelithiasis.  Gallbladder otherwise unremarkable. 5. Right renal parapelvic cyst noted. 6. Mild bilateral renal atrophy noted. 7. Scattered calcification along the abdominal aorta and its branches. Electronically Signed   By: Garald Balding M.D.   On: 01/28/2016 01:14    Medications:  Scheduled: . amiodarone  200 mg Oral Daily  . azithromycin  500 mg Oral Daily  . cloNIDine  0.2 mg Oral BID  . diltiazem  180 mg Oral Daily  . ferrous sulfate  325 mg Oral BID PC  . hydrALAZINE  100 mg Oral TID  . insulin aspart  0-9 Units Subcutaneous TID WC  . labetalol  300 mg Oral BID  . metronidazole  500 mg Intravenous Q8H  .  pantoprazole  40 mg Oral Daily  . potassium chloride  40 mEq Oral Once  . pravastatin  40 mg Oral q1800   Continuous: . sodium chloride 75 mL/hr at 01/28/16 0418   [id number], fluticasone, morphine injection, ondansetron (ZOFRAN) IV, oxyCODONE  Assessment/Plan:  Principal Problem:   Colitis presumed infectious Active Problems:   Atrial fibrillation (HCC)   Essential hypertension   Diabetes mellitus type 2, uncontrolled, with complications (HCC)   CKD (chronic kidney disease) stage 3, GFR 30-59 ml/min   Diarrhea    Acute colitis with hematochezia Most likely infectious. Continue antibiotics as outlined above. C. difficile is negative. GI pathogen panel is pending. Continue to provide supportive care. Lactic acid level is normal. Bleeding likely due to colitis. Made worse due to the fact that she is on anticoagulation. Monitor hemoglobin closely. Hold her anticoagulation for now. We will need to determine timing of last colonoscopy.  History of chronic kidney disease stage IV Renal function is at baseline. Continue to monitor urine output. Repeat labs periodically.  Hypokalemia. This is secondary to diarrhea. Potassium will be  supplemented. Check magnesium level in the morning.  History of atrial fibrillation Rhythm is controlled with amiodarone. Patient is also on rate limiting drugs including beta blocker, which will be continued. Patient is also on warfarin. This has been held due to episodes of rectal bleeding. Check PT/INR again tomorrow morning. Has not had any rectal bleeding overnight. No need to acutely reverse her INR.  Diabetes mellitus type 2 with chronic kidney disease Monitor CBGs closely. Continue sliding scale coverage. Holding her oral agents.  DVT Prophylaxis: Was on warfarin. INR was therapeutic yesterday.    Code Status: Full code.  Family Communication: Discussed with the patient and her daughter  Disposition Plan: Continue current management. Await  improvement.    LOS: 0 days   [facility] Pager [phone] 01/28/2016, 11:18 AM  If 7PM-7AM, please contact night-coverage at [email or web address], password [facility]

## 2016-01-29 LAB — CBC
HEMATOCRIT: 33.1 % — AB (ref 36.0–46.0)
HEMOGLOBIN: 10.9 g/dL — AB (ref 12.0–15.0)
MCH: 28.6 pg (ref 26.0–34.0)
MCHC: 32.9 g/dL (ref 30.0–36.0)
MCV: 86.9 fL (ref 78.0–100.0)
Platelets: 160 10*3/uL (ref 150–400)
RBC: 3.81 MIL/uL — ABNORMAL LOW (ref 3.87–5.11)
RDW: 15.7 % — ABNORMAL HIGH (ref 11.5–15.5)
WBC: 8.8 10*3/uL (ref 4.0–10.5)

## 2016-01-29 LAB — GLUCOSE, CAPILLARY
GLUCOSE-CAPILLARY: 96 mg/dL (ref 65–99)
GLUCOSE-CAPILLARY: 194 mg/dL — AB (ref 65–99)
GLUCOSE-CAPILLARY: 117 mg/dL — AB (ref 65–99)
Glucose-Capillary: 117 mg/dL — ABNORMAL HIGH (ref 65–99)

## 2016-01-29 LAB — BASIC METABOLIC PANEL
ANION GAP: 7 (ref 5–15)
BUN: 25 mg/dL — ABNORMAL HIGH (ref 6–20)
CHLORIDE: 111 mmol/L (ref 101–111)
CO2: 22 mmol/L (ref 22–32)
Calcium: 8.2 mg/dL — ABNORMAL LOW (ref 8.9–10.3)
Creatinine, Ser: 1.99 mg/dL — ABNORMAL HIGH (ref 0.44–1.00)
GFR calc non Af Amer: 22 mL/min — ABNORMAL LOW (ref >60)
GFR, EST AFRICAN AMERICAN: 25 mL/min — AB (ref >60)
GLUCOSE: 111 mg/dL — AB (ref 65–99)
POTASSIUM: 3.8 mmol/L (ref 3.5–5.1)
Sodium: 140 mmol/L (ref 135–145)

## 2016-01-29 LAB — PROTIME-INR
INR: 2.83 — AB (ref 0.00–1.49)
Prothrombin Time: 28.5 seconds — ABNORMAL HIGH (ref 11.6–15.2)

## 2016-01-29 LAB — MAGNESIUM: MAGNESIUM: 1.8 mg/dL (ref 1.7–2.4)

## 2016-01-29 NOTE — Progress Notes (Signed)
[facility] PROGRESS NOTE  Ariel Avila [id number] DOB: March 20, 1930 DOA: 01/27/2016  PCP: Anthoney Harada, MD  Brief HPI: 82 year old Caucasian female presented to the emergency department with one-day history of diarrhea with abdominal pain. Evaluation in the emergency department revealed colitis involving the sigmoid colon. Patient admitted to eating salad that was from a restaurant and kept in her refrigerator for more than 24 hours.  Past medical history:  Past Medical History  Diagnosis Date  . Hypertension   . Allergy   . GERD (gastroesophageal reflux disease)   . Hyperlipidemia   . Osteoporosis   . Chronic bronchitis ([place])     "lately" (06/23/2014)  . Type II diabetes mellitus ([place])   . H/O hiatal hernia   . Arthritis     "fingers and back" (06/23/2014)  . Chronic kidney disease (CKD), stage III (moderate)   . Basal cell carcinoma   . Paroxysmal a-fib ([place])   . Dysrhythmia   . CHF (congestive heart failure) ([place])     Consultants: None  Procedures: None  Antibiotics: Flagyl and azithromycin.   Subjective: Patient feels much better this morning. Diarrhea seems to have subsided. She continues to have abdominal discomfort, but not as bad as yesterday. No nausea, vomiting. Not as much blood noted in the stool yesterday as before.  Objective: Vital Signs  Filed Vitals:   01/28/16 1402 01/28/16 2145 01/29/16 0240 01/29/16 0605  BP: 120/49 141/51 133/50 140/58  Pulse: 55 73 68 66  Temp: 98.2 F (36.8 C) 100.2 F (37.9 C)  98.8 F (37.1 C)  TempSrc: Oral Oral  Oral  Resp: 18 18  17   Height:    5\' 4"  (1.626 m)  Weight:    67.949 kg (149 lb 12.8 oz)  SpO2: 95% 93%  93%    Intake/Output Summary (Last 24 hours) at 01/29/16 0854 Last data filed at 01/29/16 [id number]  Gross per 24 hour  Intake   2090 ml  Output    400 ml  Net   1690 ml   Filed Weights   01/29/16 0605  Weight: 67.949 kg (149 lb 12.8 oz)    General appearance: alert, cooperative,  appears stated age and no distress Resp: clear to auscultation bilaterally Cardio: regular rate and rhythm, S1, S2 normal, no murmur, click, rub or gallop GI: Abdomen is soft. Still tender diffusely but not as much as yesterday. No rebound, rigidity or guarding. No masses or organomegaly. Bowel sounds are present. Extremities: extremities normal, atraumatic, no cyanosis or edema Neurologic: Alert and oriented 3. Cranial nerves II-12 intact. Motor strength equal bilateral upper and lower extremities.  Lab Results:  Basic Metabolic Panel:  Recent Labs Lab 01/27/16 2218 01/28/16 1010 01/29/16 0539  NA 142 142 140  K 3.4* 2.9* 3.8  CL 108 108 111  CO2 24 27 22   GLUCOSE 151* 112* 111*  BUN 31* 28* 25*  CREATININE 1.80* 1.96* 1.99*  CALCIUM 9.4 8.9 8.2*  MG  --   --  1.8   Liver Function Tests:  Recent Labs Lab 01/27/16 2218  AST 22  ALT 22  ALKPHOS 66  BILITOT 0.5  PROT 6.3*  ALBUMIN 4.1    Recent Labs Lab 01/27/16 2218  LIPASE 24   CBC:  Recent Labs Lab 01/27/16 2218 01/28/16 1010 01/29/16 0539  WBC 8.5 8.4 8.8  HGB 12.3 11.6* 10.9*  HCT 37.0 35.0* 33.1*  MCV 84.3 83.3 86.9  PLT 171 158 160   CBG:  Recent Labs Lab  01/28/16 0748 01/28/16 1155 01/28/16 1646 01/28/16 2159 01/29/16 0748  GLUCAP 154* 127* 124* 124* 96    Recent Results (from the past 240 hour(s))  Gastrointestinal Panel by PCR , Stool     Status: None   Collection Time: 01/28/16  4:43 AM  Result Value Ref Range Status   Campylobacter species NOT DETECTED NOT DETECTED Final   Plesimonas shigelloides NOT DETECTED NOT DETECTED Final   Salmonella species NOT DETECTED NOT DETECTED Final   Yersinia enterocolitica NOT DETECTED NOT DETECTED Final   Vibrio species NOT DETECTED NOT DETECTED Final   Vibrio cholerae NOT DETECTED NOT DETECTED Final   Enteroaggregative E coli (EAEC) NOT DETECTED NOT DETECTED Final   Enteropathogenic E coli (EPEC) NOT DETECTED NOT DETECTED Final    Enterotoxigenic E coli (ETEC) NOT DETECTED NOT DETECTED Final   Shiga like toxin producing E coli (STEC) NOT DETECTED NOT DETECTED Final   E. coli O157 NOT DETECTED NOT DETECTED Final   Shigella/Enteroinvasive E coli (EIEC) NOT DETECTED NOT DETECTED Final   Cryptosporidium NOT DETECTED NOT DETECTED Final   Cyclospora cayetanensis NOT DETECTED NOT DETECTED Final   Entamoeba histolytica NOT DETECTED NOT DETECTED Final   Giardia lamblia NOT DETECTED NOT DETECTED Final   Adenovirus F40/41 NOT DETECTED NOT DETECTED Final   Astrovirus NOT DETECTED NOT DETECTED Final   Norovirus GI/GII NOT DETECTED NOT DETECTED Final   Rotavirus A NOT DETECTED NOT DETECTED Final   Sapovirus (I, II, IV, and V) NOT DETECTED NOT DETECTED Final  C difficile quick scan w PCR reflex     Status: None   Collection Time: 01/28/16  4:43 AM  Result Value Ref Range Status   C Diff antigen NEGATIVE NEGATIVE Final   C Diff toxin NEGATIVE NEGATIVE Final   C Diff interpretation Negative for toxigenic C. difficile  Final      Studies/Results: Ct Abdomen Pelvis Wo Contrast  01/28/2016  CLINICAL DATA:  Acute onset of generalized abdominal pain and diarrhea. Initial encounter. EXAM: CT ABDOMEN AND PELVIS WITHOUT CONTRAST TECHNIQUE: Multidetector CT imaging of the abdomen and pelvis was performed following the standard protocol without IV contrast. COMPARISON:  None. FINDINGS: Minimal bibasilar atelectasis or scarring is noted. Trace fluid is noted about the liver. The liver and spleen are unremarkable in appearance. A stone is noted within the gallbladder. The gallbladder is otherwise unremarkable. The pancreas and adrenal glands are unremarkable. A large right parapelvic renal cyst is noted, measuring 5.1 cm. Mild nonspecific perinephric stranding is noted bilaterally. Mild bilateral renal atrophy is seen. There is no evidence of hydronephrosis. No renal or ureteral stones are seen. No free fluid is identified. The small bowel is  unremarkable in appearance. The stomach is within normal limits. No acute vascular abnormalities are seen. Scattered calcification is noted along the abdominal aorta and its branches. The patient is status post appendectomy. Scattered diverticulosis is noted along the sigmoid colon. Wall thickening is noted along the entirety of the sigmoid colon, with associated soft tissue inflammation, concerning for segmental colitis, either infectious or inflammatory in nature. The bladder is mildly distended and grossly remarkable. The patient is status post hysterectomy. No suspicious adnexal masses are seen. The ovaries are grossly symmetric. No inguinal lymphadenopathy is seen. No acute osseous abnormalities are identified. A nonspecific sclerotic focus is noted within vertebral body L3. Multilevel vacuum phenomenon is noted along the lower thoracic and lumbar spine. IMPRESSION: 1. Wall thickening along the entirety of the sigmoid colon, with associated soft tissue inflammation,  concerning for segmental colitis, either infectious or inflammatory in nature. 2. Scattered diverticulosis along the sigmoid colon. 3. Trace ascites noted about the liver. 4. Cholelithiasis.  Gallbladder otherwise unremarkable. 5. Right renal parapelvic cyst noted. 6. Mild bilateral renal atrophy noted. 7. Scattered calcification along the abdominal aorta and its branches. Electronically Signed   By: Garald Balding M.D.   On: 01/28/2016 01:14    Medications:  Scheduled: . amiodarone  200 mg Oral Daily  . azithromycin  500 mg Oral Daily  . cloNIDine  0.2 mg Oral BID  . diltiazem  180 mg Oral Daily  . ferrous sulfate  325 mg Oral BID PC  . hydrALAZINE  100 mg Oral TID  . insulin aspart  0-9 Units Subcutaneous TID WC  . labetalol  300 mg Oral BID  . metronidazole  500 mg Intravenous Q8H  . pantoprazole  40 mg Oral Daily  . pravastatin  40 mg Oral q1800   Continuous: . sodium chloride 75 mL/hr at 01/29/16 0600   [id number],  fluticasone, morphine injection, ondansetron (ZOFRAN) IV, oxyCODONE  Assessment/Plan:  Principal Problem:   Colitis presumed infectious Active Problems:   Atrial fibrillation (HCC)   Essential hypertension   Diabetes mellitus type 2, uncontrolled, with complications (HCC)   CKD (chronic kidney disease) stage 3, GFR 30-59 ml/min   Diarrhea    Acute colitis with hematochezia Most likely infectious. Continue antibiotics as outlined above. C. difficile is negative. GI pathogen panel is negative as well. Continue to provide supportive care. Okay to advance diet. Lactic acid level is normal. Bleeding likely due to colitis. Made worse due to the fact that she is on anticoagulation. Hemoglobin is slightly lower. But bleeding appears to have subsided. Holding her anticoagulation. INR remains therapeutic. No clear indication to reverse it at this time. We will need to determine timing of last colonoscopy.  History of chronic kidney disease stage IV Renal function is at baseline. Continue to monitor urine output. Repeat labs periodically.  Hypokalemia. This was secondary to diarrhea. Potassium supplemented. Magnesium is normal.  History of atrial fibrillation Rhythm is controlled with amiodarone. Patient is also on rate limiting drugs including beta blocker, which will be continued. Patient is also on warfarin. This has been held due to episodes of rectal bleeding. INR remains therapeutic. Has not had any rectal bleeding overnight. No need to reverse her INR.  Diabetes mellitus type 2 with chronic kidney disease Monitor CBGs closely. Continue sliding scale coverage. Holding her oral agents.  DVT Prophylaxis: Was on warfarin. INR is therapeutic. Code Status: Full code.  Family Communication: Discussed with the patient and her daughter  Disposition Plan: Continue current management. Await improvement. Mobilize. PT evaluation.    LOS: 1 day   [place] [facility] Pager  [phone] 01/29/2016, 8:54 AM  If 7PM-7AM, please contact night-coverage at [email or web address], password [facility]

## 2016-01-29 NOTE — Progress Notes (Signed)
FYI Per pt's daughter, [name], pt was seen by Dr. Tillman Abide [facility] in [place], [place] Phone number there for colonoscopy results is [phone]

## 2016-01-30 LAB — BASIC METABOLIC PANEL
ANION GAP: 6 (ref 5–15)
BUN: 23 mg/dL — AB (ref 6–20)
CHLORIDE: 113 mmol/L — AB (ref 101–111)
CO2: 22 mmol/L (ref 22–32)
Calcium: 8.6 mg/dL — ABNORMAL LOW (ref 8.9–10.3)
Creatinine, Ser: 1.82 mg/dL — ABNORMAL HIGH (ref 0.44–1.00)
GFR calc Af Amer: 28 mL/min — ABNORMAL LOW (ref >60)
GFR, EST NON AFRICAN AMERICAN: 24 mL/min — AB (ref >60)
GLUCOSE: 142 mg/dL — AB (ref 65–99)
POTASSIUM: 3.9 mmol/L (ref 3.5–5.1)
Sodium: 141 mmol/L (ref 135–145)

## 2016-01-30 LAB — CBC
HEMATOCRIT: 32.2 % — AB (ref 36.0–46.0)
Hemoglobin: 10.5 g/dL — ABNORMAL LOW (ref 12.0–15.0)
MCH: 28.2 pg (ref 26.0–34.0)
MCHC: 32.6 g/dL (ref 30.0–36.0)
MCV: 86.6 fL (ref 78.0–100.0)
PLATELETS: 143 10*3/uL — AB (ref 150–400)
RBC: 3.72 MIL/uL — AB (ref 3.87–5.11)
RDW: 15.4 % (ref 11.5–15.5)
WBC: 6 10*3/uL (ref 4.0–10.5)

## 2016-01-30 LAB — PROTIME-INR
INR: 2.57 — ABNORMAL HIGH (ref 0.00–1.49)
Prothrombin Time: 26.4 seconds — ABNORMAL HIGH (ref 11.6–15.2)

## 2016-01-30 LAB — GLUCOSE, CAPILLARY
GLUCOSE-CAPILLARY: 147 mg/dL — AB (ref 65–99)
GLUCOSE-CAPILLARY: 94 mg/dL (ref 65–99)
Glucose-Capillary: 154 mg/dL — ABNORMAL HIGH (ref 65–99)
Glucose-Capillary: 175 mg/dL — ABNORMAL HIGH (ref 65–99)

## 2016-01-30 MED ORDER — FUROSEMIDE 40 MG PO TABS
40.0000 mg | ORAL_TABLET | Freq: Every day | ORAL | Status: DC
  Administered 2016-01-30 – 2016-01-31 (×2): 40 mg via ORAL
  Filled 2016-01-30 (×2): qty 1

## 2016-01-30 NOTE — Care Management Note (Signed)
Case Management Note  Patient Details  Name: Ariel Avila MRN: [id number] Date of Birth: 1930-06-04  Subjective/Objective:  82 y/o f admitted w/colitis. From home.                  Action/Plan:d/c plan home.   Expected Discharge Date:                  Expected Discharge Plan:  Home/Self Care  In-House Referral:     Discharge planning Services  CM Consult  Post Acute Care Choice:    Choice offered to:     DME Arranged:    DME Agency:     HH Arranged:    HH Agency:     Status of Service:  In process, will continue to follow  Medicare Important Message Given:    Date Medicare IM Given:    Medicare IM give by:    Date Additional Medicare IM Given:    Additional Medicare Important Message give by:     If discussed at [place] of Stay Meetings, dates discussed:    Additional Comments:  Dessa Phi, RN 01/30/2016, 3:39 PM

## 2016-01-30 NOTE — Progress Notes (Signed)
[facility] PROGRESS NOTE  Ariel Avila [id number] DOB: [birth year] DOA: 01/27/2016  PCP: Anthoney Harada, MD  Brief HPI: 82 year old Caucasian female presented to the emergency department with one-day history of diarrhea with abdominal pain. Evaluation in the emergency department revealed colitis involving the sigmoid colon. Patient admitted to eating salad that was from a restaurant and kept in her refrigerator for more than 24 hours.  Past medical history:  Past Medical History  Diagnosis Date  . Hypertension   . Allergy   . GERD (gastroesophageal reflux disease)   . Hyperlipidemia   . Osteoporosis   . Chronic bronchitis ([place])     "lately" (06/23/2014)  . Type II diabetes mellitus ([place])   . H/O hiatal hernia   . Arthritis     "fingers and back" (06/23/2014)  . Chronic kidney disease (CKD), stage III (moderate)   . Basal cell carcinoma   . Paroxysmal a-fib ([place])   . Dysrhythmia   . CHF (congestive heart failure) ([place])     Consultants: None  Procedures: None  Antibiotics: Flagyl and azithromycin.   Subjective: Patient continues to feel better. However, complains of pain in her abdomen. Denies any nausea or vomiting. Requesting advancement in diet. No further diarrhea. She has noted some blood smears on her undergarments but no perfuse bleeding.   Objective: Vital Signs  Filed Vitals:   01/29/16 0605 01/29/16 1400 01/29/16 2123 01/30/16 0519  BP: 140/58 116/47 148/56 171/62  Pulse: 66 63 61 65  Temp: 98.8 F (37.1 C) 98.4 F (36.9 C) 98.2 F (36.8 C) 98.1 F (36.7 C)  TempSrc: Oral Oral Oral Oral  Resp: 17 18 18 19   Height: 5\' 4"  (1.626 m)     Weight: 67.949 kg (149 lb 12.8 oz)     SpO2: 93% 96% 94% 94%    Intake/Output Summary (Last 24 hours) at 01/30/16 0914 Last data filed at 01/30/16 0800  Gross per 24 hour  Intake   1230 ml  Output    750 ml  Net    480 ml   Filed Weights   01/29/16 0605  Weight: 67.949 kg (149 lb 12.8 oz)     General appearance: alert, cooperative, appears stated age and no distress Resp: clear to auscultation bilaterally Cardio: regular rate and rhythm, S1, S2 normal, no murmur, click, rub or gallop GI: Abdomen is soft. Still tender diffusely but appears to be improved. No rebound, rigidity or guarding. No masses or organomegaly. Bowel sounds are present. Extremities: extremities normal, atraumatic, no cyanosis or edema Neurologic: Alert and oriented 3. Cranial nerves II-12 intact. Motor strength equal bilateral upper and lower extremities.  Lab Results:  Basic Metabolic Panel:  Recent Labs Lab 01/27/16 2218 01/28/16 1010 01/29/16 0539 01/30/16 0448  NA 142 142 140 141  K 3.4* 2.9* 3.8 3.9  CL 108 108 111 113*  CO2 24 27 22 22   GLUCOSE 151* 112* 111* 142*  BUN 31* 28* 25* 23*  CREATININE 1.80* 1.96* 1.99* 1.82*  CALCIUM 9.4 8.9 8.2* 8.6*  MG  --   --  1.8  --    Liver Function Tests:  Recent Labs Lab 01/27/16 2218  AST 22  ALT 22  ALKPHOS 66  BILITOT 0.5  PROT 6.3*  ALBUMIN 4.1    Recent Labs Lab 01/27/16 2218  LIPASE 24   CBC:  Recent Labs Lab 01/27/16 2218 01/28/16 1010 01/29/16 0539 01/30/16 0448  WBC 8.5 8.4 8.8 6.0  HGB 12.3 11.6* 10.9* 10.5*  HCT 37.0 35.0* 33.1* 32.2*  MCV 84.3 83.3 86.9 86.6  PLT 171 158 160 143*   CBG:  Recent Labs Lab 01/29/16 0748 01/29/16 1336 01/29/16 1726 01/29/16 2222 01/30/16 0741  GLUCAP 96 194* 117* 117* 147*    Recent Results (from the past 240 hour(s))  Gastrointestinal Panel by PCR , Stool     Status: None   Collection Time: 01/28/16  4:43 AM  Result Value Ref Range Status   Campylobacter species NOT DETECTED NOT DETECTED Final   Plesimonas shigelloides NOT DETECTED NOT DETECTED Final   Salmonella species NOT DETECTED NOT DETECTED Final   Yersinia enterocolitica NOT DETECTED NOT DETECTED Final   Vibrio species NOT DETECTED NOT DETECTED Final   Vibrio cholerae NOT DETECTED NOT DETECTED Final    Enteroaggregative E coli (EAEC) NOT DETECTED NOT DETECTED Final   Enteropathogenic E coli (EPEC) NOT DETECTED NOT DETECTED Final   Enterotoxigenic E coli (ETEC) NOT DETECTED NOT DETECTED Final   Shiga like toxin producing E coli (STEC) NOT DETECTED NOT DETECTED Final   E. coli O157 NOT DETECTED NOT DETECTED Final   Shigella/Enteroinvasive E coli (EIEC) NOT DETECTED NOT DETECTED Final   Cryptosporidium NOT DETECTED NOT DETECTED Final   Cyclospora cayetanensis NOT DETECTED NOT DETECTED Final   Entamoeba histolytica NOT DETECTED NOT DETECTED Final   Giardia lamblia NOT DETECTED NOT DETECTED Final   Adenovirus F40/41 NOT DETECTED NOT DETECTED Final   Astrovirus NOT DETECTED NOT DETECTED Final   Norovirus GI/GII NOT DETECTED NOT DETECTED Final   Rotavirus A NOT DETECTED NOT DETECTED Final   Sapovirus (I, II, IV, and V) NOT DETECTED NOT DETECTED Final  C difficile quick scan w PCR reflex     Status: None   Collection Time: 01/28/16  4:43 AM  Result Value Ref Range Status   C Diff antigen NEGATIVE NEGATIVE Final   C Diff toxin NEGATIVE NEGATIVE Final   C Diff interpretation Negative for toxigenic C. difficile  Final      Studies/Results: No results found.  Medications:  Scheduled: . amiodarone  200 mg Oral Daily  . azithromycin  500 mg Oral Daily  . cloNIDine  0.2 mg Oral BID  . diltiazem  180 mg Oral Daily  . ferrous sulfate  325 mg Oral BID PC  . furosemide  40 mg Oral Daily  . hydrALAZINE  100 mg Oral TID  . insulin aspart  0-9 Units Subcutaneous TID WC  . labetalol  300 mg Oral BID  . metronidazole  500 mg Intravenous Q8H  . pantoprazole  40 mg Oral Daily  . pravastatin  40 mg Oral q1800   Continuous:   [id number], fluticasone, morphine injection, ondansetron (ZOFRAN) IV, oxyCODONE  Assessment/Plan:  Principal Problem:   Colitis presumed infectious Active Problems:   Atrial fibrillation (HCC)   Essential hypertension   Diabetes mellitus type 2, uncontrolled,  with complications (HCC)   CKD (chronic kidney disease) stage 3, GFR 30-59 ml/min   Diarrhea    Acute colitis with hematochezia Most likely infectious based on history although WBC was normal. C. difficile is negative. GI pathogen panel is negative as well. Leave her on IV antibiotics for today. Advance to soft diet. Lactic acid level was normal. Bleeding likely due to colitis. Made worse due to the fact that she is on anticoagulation. Hemoglobin is slightly lower. But bleeding appears to have subsided. Holding her anticoagulation. INR remains therapeutic. No clear indication to reverse it at this time. Her last colonoscopy was  about 3 years ago at [facility]. No abnormalities were noted per patient. Patient does not need a GI evaluation at this time as it does appear that her hematochezia was due to colitis. Plan will be to hold anticoagulation until her colitis completely subsides. At that point in time, warfarin can be reinitiated and then if she has bleeding, she will need to be seen by gastroenterology.  Anemia due to acute blood loss This is mild. No significant drop in hemoglobin. No need for blood transfusions.  History of chronic kidney disease stage IV Renal function is at baseline. Continue to monitor urine output. Repeat labs periodically. Resume Lasix. Stop her IV fluids.  Hypokalemia. This was secondary to diarrhea. Potassium supplemented. Magnesium is normal.  History of atrial fibrillation Rhythm is controlled with amiodarone. Patient is also on rate limiting drugs including beta blocker, which will be continued. Patient is also on warfarin. This has been held due to episodes of rectal bleeding. INR remains therapeutic. Has not had any rectal bleeding overnight. No need to reverse her INR.  Essential hypertension Blood pressure is not well controlled. Lasix to be resumed. Continue other antihypertensive medications.  Diabetes mellitus type 2 with chronic kidney  disease Monitor CBGs closely. Continue sliding scale coverage. Holding her oral agents.  DVT Prophylaxis: Was on warfarin. INR is therapeutic. Code Status: Full code.  Family Communication: Discussed with the patient and her daughter  Disposition Plan: Continue current management. Await improvement. Mobilize. PT evaluation. Anticipate discharge 1-2 days.    LOS: 2 days   [place] [facility] Pager [phone] 01/30/2016, 9:14 AM  If 7PM-7AM, please contact night-coverage at [email or web address], password [facility]

## 2016-01-30 NOTE — Evaluation (Signed)
Physical Therapy Evaluation Patient Details Name: Ariel Avila MRN: [id number] DOB: 17-Feb-1930 Today's Date: 01/30/2016   History of Present Illness  82 y.o. female with h/o DM, HTN, COPD, a fib admitted diarrhea. Dx: colitis  Clinical Impression  Pt admitted with above diagnosis. Pt currently with functional limitations due to the deficits listed below (see PT Problem List). Pt ambulated 73' with min hand held assist for balance. Recommended pt use RW for ambulation until she returns to baseline. Abdominal pain and dizziness limited ambulation tolerance. Good progress expected.  Pt will benefit from skilled PT to increase their independence and safety with mobility to allow discharge to the venue listed below.       Follow Up Recommendations No PT follow up    Equipment Recommendations  None recommended by PT    Recommendations for Other Services       Precautions / Restrictions Precautions Precautions: None Restrictions Weight Bearing Restrictions: No      Mobility  Bed Mobility               General bed mobility comments: up in chair  Transfers Overall transfer level: Independent Equipment used: None                Ambulation/Gait Ambulation/Gait assistance: Min guard;Min assist Ambulation Distance (Feet): 70 Feet Assistive device: 1 person hand held assist Gait Pattern/deviations: Step-through pattern;Decreased step length - right;Decreased step length - left   Gait velocity interpretation: Below normal speed for age/gender General Gait Details: HHA of 1, pt also reached for handrail with other hand, recommended use of RW (pt initially declined then agreed) next time she walks, distance limited by dizziness  Stairs            Wheelchair Mobility    Modified Rankin (Stroke Patients Only)       Balance Overall balance assessment: Needs assistance   Sitting balance-Leahy Scale: Good       Standing balance-Leahy Scale: Fair                                Pertinent Vitals/Pain Pain Assessment: 0-10 Pain Score: 5  Pain Location: abdomen with standing Pain Descriptors / Indicators: Aching Pain Intervention(s): Monitored during session;Limited activity within patient's tolerance;Premedicated before session    [place] expects to be discharged to:: Private residence Living Arrangements: Spouse/significant other Available Help at Discharge: Family;Available 24 hours/day Type of Home: House Home Access: Stairs to enter   [employer] of Steps: 1 Home Layout: One level Home Equipment: Environmental consultant - 2 wheels      Prior Function Level of Independence: Independent               Hand Dominance        Extremity/Trunk Assessment   Upper Extremity Assessment: Overall WFL for tasks assessed           Lower Extremity Assessment: Overall WFL for tasks assessed      Cervical / Trunk Assessment: Normal  Communication   Communication: No difficulties  Cognition Arousal/Alertness: Awake/alert Behavior During Therapy: WFL for tasks assessed/performed Overall Cognitive Status: Within Functional Limits for tasks assessed                      General Comments      Exercises        Assessment/Plan    PT Assessment Patient needs continued PT services  PT Diagnosis  Difficulty walking;Acute pain   PT Problem List Decreased activity tolerance;Decreased balance;Decreased knowledge of use of DME;Pain  PT Treatment Interventions Gait training;DME instruction;Therapeutic activities;Patient/family education;Balance training;Therapeutic exercise   PT Goals (Current goals can be found in the Care Plan section) Acute Rehab PT Goals Patient Stated Goal: gardening, housework PT Goal Formulation: With patient/family Time For Goal Achievement: 02/13/16 Potential to Achieve Goals: Good    Frequency Min 3X/week   Barriers to discharge        Co-evaluation                End of Session Equipment Utilized During Treatment: Gait belt Activity Tolerance: Patient limited by pain;Patient limited by fatigue Patient left: in chair;with call bell/phone within reach;with family/visitor present Nurse Communication: Mobility status         Time: [id number] PT Time Calculation (min) (ACUTE ONLY): 18 min   Charges:   PT Evaluation $PT Eval Low Complexity: 1 Procedure     PT G CodesBlondell Reveal Avila 01/30/2016, 12:02 PM [phone]

## 2016-01-31 ENCOUNTER — Inpatient Hospital Stay (HOSPITAL_COMMUNITY): Payer: Medicare Other

## 2016-01-31 DIAGNOSIS — I5033 Acute on chronic diastolic (congestive) heart failure: Secondary | ICD-10-CM

## 2016-01-31 LAB — CBC
HEMATOCRIT: 32.9 % — AB (ref 36.0–46.0)
Hemoglobin: 10.7 g/dL — ABNORMAL LOW (ref 12.0–15.0)
MCH: 28 pg (ref 26.0–34.0)
MCHC: 32.5 g/dL (ref 30.0–36.0)
MCV: 86.1 fL (ref 78.0–100.0)
Platelets: 179 10*3/uL (ref 150–400)
RBC: 3.82 MIL/uL — AB (ref 3.87–5.11)
RDW: 15.6 % — AB (ref 11.5–15.5)
WBC: 6 10*3/uL (ref 4.0–10.5)

## 2016-01-31 LAB — GLUCOSE, CAPILLARY
GLUCOSE-CAPILLARY: 136 mg/dL — AB (ref 65–99)
GLUCOSE-CAPILLARY: 107 mg/dL — AB (ref 65–99)
GLUCOSE-CAPILLARY: 190 mg/dL — AB (ref 65–99)
Glucose-Capillary: 143 mg/dL — ABNORMAL HIGH (ref 65–99)

## 2016-01-31 LAB — BASIC METABOLIC PANEL
Anion gap: 6 (ref 5–15)
BUN: 19 mg/dL (ref 6–20)
CHLORIDE: 113 mmol/L — AB (ref 101–111)
CO2: 22 mmol/L (ref 22–32)
CREATININE: 1.72 mg/dL — AB (ref 0.44–1.00)
Calcium: 8.5 mg/dL — ABNORMAL LOW (ref 8.9–10.3)
GFR, EST AFRICAN AMERICAN: 30 mL/min — AB (ref >60)
GFR, EST NON AFRICAN AMERICAN: 26 mL/min — AB (ref >60)
Glucose, Bld: 149 mg/dL — ABNORMAL HIGH (ref 65–99)
POTASSIUM: 3.9 mmol/L (ref 3.5–5.1)
SODIUM: 141 mmol/L (ref 135–145)

## 2016-01-31 LAB — PROTIME-INR
INR: 2.32 — AB (ref 0.00–1.49)
PROTHROMBIN TIME: 24.5 s — AB (ref 11.6–15.2)

## 2016-01-31 MED ORDER — FUROSEMIDE 10 MG/ML IJ SOLN
40.0000 mg | Freq: Once | INTRAMUSCULAR | Status: AC
  Administered 2016-02-01: 40 mg via INTRAVENOUS
  Filled 2016-01-31: qty 4

## 2016-01-31 MED ORDER — FUROSEMIDE 10 MG/ML IJ SOLN
40.0000 mg | INTRAMUSCULAR | Status: AC
  Administered 2016-01-31: 40 mg via INTRAVENOUS
  Filled 2016-01-31: qty 4

## 2016-01-31 MED ORDER — NITROGLYCERIN 2 % TD OINT
0.5000 [in_us] | TOPICAL_OINTMENT | Freq: Four times a day (QID) | TRANSDERMAL | Status: DC
  Administered 2016-01-31 – 2016-02-01 (×2): 0.5 [in_us] via TOPICAL
  Filled 2016-01-31: qty 30

## 2016-01-31 MED ORDER — FUROSEMIDE 10 MG/ML IJ SOLN
40.0000 mg | Freq: Two times a day (BID) | INTRAMUSCULAR | Status: DC
  Administered 2016-01-31: 40 mg via INTRAVENOUS
  Filled 2016-01-31: qty 4

## 2016-01-31 MED ORDER — POTASSIUM CHLORIDE CRYS ER 20 MEQ PO TBCR
20.0000 meq | EXTENDED_RELEASE_TABLET | Freq: Once | ORAL | Status: AC
  Administered 2016-01-31: 20 meq via ORAL
  Filled 2016-01-31: qty 1

## 2016-01-31 MED ORDER — METRONIDAZOLE 500 MG PO TABS
500.0000 mg | ORAL_TABLET | Freq: Three times a day (TID) | ORAL | Status: DC
  Administered 2016-01-31 – 2016-02-02 (×7): 500 mg via ORAL
  Filled 2016-01-31 (×11): qty 1

## 2016-01-31 MED ORDER — NITROGLYCERIN 2 % TD OINT
0.5000 [in_us] | TOPICAL_OINTMENT | Freq: Four times a day (QID) | TRANSDERMAL | Status: DC
  Filled 2016-01-31: qty 30

## 2016-01-31 MED ORDER — METOPROLOL TARTRATE 1 MG/ML IV SOLN
10.0000 mg | Freq: Once | INTRAVENOUS | Status: AC
Start: 2016-01-31 — End: 2016-01-31
  Administered 2016-01-31: 10 mg via INTRAVENOUS
  Filled 2016-01-31: qty 10

## 2016-01-31 MED ORDER — MORPHINE SULFATE (PF) 2 MG/ML IV SOLN
2.0000 mg | Freq: Once | INTRAVENOUS | Status: AC
Start: 2016-01-31 — End: 2016-01-31
  Administered 2016-01-31: 2 mg via INTRAVENOUS
  Filled 2016-01-31: qty 1

## 2016-01-31 NOTE — Progress Notes (Addendum)
RN paged after pt was up to [facility] and became dyspneic with + SOB and high BP. Lasix 40mg  IV x 1, Morphine 2mg  IV x 1, Metoprolol 10mg  IV x 1, and placed on NRB. NP to bedside. S: Feels a little less SOB, but still endorses. No chest pain. Says a similar incident occurred last night as well.  O: chronically ill appearing elderly WF in mild respiratory distress. BP 210s. HR 80s. RR 26 with mild increased WOB. O2 sat normal on NRB.  A/P: 1. Flash pulmonary edema in face of CHF- monitored at bedside for awhile. CXR showed edema awaiting official read. Additional Lasix 40mg  IV given along with IV meds written above. Night dose of Clonidine given. Is improving.  BP coming down. Pt appears much better and endorses feeling better. RN to check BP/HR in 2min after additional Lasix and the Clonidine and text to me. Offered Foley, but pt refused. I don't think pt needs Bipap at this time, but if recurs, she may.  Will make sure she has BMP in am given Lasix doses.  Plan discussed with pt and her daughter who is a Therapist, sports at [employer].  KJKG, NP [facility] Update: BP still elevated. Try NTG paste.  KJKG, NP Update: BP down to 160s. Able to wean to  3.5L so far.  KJKG, NP [facility] Update: Checked on pt this am. Able to rest during night. Breathing is back to normal now. Able to wean O2 further. KJKG, NP

## 2016-01-31 NOTE — Care Management Important Message (Signed)
Important Message  Patient Details  Name: MAYZELL BANKEN MRN: [id number] Date of Birth: [birth year]   Medicare Important Message Given:  Yes    Camillo Flaming 01/31/2016, 9:28 AMImportant Message  Patient Details  Name: ROMIYAH ELK MRN: [id number] Date of Birth: 29-Mar-1930   Medicare Important Message Given:  Yes    Camillo Flaming 01/31/2016, 9:28 AM

## 2016-01-31 NOTE — Consult Note (Signed)
   [facility] CM Inpatient Consult   01/31/2016  ROSHAWNA TEWES [birth year] [id number]   Patient screened for [place] Management program services. Went to bedside to speak with patient about [facility] Care Management program. She politely declines. Left [facility] Care Management brochure at bedside if she should change her mind in the future.  Marthenia Rolling, MSN-Ed, RN,BSN [facility] Liaison [phone]

## 2016-01-31 NOTE — Progress Notes (Signed)
[facility] PROGRESS NOTE  Ariel Avila [id number] DOB: June 19, 1930 DOA: 01/27/2016  PCP: Anthoney Harada, MD  Brief HPI: 82 year old Caucasian female presented to the emergency department with one-day history of diarrhea with abdominal pain. Evaluation in the emergency department revealed colitis involving the sigmoid colon. Patient admitted to eating salad that was from a restaurant and kept in her refrigerator for more than 24 hours. Patient was started on antibiotics. She slowly started improving. On January 31, she developed mild pulmonary edema. She was given IV Lasix.  Past medical history:  Past Medical History  Diagnosis Date  . Hypertension   . Allergy   . GERD (gastroesophageal reflux disease)   . Hyperlipidemia   . Osteoporosis   . Chronic bronchitis ([place])     "lately" (06/23/2014)  . Type II diabetes mellitus ([place])   . H/O hiatal hernia   . Arthritis     "fingers and back" (06/23/2014)  . Chronic kidney disease (CKD), stage III (moderate)   . Basal cell carcinoma   . Paroxysmal a-fib ([place])   . Dysrhythmia   . CHF (congestive heart failure) ([place])     Consultants: None  Procedures: None  Antibiotics: Flagyl and azithromycin.   Subjective: Patient feels slightly short of breath this morning. Denies any chest pain. No cough. Feels a little wheezy. Abdomen is much improved. Pain is down to 4 out of 10 in intensity. Denies any further bleeding. No further diarrhea.  Objective: Vital Signs  Filed Vitals:   01/30/16 0945 01/30/16 1400 01/30/16 2045 01/31/16 0544  BP: 174/67 110/43 159/58 152/57  Pulse: 71 72 66 70  Temp: 98.6 F (37 C) 97.5 F (36.4 C) 98.7 F (37.1 C) 98.4 F (36.9 C)  TempSrc: Oral Oral Oral Oral  Resp: 18 18 18 18   Height:      Weight:      SpO2: 97% 98% 90% 90%    Intake/Output Summary (Last 24 hours) at 01/31/16 0904 Last data filed at 01/31/16 [id number]  Gross per 24 hour  Intake    360 ml  Output    400 ml  Net     -40 ml   Filed Weights   01/29/16 0605  Weight: 67.949 kg (149 lb 12.8 oz)    General appearance: alert, cooperative, appears stated age and no distress Resp: Crackles bilateral bases heard. No wheezing. Cardio: regular rate and rhythm, S1, S2 normal, no murmur, click, rub or gallop GI: Abdomen is soft. Remains tender but improved. No rebound, rigidity or guarding. No masses or organomegaly. Bowel sounds are present. Extremities: extremities normal, atraumatic, no cyanosis or edema Neurologic: Alert and oriented 3. Cranial nerves II-12 intact. Motor strength equal bilateral upper and lower extremities.  Lab Results:  Basic Metabolic Panel:  Recent Labs Lab 01/27/16 2218 01/28/16 1010 01/29/16 0539 01/30/16 0448 01/31/16 0555  NA 142 142 140 141 141  K 3.4* 2.9* 3.8 3.9 3.9  CL 108 108 111 113* 113*  CO2 24 27 22 22 22   GLUCOSE 151* 112* 111* 142* 149*  BUN 31* 28* 25* 23* 19  CREATININE 1.80* 1.96* 1.99* 1.82* 1.72*  CALCIUM 9.4 8.9 8.2* 8.6* 8.5*  MG  --   --  1.8  --   --    Liver Function Tests:  Recent Labs Lab 01/27/16 2218  AST 22  ALT 22  ALKPHOS 66  BILITOT 0.5  PROT 6.3*  ALBUMIN 4.1    Recent Labs Lab 01/27/16 2218  LIPASE 24  CBC:  Recent Labs Lab 01/27/16 2218 01/28/16 1010 01/29/16 0539 01/30/16 0448 01/31/16 0555  WBC 8.5 8.4 8.8 6.0 6.0  HGB 12.3 11.6* 10.9* 10.5* 10.7*  HCT 37.0 35.0* 33.1* 32.2* 32.9*  MCV 84.3 83.3 86.9 86.6 86.1  PLT 171 158 160 143* 179   CBG:  Recent Labs Lab 01/30/16 0741 01/30/16 1142 01/30/16 1702 01/30/16 2047 01/31/16 0747  GLUCAP 147* 154* 94 175* 143*    Recent Results (from the past 240 hour(s))  Gastrointestinal Panel by PCR , Stool     Status: None   Collection Time: 01/28/16  4:43 AM  Result Value Ref Range Status   Campylobacter species NOT DETECTED NOT DETECTED Final   Plesimonas shigelloides NOT DETECTED NOT DETECTED Final   Salmonella species NOT DETECTED NOT DETECTED Final    Yersinia enterocolitica NOT DETECTED NOT DETECTED Final   Vibrio species NOT DETECTED NOT DETECTED Final   Vibrio cholerae NOT DETECTED NOT DETECTED Final   Enteroaggregative E coli (EAEC) NOT DETECTED NOT DETECTED Final   Enteropathogenic E coli (EPEC) NOT DETECTED NOT DETECTED Final   Enterotoxigenic E coli (ETEC) NOT DETECTED NOT DETECTED Final   Shiga like toxin producing E coli (STEC) NOT DETECTED NOT DETECTED Final   E. coli O157 NOT DETECTED NOT DETECTED Final   Shigella/Enteroinvasive E coli (EIEC) NOT DETECTED NOT DETECTED Final   Cryptosporidium NOT DETECTED NOT DETECTED Final   Cyclospora cayetanensis NOT DETECTED NOT DETECTED Final   Entamoeba histolytica NOT DETECTED NOT DETECTED Final   Giardia lamblia NOT DETECTED NOT DETECTED Final   Adenovirus F40/41 NOT DETECTED NOT DETECTED Final   Astrovirus NOT DETECTED NOT DETECTED Final   Norovirus GI/GII NOT DETECTED NOT DETECTED Final   Rotavirus A NOT DETECTED NOT DETECTED Final   Sapovirus (I, II, IV, and V) NOT DETECTED NOT DETECTED Final  C difficile quick scan w PCR reflex     Status: None   Collection Time: 01/28/16  4:43 AM  Result Value Ref Range Status   C Diff antigen NEGATIVE NEGATIVE Final   C Diff toxin NEGATIVE NEGATIVE Final   C Diff interpretation Negative for toxigenic C. difficile  Final      Studies/Results: No results found.  Medications:  Scheduled: . amiodarone  200 mg Oral Daily  . azithromycin  500 mg Oral Daily  . cloNIDine  0.2 mg Oral BID  . diltiazem  180 mg Oral Daily  . ferrous sulfate  325 mg Oral BID PC  . furosemide  40 mg Oral Daily  . hydrALAZINE  100 mg Oral TID  . insulin aspart  0-9 Units Subcutaneous TID WC  . labetalol  300 mg Oral BID  . metronidazole  500 mg Intravenous Q8H  . pantoprazole  40 mg Oral Daily  . pravastatin  40 mg Oral q1800   Continuous:   [id number], fluticasone, morphine injection, ondansetron (ZOFRAN) IV,  oxyCODONE  Assessment/Plan:  Principal Problem:   Colitis presumed infectious Active Problems:   Atrial fibrillation (HCC)   Essential hypertension   Diabetes mellitus type 2, uncontrolled, with complications (HCC)   CKD (chronic kidney disease) stage 3, GFR 30-59 ml/min   Diarrhea    Acute colitis with hematochezia Most likely infectious based on history although WBC was normal. C. difficile is negative. GI pathogen panel is negative as well. Abdominal film was repeated today without any acute findings. Transition to oral Flagyl. Continue azithromycin. Continue soft diet. Lactic acid level was normal. Bleeding likely due to colitis.  Made worse due to the fact that she is on anticoagulation. Hemoglobin is slightly lower but stable. Bleeding appears to have subsided. Holding her anticoagulation. INR remains therapeutic. No clear indication to reverse it at this time. Her last colonoscopy was about 3 years ago at [facility]. No abnormalities were noted per patient. Patient does not need a GI evaluation at this time as it does appear that her hematochezia was due to colitis. Plan will be to hold anticoagulation until her colitis completely subsides. At that point in time, warfarin can be reinitiated by her PCP and then if she has bleeding, she will need to be seen by gastroenterology.  Dyspnea/acute on chronic diastolic CHF Patient does have crackles in her lungs. Chest x-ray does show mild pulmonary edema. She was started on oral Lasix yesterday. However, will need IV Lasix at least for the next 24 hours. IV fluids were discontinued yesterday. Reevaluate tomorrow morning.  Anemia due to acute blood loss This is mild and stable. No significant drop in hemoglobin. No need for blood transfusions.  History of chronic kidney disease stage IV Renal function is at baseline. Continue to monitor urine output. Repeat labs periodically. Resume Lasix.  Hypokalemia. This was secondary to diarrhea.  Potassium supplemented. Magnesium is normal.  History of atrial fibrillation Rhythm is controlled with amiodarone. Patient is also on rate limiting drugs including beta blocker, which will be continued. Patient is also on warfarin. This has been held due to episodes of rectal bleeding. INR remains therapeutic. Has not had any rectal bleeding overnight. No need to reverse her INR.  Essential hypertension Blood pressure is better than yesterday. Continue her home medication regimen.  Diabetes mellitus type 2 with chronic kidney disease Monitor CBGs closely. Continue sliding scale coverage. Holding her oral agents.  DVT Prophylaxis: Was on warfarin. INR is therapeutic. Code Status: Full code.  Family Communication: Discussed with the patient and her daughter. Updated them on test results. Disposition Plan: Slight setback today in the form of pulmonary edema/CHF. Hopefully will turn around quickly with intravenous Lasix.     LOS: 3 days   [place] [facility] Pager [phone] 01/31/2016, 9:04 AM  If 7PM-7AM, please contact night-coverage at [email or web address], password [facility]

## 2016-02-01 ENCOUNTER — Inpatient Hospital Stay (HOSPITAL_COMMUNITY): Payer: Medicare Other

## 2016-02-01 LAB — BASIC METABOLIC PANEL
ANION GAP: 11 (ref 5–15)
BUN: 19 mg/dL (ref 6–20)
CHLORIDE: 104 mmol/L (ref 101–111)
CO2: 23 mmol/L (ref 22–32)
Calcium: 8.3 mg/dL — ABNORMAL LOW (ref 8.9–10.3)
Creatinine, Ser: 1.81 mg/dL — ABNORMAL HIGH (ref 0.44–1.00)
GFR calc non Af Amer: 25 mL/min — ABNORMAL LOW (ref >60)
GFR, EST AFRICAN AMERICAN: 28 mL/min — AB (ref >60)
Glucose, Bld: 163 mg/dL — ABNORMAL HIGH (ref 65–99)
POTASSIUM: 3.4 mmol/L — AB (ref 3.5–5.1)
SODIUM: 138 mmol/L (ref 135–145)

## 2016-02-01 LAB — PROTIME-INR
INR: 1.94 — ABNORMAL HIGH (ref 0.00–1.49)
PROTHROMBIN TIME: 22.1 s — AB (ref 11.6–15.2)

## 2016-02-01 LAB — CBC
HCT: 33.1 % — ABNORMAL LOW (ref 36.0–46.0)
HEMOGLOBIN: 10.8 g/dL — AB (ref 12.0–15.0)
MCH: 27.8 pg (ref 26.0–34.0)
MCHC: 32.6 g/dL (ref 30.0–36.0)
MCV: 85.3 fL (ref 78.0–100.0)
Platelets: 186 10*3/uL (ref 150–400)
RBC: 3.88 MIL/uL (ref 3.87–5.11)
RDW: 15.3 % (ref 11.5–15.5)
WBC: 6 10*3/uL (ref 4.0–10.5)

## 2016-02-01 LAB — GLUCOSE, CAPILLARY
GLUCOSE-CAPILLARY: 166 mg/dL — AB (ref 65–99)
GLUCOSE-CAPILLARY: 185 mg/dL — AB (ref 65–99)
Glucose-Capillary: 148 mg/dL — ABNORMAL HIGH (ref 65–99)

## 2016-02-01 MED ORDER — FUROSEMIDE 40 MG PO TABS
40.0000 mg | ORAL_TABLET | Freq: Every day | ORAL | Status: DC
  Administered 2016-02-02: 40 mg via ORAL
  Filled 2016-02-01: qty 1

## 2016-02-01 MED ORDER — SODIUM CHLORIDE 0.45 % IV SOLN
INTRAVENOUS | Status: DC
  Administered 2016-02-01: 13:00:00 via INTRAVENOUS

## 2016-02-01 MED ORDER — SODIUM CHLORIDE 0.9 % IV BOLUS (SEPSIS)
500.0000 mL | Freq: Once | INTRAVENOUS | Status: AC
  Administered 2016-02-01: 500 mL via INTRAVENOUS

## 2016-02-01 MED ORDER — DEXTROSE 5 % IV SOLN
1.0000 g | INTRAVENOUS | Status: DC
  Administered 2016-02-01: 1 g via INTRAVENOUS
  Filled 2016-02-01 (×2): qty 10

## 2016-02-01 NOTE — Progress Notes (Signed)
Physical Therapy Treatment Patient Details Name: Ariel Avila MRN: [id number] DOB: 1930-08-24 Today's Date: 02/01/2016    History of Present Illness 82 y.o. female with h/o DM, HTN, COPD, a fib admitted diarrhea. Dx: colitis    PT Comments    Pt with reports of light dizziness in bed, but no change in dizziness with supine>sit> stand.  With gait, pt became very dizzy staggering with RW and reporting things blurry in front of her.  Nursing notified. Supine: BP 130/55 HR 58 Sitting: BP 119/48 HR 60 Standing BP: 102/49 HR 60 Sitting after gait: 111/50 HR 63   Follow Up Recommendations  No PT follow up;Other (comment) (as long as dizziness with gait has subsided)     Equipment Recommendations  None recommended by PT    Recommendations for Other Services       Precautions / Restrictions Precautions Precautions: Other (comment) (dizziness) Restrictions Weight Bearing Restrictions: No    Mobility  Bed Mobility Overal bed mobility: Modified Independent                Transfers   Equipment used: Rolling walker (2 wheeled)             General transfer comment: S with sit > stand, after gait and returning to bed, MIN A due to dizziness  Ambulation/Gait Ambulation/Gait assistance: Min guard;Min assist Ambulation Distance (Feet): 75 Feet Assistive device: Rolling walker (2 wheeled) Gait Pattern/deviations: Staggering left;Staggering right;Wide base of support     General Gait Details: Pt with staggering gait as gait progressed due to increase in dizziness and pt stating everything in front of her was blurry.  MIN A towards end of gait for safety til she could sit on EOB.  BP after gait 111/50 HR 63.   Stairs            Wheelchair Mobility    Modified Rankin (Stroke Patients Only)       Balance     Sitting balance-Leahy Scale: Good       Standing balance-Leahy Scale: Poor (due to dizziness today)                      Cognition  Arousal/Alertness: Awake/alert (closing eyes frequently during session, dizzy vs. fatigue) Behavior During Therapy: WFL for tasks assessed/performed Overall Cognitive Status: Within Functional Limits for tasks assessed                      Exercises      General Comments General comments (skin integrity, edema, etc.): Pt states she did walk better the other day.      Pertinent Vitals/Pain Pain Assessment: No/denies pain  Supine: BP 130/55 HR 58 Sitting: BP 119/48 HR 60 Standing BP: 102/49 HR 60 Sitting after gait: 111/50 HR 63    Home Living                      Prior Function            PT Goals (current goals can now be found in the care plan section) Acute Rehab PT Goals Patient Stated Goal: gardening, housework PT Goal Formulation: With patient/family Time For Goal Achievement: 02/13/16 Potential to Achieve Goals: Good Progress towards PT goals: Not progressing toward goals - comment (limited by dizziness today)    Frequency  Min 3X/week    PT Plan Current plan remains appropriate    Co-evaluation  End of Session Equipment Utilized During Treatment: Gait belt Activity Tolerance: Treatment limited secondary to medical complications (Comment) (dizziness) Patient left: in bed;with call bell/phone within reach     Time: 1210-1228 PT Time Calculation (min) (ACUTE ONLY): 18 min  Charges:  $Gait Training: 8-22 mins                    G Codes:      Cailean Heacock LUBECK 02/01/2016, 12:41 PM

## 2016-02-01 NOTE — Progress Notes (Signed)
Ariel Avila [id number] DOB: 1930-07-07 DOA: 01/27/2016 PCP: Anthoney Harada, MD  [facility] course since admission by Dr [place] Pink on 01/31/16  82 year old Caucasian female presented to the emergency department with one-day history of diarrhea with abdominal pain. Evaluation in the emergency department revealed colitis involving the sigmoid colon. Patient admitted to eating salad that was from a restaurant and kept in her refrigerator for more than 24 hours. Patient was started on antibiotics. She slowly started improving. On January 31, she developed mild pulmonary edema. She was given IV Lasix. Summary&Daily Progress Notes since admission 02/01/16: I have seen and examined Ariel Avila at bedside in the presence of family members and reviewed her chart. She is Orthostatic with relative hypotension, has Low grade temp, and short of breath. She appears dry. It is plausible that suggestion of congestion on chest x-ray is probably alluding to pneumonia, as this chest x-ray suggests more infiltrates on the right side where there is coarse rhonchi on exam. Zithromax may not be adequate for pneumonia. Will therefore obtain CT chest without contrast to better evaluate lung parenchyma, discontinue nitroglycerin as blood pressure borderline low, gently hydrate and add ceftriaxone in case she has pneumonia. If in fact she had frank flash pulmonary edema, question is whether she needs further cardiac workup as this could be an angina equivalent. Will consider cardiology input if If CT chest does not suggest consolidation. Patient is otherwise no longer dyspneic. She last noticed blood in stool yesterday. Will continue holding Coumadin for now as increased risk for bleeding. Appreciate pharmacy input. Problem List Plan  Principal Problem:   Colitis presumed infectious Active Problems:   Atrial fibrillation (HCC)   Essential hypertension   Diabetes mellitus type 2, uncontrolled, with complications (HCC)   CKD  (chronic kidney disease) stage 3, GFR 30-59 ml/min   Diarrhea   Discontinue nitroglycerin  Ceftriaxone  Continue Flagyl/Zithromax  Gentle fluids  Continue to hold Coumadin   Resume regular dose of Lasix in a.m.   Code Status: Full Code Family Communication: Daughters at bedside Disposition Plan: ?Home in the next 24-48 hours. Consultants:  None Procedures:  None Antibiotics:  Flagyl>>  Zithromax>>  Ceftriaxone 02/01/16>>  HPI/Subjective: Feels better. Less dyspneic.  Objective: Filed Vitals:   02/01/16 0700 02/01/16 1100  BP: 165/70 111/50  Pulse: 76   Temp: 99.1 F (37.3 C)   Resp: 20     Intake/Output Summary (Last 24 hours) at 02/01/16 1305 Last data filed at 02/01/16 0830  Gross per 24 hour  Intake    340 ml  Output   1200 ml  Net   -860 ml   Filed Weights   01/29/16 0605  Weight: 67.949 kg (149 lb 12.8 oz)    Exam:   General:  Comfortable at rest.  Cardiovascular: S1-S2 normal. No murmurs. Pulse regular.  Respiratory: Good air entry bilaterally. Coarse rhonchi right base.  Abdomen: Soft and nontender. Normal bowel sounds. No organomegaly.  Musculoskeletal: No pedal edema   Neurological: Intact  Data Reviewed: Basic Metabolic Panel:  Recent Labs Lab 01/28/16 1010 01/29/16 0539 01/30/16 0448 01/31/16 0555 02/01/16 0620  NA 142 140 141 141 138  K 2.9* 3.8 3.9 3.9 3.4*  CL 108 111 113* 113* 104  CO2 27 22 22 22 23   GLUCOSE 112* 111* 142* 149* 163*  BUN 28* 25* 23* 19 19  CREATININE 1.96* 1.99* 1.82* 1.72* 1.81*  CALCIUM 8.9 8.2* 8.6* 8.5* 8.3*  MG  --  1.8  --   --   --  Liver Function Tests:  Recent Labs Lab 01/27/16 2218  AST 22  ALT 22  ALKPHOS 66  BILITOT 0.5  PROT 6.3*  ALBUMIN 4.1    Recent Labs Lab 01/27/16 2218  LIPASE 24   No results for input(s): AMMONIA in the last 168 hours. CBC:  Recent Labs Lab 01/28/16 1010 01/29/16 0539 01/30/16 0448 01/31/16 0555 02/01/16 0620  WBC 8.4 8.8 6.0 6.0  6.0  HGB 11.6* 10.9* 10.5* 10.7* 10.8*  HCT 35.0* 33.1* 32.2* 32.9* 33.1*  MCV 83.3 86.9 86.6 86.1 85.3  PLT 158 160 143* 179 186   Cardiac Enzymes: No results for input(s): CKTOTAL, CKMB, CKMBINDEX, TROPONINI in the last 168 hours. BNP (last 3 results)  Recent Labs  10/29/15 1820  BNP 429.7*    ProBNP (last 3 results) No results for input(s): PROBNP in the last 8760 hours.  CBG:  Recent Labs Lab 01/31/16 1222 01/31/16 1702 01/31/16 2241 02/01/16 0738 02/01/16 1144  GLUCAP 136* 107* 190* 148* 166*    Recent Results (from the past 240 hour(s))  Gastrointestinal Panel by PCR , Stool     Status: None   Collection Time: 01/28/16  4:43 AM  Result Value Ref Range Status   Campylobacter species NOT DETECTED NOT DETECTED Final   Plesimonas shigelloides NOT DETECTED NOT DETECTED Final   Salmonella species NOT DETECTED NOT DETECTED Final   Yersinia enterocolitica NOT DETECTED NOT DETECTED Final   Vibrio species NOT DETECTED NOT DETECTED Final   Vibrio cholerae NOT DETECTED NOT DETECTED Final   Enteroaggregative E coli (EAEC) NOT DETECTED NOT DETECTED Final   Enteropathogenic E coli (EPEC) NOT DETECTED NOT DETECTED Final   Enterotoxigenic E coli (ETEC) NOT DETECTED NOT DETECTED Final   Shiga like toxin producing E coli (STEC) NOT DETECTED NOT DETECTED Final   E. coli O157 NOT DETECTED NOT DETECTED Final   Shigella/Enteroinvasive E coli (EIEC) NOT DETECTED NOT DETECTED Final   Cryptosporidium NOT DETECTED NOT DETECTED Final   Cyclospora cayetanensis NOT DETECTED NOT DETECTED Final   Entamoeba histolytica NOT DETECTED NOT DETECTED Final   Giardia lamblia NOT DETECTED NOT DETECTED Final   Adenovirus F40/41 NOT DETECTED NOT DETECTED Final   Astrovirus NOT DETECTED NOT DETECTED Final   Norovirus GI/GII NOT DETECTED NOT DETECTED Final   Rotavirus A NOT DETECTED NOT DETECTED Final   Sapovirus (I, II, IV, and V) NOT DETECTED NOT DETECTED Final  C difficile quick scan w PCR reflex      Status: None   Collection Time: 01/28/16  4:43 AM  Result Value Ref Range Status   C Diff antigen NEGATIVE NEGATIVE Final   C Diff toxin NEGATIVE NEGATIVE Final   C Diff interpretation Negative for toxigenic C. difficile  Final     Studies: Dg Chest Port 1 View  01/31/2016  CLINICAL DATA:  Sudden onset of kidney. History of CHF and hypertension EXAM: PORTABLE CHEST 1 VIEW COMPARISON:  01/31/2016 FINDINGS: There is mild cardiac enlargement. Aortic atherosclerosis is identified. Mild pulmonary edema is present. No airspace consolidation noted. IMPRESSION: 1. Mild congestive heart failure. 2. Aortic atherosclerosis Electronically Signed   By: Kerby Moors M.D.   On: 01/31/2016 21:54   Dg Abd Acute W/chest  01/31/2016  CLINICAL DATA:  Shortness of breath. Mid abdominal pain, abdominal distention. EXAM: DG ABDOMEN ACUTE W/ 1V CHEST COMPARISON:  10/30/2015 FINDINGS: Cardiomegaly. Bibasilar airspace opacities are noted, likely atelectasis. Small right pleural effusion. Mild vascular congestion with stable mild interstitial prominence which could reflect interstitial  edema. Nonobstructive bowel gas pattern. No free air organomegaly. Oral contrast material noted throughout the colon. IMPRESSION: Mild CHF, similar to prior study. Bibasilar atelectasis. Small right effusion. No evidence of bowel obstruction or free air. Electronically Signed   By: Rolm Baptise M.D.   On: 01/31/2016 10:35    Scheduled Meds: . amiodarone  200 mg Oral Daily  . azithromycin  500 mg Oral Daily  . cefTRIAXone (ROCEPHIN)  IV  1 g Intravenous Q24H  . cloNIDine  0.2 mg Oral BID  . diltiazem  180 mg Oral Daily  . ferrous sulfate  325 mg Oral BID PC  . hydrALAZINE  100 mg Oral TID  . insulin aspart  0-9 Units Subcutaneous TID WC  . labetalol  300 mg Oral BID  . metroNIDAZOLE  500 mg Oral 3 times per day  . pantoprazole  40 mg Oral Daily  . pravastatin  40 mg Oral q1800  . sodium chloride  500 mL Intravenous Once    Continuous Infusions:    Time spent: 25 minutes    Ariel Avila  [facility] Pager [phone]. If 7PM-7AM, please contact night-coverage at [email or web address], password [facility] 02/01/2016, 1:05 PM  LOS: 4 days

## 2016-02-01 NOTE — Significant Event (Signed)
Noted CT findings, "1. The appearance of the chest suggests congestive heart failure, as detailed above. 2. In addition, there are several small pulmonary nodules, largest and most concerning of which measures 10 x 12 mm in the left lower lobe. The possibility of a primary bronchogenic neoplasm, or metastatic disease to the lungs should be considered. Further evaluation with PET-CT is suggested after the patient's acute illness subsides. 3.Atherosclerosis, including three-vessel coronary artery disease. 4. Cholelithiasis". Will d/c fluids, and consider pulmonary/cardiology input in am.

## 2016-02-02 ENCOUNTER — Encounter (HOSPITAL_COMMUNITY): Payer: Self-pay | Admitting: Physician Assistant

## 2016-02-02 ENCOUNTER — Other Ambulatory Visit: Payer: Self-pay | Admitting: Acute Care

## 2016-02-02 ENCOUNTER — Telehealth: Payer: Self-pay | Admitting: Acute Care

## 2016-02-02 DIAGNOSIS — R911 Solitary pulmonary nodule: Secondary | ICD-10-CM

## 2016-02-02 DIAGNOSIS — I10 Essential (primary) hypertension: Secondary | ICD-10-CM

## 2016-02-02 DIAGNOSIS — A09 Infectious gastroenteritis and colitis, unspecified: Principal | ICD-10-CM

## 2016-02-02 DIAGNOSIS — I480 Paroxysmal atrial fibrillation: Secondary | ICD-10-CM

## 2016-02-02 DIAGNOSIS — N183 Chronic kidney disease, stage 3 (moderate): Secondary | ICD-10-CM

## 2016-02-02 LAB — GLUCOSE, CAPILLARY
GLUCOSE-CAPILLARY: 172 mg/dL — AB (ref 65–99)
GLUCOSE-CAPILLARY: 232 mg/dL — AB (ref 65–99)

## 2016-02-02 MED ORDER — POTASSIUM CHLORIDE CRYS ER 20 MEQ PO TBCR
40.0000 meq | EXTENDED_RELEASE_TABLET | Freq: Once | ORAL | Status: AC
  Administered 2016-02-02: 40 meq via ORAL
  Filled 2016-02-02: qty 2

## 2016-02-02 MED ORDER — AMOXICILLIN-POT CLAVULANATE 500-125 MG PO TABS: 1.0000 | ORAL_TABLET | Freq: Two times a day (BID) | ORAL | Status: DC

## 2016-02-02 MED ORDER — POTASSIUM CHLORIDE ER 10 MEQ PO TBCR: 5.0000 meq | EXTENDED_RELEASE_TABLET | Freq: Every day | ORAL | Status: DC

## 2016-02-02 MED ORDER — FUROSEMIDE 10 MG/ML IJ SOLN
40.0000 mg | Freq: Once | INTRAMUSCULAR | Status: AC
  Administered 2016-02-02: 40 mg via INTRAVENOUS
  Filled 2016-02-02: qty 4

## 2016-02-02 NOTE — Progress Notes (Signed)
Ariel Avila [id number] DOB: 10-22-1930 DOA: 01/27/2016 PCP: Anthoney Harada, MD  [facility] course since admission by Dr [place] Pink on 01/31/16  82 year old Caucasian female presented to the emergency department with one-day history of diarrhea with abdominal pain. Evaluation in the emergency department revealed colitis involving the sigmoid colon. Patient admitted to eating salad that was from a restaurant and kept in her refrigerator for more than 24 hours. Patient was started on antibiotics. She slowly started improving. On January 31, she developed mild pulmonary edema. She was given IV Lasix. Summary&Daily Progress Notes since admission 02/01/16: I have seen and examined Ariel Avila at bedside in the presence of family members and reviewed her chart. She is Orthostatic with relative hypotension, has Low grade temp, and short of breath. She appears dry. It is plausible that suggestion of congestion on chest x-ray is probably alluding to pneumonia, as this chest x-ray suggests more infiltrates on the right side where there is coarse rhonchi on exam. Zithromax may not be adequate for pneumonia. Will therefore obtain CT chest without contrast to better evaluate lung parenchyma, discontinue nitroglycerin as blood pressure borderline low, gently hydrate and add ceftriaxone in case she has pneumonia. If in fact she had frank flash pulmonary edema, question is whether she needs further cardiac workup as this could be an angina equivalent. Will consider cardiology input if If CT chest does not suggest consolidation. Patient is otherwise no longer dyspneic. She last noticed blood in stool yesterday. Will continue holding Coumadin for now as increased risk for bleeding. Appreciate pharmacy input. 629p: Noted CT findings, "1. The appearance of the chest suggests congestive heart failure, as detailed above. 2. In addition, there are several small pulmonary nodules, largest and most concerning of which measures 10  x 12 mm in the left lower lobe. The possibility of a primary bronchogenic neoplasm, or metastatic disease to the lungs should be considered. Further evaluation with PET-CT is suggested after the patient's acute illness subsides. 3.Atherosclerosis, including three-vessel coronary artery disease. 4. Cholelithiasis". Will d/c fluids, and consider pulmonary/cardiology input in am. 2//2/17: Patient felt very hot last night. Will consult cardiology/pulmonary regarding abnormal CT chest findings-? Cardiac/pulmonary workup. Otherwise we'll continue diuretics/antibiotics. Problem List Plan  Principal Problem:   Colitis presumed infectious Active Problems:   Atrial fibrillation (HCC)   Essential hypertension   Diabetes mellitus type 2, uncontrolled, with complications (HCC)   CKD (chronic kidney disease) stage 3, GFR 30-59 ml/min   Diarrhea   Continue current antibiotics   Consult cardiology/pulmonary   Code Status: Full Code Family Communication: Daughters at bedside Disposition Plan: ?Home in the next 24-48 hours. Consultants:  Cardiology  Pulmonary Procedures:  None Antibiotics:  Flagyl>>  Zithromax>>  Ceftriaxone 02/01/16>>  HPI/Subjective: Felt hot last night. Shortness of breath is better.  Objective: Filed Vitals:   02/01/16 2127 02/02/16 0621  BP: 159/62 154/61  Pulse:  16  Temp: 98.8 F (37.1 C) 98.1 F (36.7 C)  Resp: 16     Intake/Output Summary (Last 24 hours) at 02/02/16 [id number] Last data filed at 02/01/16 0830  Gross per 24 hour  Intake    240 ml  Output      0 ml  Net    240 ml   Filed Weights   01/29/16 0605  Weight: 67.949 kg (149 lb 12.8 oz)    Exam:   General:  Comfortable in recliner chair, taking a bath.  Cardiovascular: S1-S2 normal. No murmurs. Pulse regular.  Respiratory: Good air entry bilaterally. No  rhonchi or rales.  Abdomen: Soft and nontender. Normal bowel sounds. No organomegaly.  Musculoskeletal: No pedal edema    Neurological: Intact  Data Reviewed: Basic Metabolic Panel:  Recent Labs Lab 01/28/16 1010 01/29/16 0539 01/30/16 0448 01/31/16 0555 02/01/16 0620  NA 142 140 141 141 138  K 2.9* 3.8 3.9 3.9 3.4*  CL 108 111 113* 113* 104  CO2 27 22 22 22 23   GLUCOSE 112* 111* 142* 149* 163*  BUN 28* 25* 23* 19 19  CREATININE 1.96* 1.99* 1.82* 1.72* 1.81*  CALCIUM 8.9 8.2* 8.6* 8.5* 8.3*  MG  --  1.8  --   --   --    Liver Function Tests:  Recent Labs Lab 01/27/16 2218  AST 22  ALT 22  ALKPHOS 66  BILITOT 0.5  PROT 6.3*  ALBUMIN 4.1    Recent Labs Lab 01/27/16 2218  LIPASE 24   No results for input(s): AMMONIA in the last 168 hours. CBC:  Recent Labs Lab 01/28/16 1010 01/29/16 0539 01/30/16 0448 01/31/16 0555 02/01/16 0620  WBC 8.4 8.8 6.0 6.0 6.0  HGB 11.6* 10.9* 10.5* 10.7* 10.8*  HCT 35.0* 33.1* 32.2* 32.9* 33.1*  MCV 83.3 86.9 86.6 86.1 85.3  PLT 158 160 143* 179 186   Cardiac Enzymes: No results for input(s): CKTOTAL, CKMB, CKMBINDEX, TROPONINI in the last 168 hours. BNP (last 3 results)  Recent Labs  10/29/15 1820  BNP 429.7*    ProBNP (last 3 results) No results for input(s): PROBNP in the last 8760 hours.  CBG:  Recent Labs Lab 01/31/16 2241 02/01/16 0738 02/01/16 1144 02/01/16 2113 02/02/16 0732  GLUCAP 190* 148* 166* 185* 172*    Recent Results (from the past 240 hour(s))  Gastrointestinal Panel by PCR , Stool     Status: None   Collection Time: 01/28/16  4:43 AM  Result Value Ref Range Status   Campylobacter species NOT DETECTED NOT DETECTED Final   Plesimonas shigelloides NOT DETECTED NOT DETECTED Final   Salmonella species NOT DETECTED NOT DETECTED Final   Yersinia enterocolitica NOT DETECTED NOT DETECTED Final   Vibrio species NOT DETECTED NOT DETECTED Final   Vibrio cholerae NOT DETECTED NOT DETECTED Final   Enteroaggregative E coli (EAEC) NOT DETECTED NOT DETECTED Final   Enteropathogenic E coli (EPEC) NOT DETECTED NOT  DETECTED Final   Enterotoxigenic E coli (ETEC) NOT DETECTED NOT DETECTED Final   Shiga like toxin producing E coli (STEC) NOT DETECTED NOT DETECTED Final   E. coli O157 NOT DETECTED NOT DETECTED Final   Shigella/Enteroinvasive E coli (EIEC) NOT DETECTED NOT DETECTED Final   Cryptosporidium NOT DETECTED NOT DETECTED Final   Cyclospora cayetanensis NOT DETECTED NOT DETECTED Final   Entamoeba histolytica NOT DETECTED NOT DETECTED Final   Giardia lamblia NOT DETECTED NOT DETECTED Final   Adenovirus F40/41 NOT DETECTED NOT DETECTED Final   Astrovirus NOT DETECTED NOT DETECTED Final   Norovirus GI/GII NOT DETECTED NOT DETECTED Final   Rotavirus A NOT DETECTED NOT DETECTED Final   Sapovirus (I, II, IV, and V) NOT DETECTED NOT DETECTED Final  C difficile quick scan w PCR reflex     Status: None   Collection Time: 01/28/16  4:43 AM  Result Value Ref Range Status   C Diff antigen NEGATIVE NEGATIVE Final   C Diff toxin NEGATIVE NEGATIVE Final   C Diff interpretation Negative for toxigenic C. difficile  Final     Studies: Ct Chest Wo Contrast  02/01/2016  CLINICAL DATA:  82 year old female with shortness of breath. Recent diagnosis of atrial fibrillation. History of congestive heart failure. EXAM: CT CHEST WITHOUT CONTRAST TECHNIQUE: Multidetector CT imaging of the chest was performed following the standard protocol without IV contrast. COMPARISON:  No priors. FINDINGS: Mediastinum/Lymph Nodes: Heart size is mildly enlarged. There is no significant pericardial fluid, thickening or pericardial calcification. There is atherosclerosis of the thoracic aorta, the great vessels of the mediastinum and the coronary arteries, including calcified atherosclerotic plaque in the left anterior descending, left circumflex and right coronary arteries. No pathologically enlarged mediastinal or hilar lymph nodes. Multiple calcified right hilar and mediastinal lymph nodes are noted. Please note that accurate exclusion of  hilar adenopathy is limited on noncontrast CT scans. Esophagus is unremarkable in appearance. No axillary lymphadenopathy. Lungs/Pleura: 10 x 12 mm macrolobulated nodule in the posterior aspect of the superior segment of the left lower lobe (image 23 of series 5), concerning for potential neoplasm. There is also a 4 mm right upper lobe nodule (image 27 of series 5), and a 3 mm left lower lobe nodule (image 43 of series 5), both of which are nonspecific. Small bilateral pleural effusions (right greater than left). These are associated with areas of passive subsegmental atelectasis in the lower lobes of the lungs bilaterally. Patchy areas of ground-glass attenuation with some associated interlobular septal thickening noted throughout the lungs bilaterally, concerning for background of interstitial pulmonary edema. Upper Abdomen: Calcified gallstone lying dependently in the gallbladder measuring 7 mm. Musculoskeletal/Soft Tissues: There are no aggressive appearing lytic or blastic lesions noted in the visualized portions of the skeleton. IMPRESSION: 1. The appearance of the chest suggests congestive heart failure, as detailed above. 2. In addition, there are several small pulmonary nodules, largest and most concerning of which measures 10 x 12 mm in the left lower lobe. The possibility of a primary bronchogenic neoplasm, or metastatic disease to the lungs should be considered. Further evaluation with PET-CT is suggested after the patient's acute illness subsides. 3.  Atherosclerosis, including three-vessel coronary artery disease. 4. Cholelithiasis. Electronically Signed   By: Vinnie Langton M.D.   On: 02/01/2016 18:07   Dg Chest Port 1 View  01/31/2016  CLINICAL DATA:  Sudden onset of kidney. History of CHF and hypertension EXAM: PORTABLE CHEST 1 VIEW COMPARISON:  01/31/2016 FINDINGS: There is mild cardiac enlargement. Aortic atherosclerosis is identified. Mild pulmonary edema is present. No airspace  consolidation noted. IMPRESSION: 1. Mild congestive heart failure. 2. Aortic atherosclerosis Electronically Signed   By: Kerby Moors M.D.   On: 01/31/2016 21:54   Dg Abd Acute W/chest  01/31/2016  CLINICAL DATA:  Shortness of breath. Mid abdominal pain, abdominal distention. EXAM: DG ABDOMEN ACUTE W/ 1V CHEST COMPARISON:  10/30/2015 FINDINGS: Cardiomegaly. Bibasilar airspace opacities are noted, likely atelectasis. Small right pleural effusion. Mild vascular congestion with stable mild interstitial prominence which could reflect interstitial edema. Nonobstructive bowel gas pattern. No free air organomegaly. Oral contrast material noted throughout the colon. IMPRESSION: Mild CHF, similar to prior study. Bibasilar atelectasis. Small right effusion. No evidence of bowel obstruction or free air. Electronically Signed   By: Rolm Baptise M.D.   On: 01/31/2016 10:35    Scheduled Meds: . amiodarone  200 mg Oral Daily  . azithromycin  500 mg Oral Daily  . cefTRIAXone (ROCEPHIN)  IV  1 g Intravenous Q24H  . cloNIDine  0.2 mg Oral BID  . diltiazem  180 mg Oral Daily  . ferrous sulfate  325 mg Oral BID PC  .  furosemide  40 mg Oral Daily  . hydrALAZINE  100 mg Oral TID  . insulin aspart  0-9 Units Subcutaneous TID WC  . labetalol  300 mg Oral BID  . metroNIDAZOLE  500 mg Oral 3 times per day  . pantoprazole  40 mg Oral Daily  . pravastatin  40 mg Oral q1800   Continuous Infusions:    Time spent: 25 minutes    Rhealynn Myhre  [facility] Pager [phone]. If 7PM-7AM, please contact night-coverage at [email or web address], password [facility] 02/02/2016, 8:22 AM  LOS: 5 days

## 2016-02-02 NOTE — Consult Note (Signed)
Name: Ariel Avila MRN: [id number] DOB: 07-09-1930    ADMISSION DATE:  01/27/2016 CONSULTATION DATE:  2/2  REFERRING MD :  Ranga   CHIEF COMPLAINT:  Pulmonary Nodules   BRIEF PATIENT DESCRIPTION:  This is a 82 year old female who was admitted on 1/27 w/ 1d h/o diarrhea and abd pain. CT abd was c/w infectious colitis (presumed from food poisoning)and she was treated w/ IV hydration and empiric abx. On 1/31 she had an acute episode of  Dyspnea, BP 220, + JVD, treated w/ lasix, ntg and morphine. Improved w/ this. Had CT scan to further evaluate. Scan demonstrated bilateral g.g pulmonary infiltrates and small pleural effusions. But also identified small pulmonary nodules (LLL, LUL and RUL). PCCM was asked to see re: these findings.    SIGNIFICANT EVENTS  1/27 admitted w/ infectious colitis 1/31 flash edema--.treated, got better but CT chest ordered-->incidental nodules identified as well as patchy gg infiltrates and small effusions.  2/1 PCCM asked to eval re: CT findings.   STUDIES:  See below   PAST MEDICAL HISTORY :   has a past medical history of Hypertension; Allergy; GERD (gastroesophageal reflux disease); Hyperlipidemia; Osteoporosis; Chronic bronchitis ([place]); Type II diabetes mellitus ([place]); H/O hiatal hernia; Arthritis; Chronic kidney disease (CKD), stage III (moderate); Basal cell carcinoma; Paroxysmal a-fib ([place]); Dysrhythmia; and CHF (congestive heart failure) ([place]).  has past surgical history that includes Abdominal hysterectomy; Appendectomy; Excision basal cell carcinoma; and Cardioversion (N/A, 10/24/2015). Prior to Admission medications   Medication Sig Start Date End Date Taking? Authorizing Provider  acetaminophen (TYLENOL) 500 MG tablet Take 500 mg by mouth every 6 (six) hours as needed for moderate pain.   Yes Historical Provider, MD  amiodarone (PACERONE) 200 MG tablet TAKE ONE TABLET BY MOUTH ONCE DAILY 01/24/16  Yes Belva Crome, MD  cloNIDine (CATAPRES) 0.2 MG  tablet Take 1 tablet (0.2 mg total) by mouth 2 (two) times daily. 11/01/15  Yes Reyne Dumas, MD  desloratadine (CLARINEX) 5 MG tablet Take 5 mg by mouth daily.   Yes Historical Provider, MD  diltiazem (CARDIZEM CD) 180 MG 24 hr capsule Take 180 mg by mouth daily.   Yes Historical Provider, MD  ferrous sulfate 325 (65 FE) MG tablet Take 325 mg by mouth 2 (two) times daily.    Yes Historical Provider, MD  fluticasone (FLONASE) 50 MCG/ACT nasal spray Place 1 spray into both nostrils daily as needed for allergies or rhinitis.    Yes Historical Provider, MD  furosemide (LASIX) 20 MG tablet Take 40-80 mg by mouth every other day. Alternates between 40 and 80 mg every other day.   Yes Historical Provider, MD  furosemide (LASIX) 80 MG tablet Take 1 tablet (80 mg total) by mouth daily. 11/01/15  Yes Reyne Dumas, MD  glimepiride (AMARYL) 1 MG tablet Take 1 tablet (1 mg total) by mouth daily with breakfast. 12/06/14  Yes Barton Dubois, MD  hydrALAZINE (APRESOLINE) 50 MG tablet Take 100 mg by mouth 3 (three) times daily.    Yes Historical Provider, MD  labetalol (NORMODYNE) 300 MG tablet Take 300 mg by mouth 2 (two) times daily.   Yes Historical Provider, MD  lovastatin (MEVACOR) 40 MG tablet Take 1 tablet (40 mg total) by mouth at bedtime. 01/05/14  Yes Belva Crome, MD  Multiple Vitamin (MULTIVITAMIN WITH MINERALS) TABS tablet Take 1 tablet by mouth daily. One a Day Women's   Yes Historical Provider, MD  Multiple Vitamins-Minerals (HAIR/SKIN/NAILS/BIOTIN) TABS Take 1 tablet by  mouth daily.   Yes Historical Provider, MD  pantoprazole (PROTONIX) 40 MG tablet Take 40 mg by mouth daily.   Yes Historical Provider, MD  Polyvinyl Alcohol-Povidone (REFRESH OP) Place 1 drop into both eyes 2 (two) times daily as needed (for dry eyes).   Yes Historical Provider, MD  vitamin A 8000 UNIT capsule Take 8,000 Units by mouth daily.   Yes Historical Provider, MD  warfarin (COUMADIN) 2 MG tablet TAKE AS DIRECTED BY  COUMADIN   CLINIC Patient taking differently: Take 4 mg by mouth daily. With the exception of Sunday pt takes 2.5 tablets (5 mg) 11/01/15  Yes Reyne Dumas, MD   Allergies  Allergen Reactions  . Levaquin [Levofloxacin] Hives, Itching and Other (See Comments)    insomnia  . Sulfa Antibiotics Rash    FAMILY HISTORY:  family history includes Heart attack in her brother, father, and paternal grandmother; Heart disease in her brother and father; Tuberculosis in her mother. There is no history of Hypertension or Stroke. SOCIAL HISTORY:  reports that she has never smoked. She has never used smokeless tobacco. She reports that she does not drink alcohol or use illicit drugs. Never smoked  REVIEW OF SYSTEMS:   Constitutional: Negative for fever, chills, weight loss, malaise/fatigue and diaphoresis.  HENT: Negative for hearing loss, ear pain, nosebleeds, congestion, sore throat, neck pain, tinnitus and ear discharge.   Eyes: Negative for blurred vision, double vision, photophobia, pain, discharge and redness.  Respiratory: Negative for cough, hemoptysis, sputum production, shortness of breath, wheezing and stridor.  Did have acute dyspnea on 1/31 w/ hypertension and JVD after holding diuretics and getting fluid. This responded to morphine and diuretics.  Cardiovascular: Negative for chest pain, palpitations, orthopnea, claudication, leg swelling and PND.  Gastrointestinal: Negative for heartburn, nausea, vomiting, abdominal pain, diarrhea, constipation, blood in stool and melena.  Genitourinary: Negative for dysuria, urgency, frequency, hematuria and flank pain.  Musculoskeletal: Negative for myalgias, back pain, joint pain and falls.  Skin: Negative for itching and rash.  Neurological: Negative for dizziness, tingling, tremors, sensory change, speech change, focal weakness, seizures, loss of consciousness, weakness and headaches.  Endo/Heme/Allergies: Negative for environmental allergies and polydipsia. Does  not bruise/bleed easily.  SUBJECTIVE:  No distress VITAL SIGNS: Temp:  [98.1 F (36.7 C)-98.8 F (37.1 C)] 98.1 F (36.7 C) (02/02 0621) Pulse Rate:  [16] 16 (02/02 0621) Resp:  [16] 16 (02/01 2127) BP: (130-180)/(52-64) 180/64 mmHg (02/02 1017) SpO2:  [93 %-94 %] 94 % (02/02 0621)  PHYSICAL EXAMINATION: General:  82 year old white female, sitting up in chair, no distress Neuro:  Awake, alert, no focal def  HEENT:  NCAT, MMM, no JVD  Cardiovascular:  rrr w/out MRG Lungs:  Faint basilar crackles, no accessory muscle use  Abdomen:  Soft, not tender, tolerating diet  Musculoskeletal:  Equal st and bulk Skin:  Warm, brisk cr, no edema.    Recent Labs Lab 01/30/16 0448 01/31/16 0555 02/01/16 0620  NA 141 141 138  K 3.9 3.9 3.4*  CL 113* 113* 104  CO2 22 22 23   BUN 23* 19 19  CREATININE 1.82* 1.72* 1.81*  GLUCOSE 142* 149* 163*    Recent Labs Lab 01/30/16 0448 01/31/16 0555 02/01/16 0620  HGB 10.5* 10.7* 10.8*  HCT 32.2* 32.9* 33.1*  WBC 6.0 6.0 6.0  PLT 143* 179 186   Ct Chest Wo Contrast  02/01/2016  CLINICAL DATA:  82 year old female with shortness of breath. Recent diagnosis of atrial fibrillation. History of congestive  heart failure. EXAM: CT CHEST WITHOUT CONTRAST TECHNIQUE: Multidetector CT imaging of the chest was performed following the standard protocol without IV contrast. COMPARISON:  No priors. FINDINGS: Mediastinum/Lymph Nodes: Heart size is mildly enlarged. There is no significant pericardial fluid, thickening or pericardial calcification. There is atherosclerosis of the thoracic aorta, the great vessels of the mediastinum and the coronary arteries, including calcified atherosclerotic plaque in the left anterior descending, left circumflex and right coronary arteries. No pathologically enlarged mediastinal or hilar lymph nodes. Multiple calcified right hilar and mediastinal lymph nodes are noted. Please note that accurate exclusion of hilar adenopathy is  limited on noncontrast CT scans. Esophagus is unremarkable in appearance. No axillary lymphadenopathy. Lungs/Pleura: 10 x 12 mm macrolobulated nodule in the posterior aspect of the superior segment of the left lower lobe (image 23 of series 5), concerning for potential neoplasm. There is also a 4 mm right upper lobe nodule (image 27 of series 5), and a 3 mm left lower lobe nodule (image 43 of series 5), both of which are nonspecific. Small bilateral pleural effusions (right greater than left). These are associated with areas of passive subsegmental atelectasis in the lower lobes of the lungs bilaterally. Patchy areas of ground-glass attenuation with some associated interlobular septal thickening noted throughout the lungs bilaterally, concerning for background of interstitial pulmonary edema. Upper Abdomen: Calcified gallstone lying dependently in the gallbladder measuring 7 mm. Musculoskeletal/Soft Tissues: There are no aggressive appearing lytic or blastic lesions noted in the visualized portions of the skeleton. IMPRESSION: 1. The appearance of the chest suggests congestive heart failure, as detailed above. 2. In addition, there are several small pulmonary nodules, largest and most concerning of which measures 10 x 12 mm in the left lower lobe. The possibility of a primary bronchogenic neoplasm, or metastatic disease to the lungs should be considered. Further evaluation with PET-CT is suggested after the patient's acute illness subsides. 3.  Atherosclerosis, including three-vessel coronary artery disease. 4. Cholelithiasis. Electronically Signed   By: Vinnie Langton M.D.   On: 02/01/2016 18:07   Dg Chest Port 1 View  01/31/2016  CLINICAL DATA:  Sudden onset of kidney. History of CHF and hypertension EXAM: PORTABLE CHEST 1 VIEW COMPARISON:  01/31/2016 FINDINGS: There is mild cardiac enlargement. Aortic atherosclerosis is identified. Mild pulmonary edema is present. No airspace consolidation noted. IMPRESSION:  1. Mild congestive heart failure. 2. Aortic atherosclerosis Electronically Signed   By: Kerby Moors M.D.   On: 01/31/2016 21:54    ASSESSMENT / PLAN:  Pulmonary Nodules (LLL, LUL and RUL, the largest in the posterior LLL) >chronicity of these nodules are unclear  Plan Complete 5-7d course of abx (could transition to ceftin OR if plan is to d/c abx simply send home w/ ZPAK) F/u PET scan 1 mo. (ordered) F/u our office after PET scan (arranged)  Decompensated heart failure w/ pulmonary edema and pleural effusions >better w/ lasix Plan Cont regular rx  Infectious colitis  Plan Per primary  Ok for d/c from our [place] [facility] [place] Pager # [phone] OR # [phone] if no answer   02/02/2016, 12:07 PM

## 2016-02-02 NOTE — Telephone Encounter (Signed)
Per Rodena Piety:  Ariel Avila placed an order for patient to to have a Whole Body PET scan instead of Skull to Base.  According to Preston Fleeting in Scheduling stated the whole body was for Melanoma patients only. She requested that the order be changed to PET Skull to Base.  Changed order per Anita's request.  To Ariel Avila for [facility]

## 2016-02-02 NOTE — Consult Note (Signed)
CARDIOLOGY CONSULT NOTE   Patient ID: Ariel Avila MRN: [id number] DOB/AGE: 09/14/1930 82 y.o.  Admit date: 01/27/2016  Primary Physician   Anthoney Harada, MD Primary Cardiologist   Dr. Tamala Julian  Reason for Consultation   CT findings consistent with CHF Requesting Physician IM   HPI: Ariel Avila is a 82 y.o. female with a history of chronic diastolic CHF, PAF s/p unsuccessful DCCV 10/24/15 (on amiodarone, cardizem, coumadin), HTN, HLD, DM2, CKD stage IV.  Last echo 10/31/15 showed grade 2 diastolic dysfunction and EF of 60-65%. Last heart cath in 2012 in [place], per patient report, cath was normal.    Presented to ED on 01/27/16 with abdominal pain and diarrhea. In ED, patient reported lightheadedness and bright red blood in stool. Hgb was stable at 12.3, BP and HR within normal limits, low suspicion for GI bleed therefore coumadin was not reversed but was held. Lightheadedness was thought to be due to dehydration. Patient was admitted for continued abdominal pain, likely infectious colitis.   During admission, patient was given IVF for colitis and started on appropriate antibiotics. Lasix was held. On 01/31/16, patient began feeling SOB while lying in bed. Reports getting up to go to the bathroom and SOB worsened, had associated chest pain and dizziness. CXR revealed mild pulmonary edema.  Patient was given IV Lasix 40 mg.  CT of chest was obtained 02/01/16 and showed patchy areas of ground-glass attenuation with some associated interlobular septal thickening noted throughout the lungs bilaterally, concerning for background of interstitial pulmonary edema and a mildly enlarged heart consistent with CHF and also several small pulmonary nodules. Cardiology was consulted due to CT findings.  Currently, patient reports that she is back to normal. Denies SOB, chest pain, palpitations, orthopnea, PND, or dizziness. Does note that her lower legs felt a little tight. Her daughter, who is a Marine scientist  at [employer], reports that her mother looks 100% better now. Patient is typically very active and is always up moving around. Reports that before coming to ED on 1/27 with diarrhea, she had no prior episodes of SOB, DOE, orthopnea, palpitations, or CP. Does admit that she has intermittent edema of the lower extremities but performs daily weights and has been informed by her cardiologist to increase Lasix  if she goes up by 3 lbs. Patient has an appointment with Dr. Tamala Julian on 02/21/16.    Past Medical History  Diagnosis Date  . Hypertension   . Allergy   . GERD (gastroesophageal reflux disease)   . Hyperlipidemia   . Osteoporosis   . Chronic bronchitis ([place])     "lately" (06/23/2014)  . Type II diabetes mellitus ([place])   . H/O hiatal hernia   . Arthritis     "fingers and back" (06/23/2014)  . Chronic kidney disease (CKD), stage III (moderate)   . Basal cell carcinoma   . Paroxysmal a-fib ([place])   . Dysrhythmia   . CHF (congestive heart failure) [facility])      Past Surgical History  Procedure Laterality Date  . Abdominal hysterectomy    . Appendectomy    . Basal cell carcinoma excision      "face and nose"  . Cardioversion N/A 10/24/2015    Procedure: CARDIOVERSION;  Surgeon: Pixie Casino, MD;  Location: [facility] ENDOSCOPY;  Service: Cardiovascular;  Laterality: N/A;    Allergies  Allergen Reactions  . Levaquin [Levofloxacin] Hives, Itching and Other (See Comments)    insomnia  . Sulfa Antibiotics Rash    I  have reviewed the patient's current medications . amiodarone  200 mg Oral Daily  . azithromycin  500 mg Oral Daily  . cefTRIAXone (ROCEPHIN)  IV  1 g Intravenous Q24H  . cloNIDine  0.2 mg Oral BID  . diltiazem  180 mg Oral Daily  . ferrous sulfate  325 mg Oral BID PC  . furosemide  40 mg Oral Daily  . hydrALAZINE  100 mg Oral TID  . insulin aspart  0-9 Units Subcutaneous TID WC  . labetalol  300 mg Oral BID  . metroNIDAZOLE  500 mg Oral 3 times per day  . pantoprazole  40 mg Oral  Daily  . pravastatin  40 mg Oral q1800     acetaminophen, fluticasone, morphine injection, ondansetron (ZOFRAN) IV, oxyCODONE  Prior to Admission medications   Medication Sig Start Date End Date Taking? Authorizing Provider  acetaminophen (TYLENOL) 500 MG tablet Take 500 mg by mouth every 6 (six) hours as needed for moderate pain.   Yes Historical Provider, MD  amiodarone (PACERONE) 200 MG tablet TAKE ONE TABLET BY MOUTH ONCE DAILY 01/24/16  Yes Belva Crome, MD  cloNIDine (CATAPRES) 0.2 MG tablet Take 1 tablet (0.2 mg total) by mouth 2 (two) times daily. 11/01/15  Yes Reyne Dumas, MD  desloratadine (CLARINEX) 5 MG tablet Take 5 mg by mouth daily.   Yes Historical Provider, MD  diltiazem (CARDIZEM CD) 180 MG 24 hr capsule Take 180 mg by mouth daily.   Yes Historical Provider, MD  ferrous sulfate 325 (65 FE) MG tablet Take 325 mg by mouth 2 (two) times daily.    Yes Historical Provider, MD  fluticasone (FLONASE) 50 MCG/ACT nasal spray Place 1 spray into both nostrils daily as needed for allergies or rhinitis.    Yes Historical Provider, MD  furosemide (LASIX) 20 MG tablet Take 40-80 mg by mouth every other day. Alternates between 40 and 80 mg every other day.   Yes Historical Provider, MD  furosemide (LASIX) 80 MG tablet Take 1 tablet (80 mg total) by mouth daily. 11/01/15  Yes Reyne Dumas, MD  glimepiride (AMARYL) 1 MG tablet Take 1 tablet (1 mg total) by mouth daily with breakfast. 12/06/14  Yes Barton Dubois, MD  hydrALAZINE (APRESOLINE) 50 MG tablet Take 100 mg by mouth 3 (three) times daily.    Yes Historical Provider, MD  labetalol (NORMODYNE) 300 MG tablet Take 300 mg by mouth 2 (two) times daily.   Yes Historical Provider, MD  lovastatin (MEVACOR) 40 MG tablet Take 1 tablet (40 mg total) by mouth at bedtime. 01/05/14  Yes Belva Crome, MD  Multiple Vitamin (MULTIVITAMIN WITH MINERALS) TABS tablet Take 1 tablet by mouth daily. One a Day Women's   Yes Historical Provider, MD  Multiple  Vitamins-Minerals (HAIR/SKIN/NAILS/BIOTIN) TABS Take 1 tablet by mouth daily.   Yes Historical Provider, MD  pantoprazole (PROTONIX) 40 MG tablet Take 40 mg by mouth daily.   Yes Historical Provider, MD  Polyvinyl Alcohol-Povidone (REFRESH OP) Place 1 drop into both eyes 2 (two) times daily as needed (for dry eyes).   Yes Historical Provider, MD  vitamin A 8000 UNIT capsule Take 8,000 Units by mouth daily.   Yes Historical Provider, MD  warfarin (COUMADIN) 2 MG tablet TAKE AS DIRECTED BY  COUMADIN  CLINIC Patient taking differently: Take 4 mg by mouth daily. With the exception of Sunday pt takes 2.5 tablets (5 mg) 11/01/15  Yes Reyne Dumas, MD     Social History   Social  History  . Marital Status: Married    Spouse Name: N/A  . Number of Children: N/A  . Years of Education: N/A   Occupational History  . Retired    Social History Main Topics  . Smoking status: Never Smoker   . Smokeless tobacco: Never Used  . Alcohol Use: No  . Drug Use: No  . Sexual Activity: No   Other Topics Concern  . Not on file   Social History Narrative   Lives in [place]. Daughter works in [employer] at [employer].     Family Status  Relation Status Death Age  . Mother Deceased   . Father Deceased   . Brother Alive   . Maternal Grandmother Deceased   . Maternal Grandfather Deceased   . Paternal Grandmother Deceased   . Paternal Grandfather Deceased    Family History  Problem Relation Age of Onset  . Tuberculosis Mother   . Heart disease Father     bad valve  . Heart disease Brother     died at 78  . Heart attack Father   . Heart attack Paternal Grandmother   . Heart attack Brother   . Hypertension Neg Hx   . Stroke Neg Hx       ROS:  Full 14 point review of systems complete and found to be negative unless listed above.  Physical Exam: Blood pressure 180/64, pulse 16, temperature 98.1 F (36.7 C), temperature source Oral, resp. rate 16, height 5\' 4"  (1.626 m), weight 149 lb 12.8 oz (67.949  kg), SpO2 94 %.  General: Well developed, well nourished, pleasant female in no acute distress Head: Eyes PERRLA, No xanthomas. Normocephalic and atraumatic, oropharynx without edema or exudate.  Lungs: Resp regular and unlabored. Mild crackles noted in posterior bases of lungs b/l Heart: RRR no s3, s4, or murmurs.  Neck: No carotid bruits. No lymphadenopathy. No JVD. Abdomen: Bowel sounds present, abdomen soft and non-tender.  Msk:  No spine or cva tenderness. No weakness, no joint deformities or effusions. Extremities: No clubbing, cyanosis. Trace edema noted in lower extremities b/l. DP/PT/Radials 2+ and equal bilaterally. Neuro: Alert and oriented X 3. No focal deficits noted. Psych:  Good affect, responds appropriately Skin: No acute rashes or lesions noted on limited skin exam.   Labs:   Lab Results  Component Value Date   WBC 6.0 02/01/2016   HGB 10.8* 02/01/2016   HCT 33.1* 02/01/2016   MCV 85.3 02/01/2016   PLT 186 02/01/2016    Recent Labs  02/01/16 0620  INR 1.94*    Recent Labs Lab 01/27/16 2218  02/01/16 0620  NA 142  < > 138  K 3.4*  < > 3.4*  CL 108  < > 104  CO2 24  < > 23  BUN 31*  < > 19  CREATININE 1.80*  < > 1.81*  CALCIUM 9.4  < > 8.3*  PROT 6.3*  --   --   BILITOT 0.5  --   --   ALKPHOS 66  --   --   ALT 22  --   --   AST 22  --   --   GLUCOSE 151*  < > 163*  ALBUMIN 4.1  --   --   < > = values in this interval not displayed. MAGNESIUM  Date Value Ref Range Status  01/29/2016 1.8 1.7 - 2.4 mg/dL Final   LIPASE  Date/Time Value Ref Range Status  01/27/2016 10:18 PM 24 11 -  51 U/L Final    Echo 10/31/15: Study Conclusions - Left ventricle: The cavity size was normal. Wall thickness was normal. Systolic function was normal. The estimated ejection fraction was in the range of 60% to 65%. Wall motion was normal; there were no regional wall motion abnormalities. Features are consistent with a pseudonormal left ventricular filling  pattern, with concomitant abnormal relaxation and increased filling pressure (grade 2 diastolic dysfunction). - Mitral valve: There was moderate regurgitation. - Left atrium: The atrium was mildly dilated. - Pericardium, extracardiac: A trivial pericardial effusion was identified.  ECG:  01/28/2016 SR, LVH w/ repol abnormality Vent. rate 66 BPM PR interval 220 ms QRS duration 113 ms QT/QTc 518/543 ms P-R-T axes -80 -24 38 QT/QTc 498/488, 455/522, 442/497 in previous ECGs, was shorter in 2015 and before.  Tele: Sinus rhythm, HR 70 bpm.   Radiology:  Ct Chest Wo Contrast  02/01/2016  CLINICAL DATA:  82 year old female with shortness of breath. Recent diagnosis of atrial fibrillation. History of congestive heart failure. EXAM: CT CHEST WITHOUT CONTRAST TECHNIQUE: Multidetector CT imaging of the chest was performed following the standard protocol without IV contrast. COMPARISON:  No priors. FINDINGS: Mediastinum/Lymph Nodes: Heart size is mildly enlarged. There is no significant pericardial fluid, thickening or pericardial calcification. There is atherosclerosis of the thoracic aorta, the great vessels of the mediastinum and the coronary arteries, including calcified atherosclerotic plaque in the left anterior descending, left circumflex and right coronary arteries. No pathologically enlarged mediastinal or hilar lymph nodes. Multiple calcified right hilar and mediastinal lymph nodes are noted. Please note that accurate exclusion of hilar adenopathy is limited on noncontrast CT scans. Esophagus is unremarkable in appearance. No axillary lymphadenopathy. Lungs/Pleura: 10 x 12 mm macrolobulated nodule in the posterior aspect of the superior segment of the left lower lobe (image 23 of series 5), concerning for potential neoplasm. There is also a 4 mm right upper lobe nodule (image 27 of series 5), and a 3 mm left lower lobe nodule (image 43 of series 5), both of which are nonspecific. Small  bilateral pleural effusions (right greater than left). These are associated with areas of passive subsegmental atelectasis in the lower lobes of the lungs bilaterally. Patchy areas of ground-glass attenuation with some associated interlobular septal thickening noted throughout the lungs bilaterally, concerning for background of interstitial pulmonary edema. Upper Abdomen: Calcified gallstone lying dependently in the gallbladder measuring 7 mm. Musculoskeletal/Soft Tissues: There are no aggressive appearing lytic or blastic lesions noted in the visualized portions of the skeleton. IMPRESSION: 1. The appearance of the chest suggests congestive heart failure, as detailed above. 2. In addition, there are several small pulmonary nodules, largest and most concerning of which measures 10 x 12 mm in the left lower lobe. The possibility of a primary bronchogenic neoplasm, or metastatic disease to the lungs should be considered. Further evaluation with PET-CT is suggested after the patient's acute illness subsides. 3.  Atherosclerosis, including three-vessel coronary artery disease. 4. Cholelithiasis. Electronically Signed   By: Vinnie Langton M.D.   On: 02/01/2016 18:07   Dg Chest Port 1 View 01/31/2016  CLINICAL DATA:  Sudden onset of kidney. History of CHF and hypertension EXAM: PORTABLE CHEST 1 VIEW COMPARISON:  01/31/2016 FINDINGS: There is mild cardiac enlargement. Aortic atherosclerosis is identified. Mild pulmonary edema is present. No airspace consolidation noted. IMPRESSION: 1. Mild congestive heart failure. 2. Aortic atherosclerosis Electronically Signed   By: Kerby Moors M.D.   On: 01/31/2016 21:54    ASSESSMENT AND PLAN:  Principal Problem: 1. Colitis presumed infectious: per IM note coumadin can be reinitiated by PCP. Patient reports last diarrhea episode on 1/29, currently has no complaints of abdominal pain. Reports last BM was yesterday, notes that it was normal consistency and there was no  blood seen.   Active Problems: 2. Acute on chronic diastolic CHF: Lasix held for 3 days due to dehydration, pt given IVF. Progressive SOB on 01/31/16. CT findings 02/01/16 consistent for CHF with pulmonary edema. IV lasix was initiated and patient reports diuresing well. Weight today 153 (after lunch) and total output has not been documented. According to patient, her dry weight is 147 lbs. Recommend daily weight. Give 1 more dose of IV Lasix, then probably can change to PO   3. Paroxysmal Atrial fibrillation: CHA2DS2-VASc of 5 (CHF=1, HTN=1, Age>75=2, Female=1).  Currently, in sinus rhythm, HR 70s. Continue amiodarone and cardizem. Warfarin has been held due to blood in stool. Patient reports that she has not seen blood in stool now for three days. 02/01/16 INR 1.94. Reinitiate Warfarin per IM   4. Hypokalemia: K of 3.4 on 02/01/16. 2.9 on 01/28. Will supplement. Recheck value soon, as in-pt or as OP. Recommend supplement w/ 10 meq on days she takes Lasix 80 mg. Home dose alt 40 mg/80 mg.  5. Essential hypertension: last BP elevated at 180/64 before morning meds, continue to monitor.  6. Prolonged QT/QTc: Prolonged in the setting of hypokalemia, recheck when K+ supplemented  7. CKD III: followed by Dr Justin Mend. With CKD, will use minimal K+ supplement but feel she needs a little.  Plan: pt feels ready for d/c, possibly today. Will give a dose of IV Lasix and supplement K+. Can get early f/u with primary MD, dtr will make sure Dr Justin Mend aware. Keep f/u appt w/ Dr Cleophas Dunker  Signed: Lenoard Aden 02/02/2016 12:42 PM Beeper [id number]   Co-Sign MD   I have examined the patient and reviewed assessment and plan and discussed with patient.  Agree with above as stated.  Appears euvolemic.  I think she canrestart warfarin if ok with internal medicine.  F/u in teh coumadin clinic early next week.  Patient's family will set this up.  F/u with Dr. Tamala Julian as well later in February.  VARANASI,JAYADEEP S.

## 2016-02-02 NOTE — Progress Notes (Signed)
Patient given discharge instructions, and verbalized an understanding of all discharge instructions.  Patient agrees with discharge plan, and is being discharged in stable medical condition.  Patient given transportation via wheelchair. 

## 2016-02-02 NOTE — Discharge Summary (Signed)
Ariel Avila, is a 82 y.o. female  DOB 15-Apr-1930  MRN [id number].  Admission date:  01/27/2016  Admitting Physician  Etta Quill, DO  Discharge Date:  02/02/2016   Primary MD  Anthoney Harada, MD  Recommendations for primary care physician for things to follow:  Follow CT chest, cardiology follow up. Follow renal function in 1 week. Refer to GI if recurrence of bleeding.   Admission Diagnosis   Colitis [K52.9] Rectal bleeding [K62.5] Abdominal pain [R10.9]   Discharge Diagnosis  Colitis [K52.9] Rectal bleeding [K62.5] Abdominal pain [R10.9]   Principal Problem:   Colitis presumed infectious Active Problems:   Atrial fibrillation (HCC)   Essential hypertension   Diabetes mellitus type 2, uncontrolled, with complications (HCC)   CKD (chronic kidney disease) stage 3, GFR 30-59 ml/min   Diarrhea      Hospital Course  Ariel Avila is a pleasant 82 year old female presented to the emergency department with one-day history of diarrhea with abdominal pain. Evaluation in the emergency department revealed colitis involving the sigmoid colon. Patient admitted to eating salad that was from a restaurant and kept in her refrigerator for more than 24 hours hence concern for infection. Patient was started on antibiotics and she slowly improved but she developed acute shortness of breath on January 31, felt to be related to mild pulmonary edema. CT Scan of the chest raised possibility of neoplasm-?postobstructive PNA, and there was pulmonary vascular congestion. 2D Echo showed normal EF with no Wall motion abnormalities. Patient was seen by Cardiology/Pulmonary who recommended outpatient follow up, work up. She will continue antibiotics/diuretics, and will resume Coumadin tomorrow(she had some self limiting rectal  bleeding felt related to colitis)- she may need GI input if bleeding recurs. She is eager to home and will d/c to follow Cardiology/pulmonary outpatient. I discussed the discharge plan with patient's daughters. Please refer to the daily progress notes below for details of patient's hospital stay. Summary&Daily Progress Notes since admission 02/01/16: I have seen and examined Ariel Avila at bedside in the presence of family members and reviewed her chart. She is Orthostatic with relative hypotension, has Low grade temp, and short of breath. She appears dry. It is plausible that suggestion of congestion on chest x-ray is probably alluding to pneumonia, as this chest x-ray suggests more infiltrates on the right side where there is coarse rhonchi on exam. Zithromax may not be adequate for pneumonia. Will therefore obtain CT chest without contrast to better evaluate lung parenchyma, discontinue nitroglycerin as blood pressure borderline low, gently hydrate and add ceftriaxone in case she has pneumonia. If in fact she had frank flash pulmonary edema, question is whether she needs further cardiac workup as this could be an angina equivalent. Will consider cardiology input if If CT chest does not suggest consolidation. Patient is otherwise no longer dyspneic. She last noticed blood in stool yesterday. Will continue holding Coumadin for now as increased risk for bleeding. Appreciate pharmacy input. 629p: Noted CT findings, "1. The appearance of the chest suggests congestive heart  failure, as detailed above. 2. In addition, there are several small pulmonary nodules, largest and most concerning of which measures 10 x 12 mm in the left lower lobe. The possibility of a primary bronchogenic neoplasm, or metastatic disease to the lungs should be considered. Further evaluation with PET-CT is suggested after the patient's acute illness subsides. 3.Atherosclerosis, including three-vessel coronary artery disease. 4. Cholelithiasis".  Will d/c fluids, and consider pulmonary/cardiology input in am. 2//2/17: Patient felt very hot last night. Will consult cardiology/pulmonary regarding abnormal CT chest findings-? Cardiac/pulmonary workup. Otherwise we'll continue diuretics/antibiotics- Seen by cardiology and pulmonary, appreciate help. Will d/c on antibiotics and diuretics to follow Cardiology/Pulmonary outpatient.  Discharge Condition Stable.  Consults obtained  None  Follow UP Follow-up Information    Follow up with Tera Partridge, MD On 03/05/2016.   Specialty:  Pulmonary Disease   Why:  1015am    Contact information:   [address] [place] [address] [phone]         Discharge Instructions  and  Discharge Medications  Discharge Instructions    Diet - low sodium heart healthy    Complete by:  As directed      Diet Carb Modified    Complete by:  As directed      Increase activity slowly    Complete by:  As directed             Medication List    TAKE these medications        acetaminophen 500 MG tablet  Commonly known as:  TYLENOL  Take 500 mg by mouth every 6 (six) hours as needed for moderate pain.     amiodarone 200 MG tablet  Commonly known as:  PACERONE  TAKE ONE TABLET BY MOUTH ONCE DAILY     amoxicillin-clavulanate 500-125 MG tablet  Commonly known as:  AUGMENTIN  Take 1 tablet (500 mg total) by mouth 2 (two) times daily.     cloNIDine 0.2 MG tablet  Commonly known as:  CATAPRES  Take 1 tablet (0.2 mg total) by mouth 2 (two) times daily.     desloratadine 5 MG tablet  Commonly known as:  CLARINEX  Take 5 mg by mouth daily.     diltiazem 180 MG 24 hr capsule  Commonly known as:  CARDIZEM CD  Take 180 mg by mouth daily.     ferrous sulfate 325 (65 FE) MG tablet  Take 325 mg by mouth 2 (two) times daily.     fluticasone 50 MCG/ACT nasal spray  Commonly known as:  FLONASE  Place 1 spray into both nostrils daily as needed for allergies or rhinitis.      furosemide 20 MG tablet  Commonly known as:  LASIX  Take 40-80 mg by mouth every other day. Alternates between 40 and 80 mg every other day.     glimepiride 1 MG tablet  Commonly known as:  AMARYL  Take 1 tablet (1 mg total) by mouth daily with breakfast.     HAIR/SKIN/NAILS/BIOTIN Tabs  Take 1 tablet by mouth daily.     hydrALAZINE 50 MG tablet  Commonly known as:  APRESOLINE  Take 100 mg by mouth 3 (three) times daily.     labetalol 300 MG tablet  Commonly known as:  NORMODYNE  Take 300 mg by mouth 2 (two) times daily.     lovastatin 40 MG tablet  Commonly known as:  MEVACOR  Take 1 tablet (40 mg total) by mouth at  bedtime.     multivitamin with minerals Tabs tablet  Take 1 tablet by mouth daily. One a Day Women's     pantoprazole 40 MG tablet  Commonly known as:  PROTONIX  Take 40 mg by mouth daily.     potassium chloride 10 MEQ tablet  Commonly known as:  K-DUR  Take 0.5 tablets (5 mEq total) by mouth daily.     REFRESH OP  Place 1 drop into both eyes 2 (two) times daily as needed (for dry eyes).     vitamin A 8000 UNIT capsule  Take 8,000 Units by mouth daily.     warfarin 2 MG tablet  Commonly known as:  COUMADIN  TAKE AS DIRECTED BY  COUMADIN  CLINIC        Diet and Activity recommendation: See Discharge Instructions above  Major procedures and Radiology Reports - PLEASE review detailed and final reports for all details, in brief -    Ct Abdomen Pelvis Wo Contrast  01/28/2016  CLINICAL DATA:  Acute onset of generalized abdominal pain and diarrhea. Initial encounter. EXAM: CT ABDOMEN AND PELVIS WITHOUT CONTRAST TECHNIQUE: Multidetector CT imaging of the abdomen and pelvis was performed following the standard protocol without IV contrast. COMPARISON:  None. FINDINGS: Minimal bibasilar atelectasis or scarring is noted. Trace fluid is noted about the liver. The liver and spleen are unremarkable in appearance. A stone is noted within the gallbladder. The  gallbladder is otherwise unremarkable. The pancreas and adrenal glands are unremarkable. A large right parapelvic renal cyst is noted, measuring 5.1 cm. Mild nonspecific perinephric stranding is noted bilaterally. Mild bilateral renal atrophy is seen. There is no evidence of hydronephrosis. No renal or ureteral stones are seen. No free fluid is identified. The small bowel is unremarkable in appearance. The stomach is within normal limits. No acute vascular abnormalities are seen. Scattered calcification is noted along the abdominal aorta and its branches. The patient is status post appendectomy. Scattered diverticulosis is noted along the sigmoid colon. Wall thickening is noted along the entirety of the sigmoid colon, with associated soft tissue inflammation, concerning for segmental colitis, either infectious or inflammatory in nature. The bladder is mildly distended and grossly remarkable. The patient is status post hysterectomy. No suspicious adnexal masses are seen. The ovaries are grossly symmetric. No inguinal lymphadenopathy is seen. No acute osseous abnormalities are identified. A nonspecific sclerotic focus is noted within vertebral body L3. Multilevel vacuum phenomenon is noted along the lower thoracic and lumbar spine. IMPRESSION: 1. Wall thickening along the entirety of the sigmoid colon, with associated soft tissue inflammation, concerning for segmental colitis, either infectious or inflammatory in nature. 2. Scattered diverticulosis along the sigmoid colon. 3. Trace ascites noted about the liver. 4. Cholelithiasis.  Gallbladder otherwise unremarkable. 5. Right renal parapelvic cyst noted. 6. Mild bilateral renal atrophy noted. 7. Scattered calcification along the abdominal aorta and its branches. Electronically Signed   By: Garald Balding M.D.   On: 01/28/2016 01:14   Ct Chest Wo Contrast  02/01/2016  CLINICAL DATA:  82 year old female with shortness of breath. Recent diagnosis of atrial  fibrillation. History of congestive heart failure. EXAM: CT CHEST WITHOUT CONTRAST TECHNIQUE: Multidetector CT imaging of the chest was performed following the standard protocol without IV contrast. COMPARISON:  No priors. FINDINGS: Mediastinum/Lymph Nodes: Heart size is mildly enlarged. There is no significant pericardial fluid, thickening or pericardial calcification. There is atherosclerosis of the thoracic aorta, the great vessels of the mediastinum and the coronary arteries, including calcified  atherosclerotic plaque in the left anterior descending, left circumflex and right coronary arteries. No pathologically enlarged mediastinal or hilar lymph nodes. Multiple calcified right hilar and mediastinal lymph nodes are noted. Please note that accurate exclusion of hilar adenopathy is limited on noncontrast CT scans. Esophagus is unremarkable in appearance. No axillary lymphadenopathy. Lungs/Pleura: 10 x 12 mm macrolobulated nodule in the posterior aspect of the superior segment of the left lower lobe (image 23 of series 5), concerning for potential neoplasm. There is also a 4 mm right upper lobe nodule (image 27 of series 5), and a 3 mm left lower lobe nodule (image 43 of series 5), both of which are nonspecific. Small bilateral pleural effusions (right greater than left). These are associated with areas of passive subsegmental atelectasis in the lower lobes of the lungs bilaterally. Patchy areas of ground-glass attenuation with some associated interlobular septal thickening noted throughout the lungs bilaterally, concerning for background of interstitial pulmonary edema. Upper Abdomen: Calcified gallstone lying dependently in the gallbladder measuring 7 mm. Musculoskeletal/Soft Tissues: There are no aggressive appearing lytic or blastic lesions noted in the visualized portions of the skeleton. IMPRESSION: 1. The appearance of the chest suggests congestive heart failure, as detailed above. 2. In addition, there are  several small pulmonary nodules, largest and most concerning of which measures 10 x 12 mm in the left lower lobe. The possibility of a primary bronchogenic neoplasm, or metastatic disease to the lungs should be considered. Further evaluation with PET-CT is suggested after the patient's acute illness subsides. 3.  Atherosclerosis, including three-vessel coronary artery disease. 4. Cholelithiasis. Electronically Signed   By: Vinnie Langton M.D.   On: 02/01/2016 18:07   Dg Chest Port 1 View  01/31/2016  CLINICAL DATA:  Sudden onset of kidney. History of CHF and hypertension EXAM: PORTABLE CHEST 1 VIEW COMPARISON:  01/31/2016 FINDINGS: There is mild cardiac enlargement. Aortic atherosclerosis is identified. Mild pulmonary edema is present. No airspace consolidation noted. IMPRESSION: 1. Mild congestive heart failure. 2. Aortic atherosclerosis Electronically Signed   By: Kerby Moors M.D.   On: 01/31/2016 21:54   Dg Abd Acute W/chest  01/31/2016  CLINICAL DATA:  Shortness of breath. Mid abdominal pain, abdominal distention. EXAM: DG ABDOMEN ACUTE W/ 1V CHEST COMPARISON:  10/30/2015 FINDINGS: Cardiomegaly. Bibasilar airspace opacities are noted, likely atelectasis. Small right pleural effusion. Mild vascular congestion with stable mild interstitial prominence which could reflect interstitial edema. Nonobstructive bowel gas pattern. No free air organomegaly. Oral contrast material noted throughout the colon. IMPRESSION: Mild CHF, similar to prior study. Bibasilar atelectasis. Small right effusion. No evidence of bowel obstruction or free air. Electronically Signed   By: Rolm Baptise M.D.   On: 01/31/2016 10:35    Micro Results   Recent Results (from the past 240 hour(s))  Gastrointestinal Panel by PCR , Stool     Status: None   Collection Time: 01/28/16  4:43 AM  Result Value Ref Range Status   Campylobacter species NOT DETECTED NOT DETECTED Final   Plesimonas shigelloides NOT DETECTED NOT DETECTED  Final   Salmonella species NOT DETECTED NOT DETECTED Final   Yersinia enterocolitica NOT DETECTED NOT DETECTED Final   Vibrio species NOT DETECTED NOT DETECTED Final   Vibrio cholerae NOT DETECTED NOT DETECTED Final   Enteroaggregative E coli (EAEC) NOT DETECTED NOT DETECTED Final   Enteropathogenic E coli (EPEC) NOT DETECTED NOT DETECTED Final   Enterotoxigenic E coli (ETEC) NOT DETECTED NOT DETECTED Final   Shiga like toxin producing E coli (  STEC) NOT DETECTED NOT DETECTED Final   E. coli O157 NOT DETECTED NOT DETECTED Final   Shigella/Enteroinvasive E coli (EIEC) NOT DETECTED NOT DETECTED Final   Cryptosporidium NOT DETECTED NOT DETECTED Final   Cyclospora cayetanensis NOT DETECTED NOT DETECTED Final   Entamoeba histolytica NOT DETECTED NOT DETECTED Final   Giardia lamblia NOT DETECTED NOT DETECTED Final   Adenovirus F40/41 NOT DETECTED NOT DETECTED Final   Astrovirus NOT DETECTED NOT DETECTED Final   Norovirus GI/GII NOT DETECTED NOT DETECTED Final   Rotavirus A NOT DETECTED NOT DETECTED Final   Sapovirus (I, II, IV, and V) NOT DETECTED NOT DETECTED Final  C difficile quick scan w PCR reflex     Status: None   Collection Time: 01/28/16  4:43 AM  Result Value Ref Range Status   C Diff antigen NEGATIVE NEGATIVE Final   C Diff toxin NEGATIVE NEGATIVE Final   C Diff interpretation Negative for toxigenic C. difficile  Final       Today   Subjective:   Ariel Avila today has no headache,no chest abdominal pain,no new weakness tingling or numbness, feels much better wants to go home today.   Objective:   Blood pressure 130/58, pulse 16, temperature 98.1 F (36.7 C), temperature source Oral, resp. rate 16, height 5\' 4"  (1.626 m), weight 67.949 kg (149 lb 12.8 oz), SpO2 94 %.  No intake or output data in the 24 hours ending 02/02/16 1530  Exam Awake Alert, Oriented x 3, No new F.N deficits, Normal affect [place].AT,PERRAL Supple Neck,No JVD, No cervical lymphadenopathy  appriciated.  Symmetrical Chest wall movement, Good air movement bilaterally, CTAB RRR,No Gallops,Rubs or new Murmurs, No Parasternal Heave +ve B.Sounds, Abd Soft, Non tender, No organomegaly appriciated, No rebound -guarding or rigidity. No Cyanosis, Clubbing or edema, No new Rash or bruise  Data Review   CBC w Diff:  Lab Results  Component Value Date   WBC 6.0 02/01/2016   HGB 10.8* 02/01/2016   HCT 33.1* 02/01/2016   PLT 186 02/01/2016   LYMPHOPCT 10 10/29/2015   MONOPCT 7 10/29/2015   EOSPCT 2 10/29/2015   BASOPCT 0 10/29/2015    CMP:  Lab Results  Component Value Date   NA 138 02/01/2016   K 3.4* 02/01/2016   CL 104 02/01/2016   CO2 23 02/01/2016   BUN 19 02/01/2016   CREATININE 1.81* 02/01/2016   CREATININE 1.99* 10/25/2015   PROT 6.3* 01/27/2016   ALBUMIN 4.1 01/27/2016   BILITOT 0.5 01/27/2016   ALKPHOS 66 01/27/2016   AST 22 01/27/2016   ALT 22 01/27/2016  .   Total Time in preparing paper work, data evaluation and todays exam - 35 minutes  Brittinie Wherley M.D on 02/02/2016 at 3:30 PM  [place]  [phone]

## 2016-02-09 ENCOUNTER — Ambulatory Visit (INDEPENDENT_AMBULATORY_CARE_PROVIDER_SITE_OTHER): Payer: Medicare Other | Admitting: *Deleted

## 2016-02-09 DIAGNOSIS — R911 Solitary pulmonary nodule: Secondary | ICD-10-CM | POA: Diagnosis not present

## 2016-02-09 DIAGNOSIS — I5032 Chronic diastolic (congestive) heart failure: Secondary | ICD-10-CM | POA: Diagnosis not present

## 2016-02-09 DIAGNOSIS — I10 Essential (primary) hypertension: Secondary | ICD-10-CM | POA: Diagnosis not present

## 2016-02-09 DIAGNOSIS — I480 Paroxysmal atrial fibrillation: Secondary | ICD-10-CM | POA: Diagnosis not present

## 2016-02-09 DIAGNOSIS — N189 Chronic kidney disease, unspecified: Secondary | ICD-10-CM | POA: Diagnosis not present

## 2016-02-09 DIAGNOSIS — I482 Chronic atrial fibrillation: Secondary | ICD-10-CM | POA: Diagnosis not present

## 2016-02-09 DIAGNOSIS — Z5181 Encounter for therapeutic drug level monitoring: Secondary | ICD-10-CM

## 2016-02-09 DIAGNOSIS — I4891 Unspecified atrial fibrillation: Secondary | ICD-10-CM

## 2016-02-09 DIAGNOSIS — E785 Hyperlipidemia, unspecified: Secondary | ICD-10-CM | POA: Diagnosis not present

## 2016-02-09 DIAGNOSIS — Z7901 Long term (current) use of anticoagulants: Secondary | ICD-10-CM | POA: Diagnosis not present

## 2016-02-09 DIAGNOSIS — E119 Type 2 diabetes mellitus without complications: Secondary | ICD-10-CM | POA: Diagnosis not present

## 2016-02-09 DIAGNOSIS — E876 Hypokalemia: Secondary | ICD-10-CM | POA: Diagnosis not present

## 2016-02-09 LAB — POCT INR: INR: 2

## 2016-02-14 ENCOUNTER — Telehealth: Payer: Self-pay | Admitting: *Deleted

## 2016-02-14 NOTE — Telephone Encounter (Signed)
Attempted to contact patient to reschedule with Dr. Elsworth Soho. Awaiting call back from patient.

## 2016-02-14 NOTE — Telephone Encounter (Signed)
-----   Message from Rigoberto Noel, MD sent at 02/06/2016 11:41 AM EST ----- Regarding: RE: pet scan  I can get her in [place], can you pl change her appt  To me in 75month after PET scan please? 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 Then incidentally found LLL nodule, LUL and RUL nodule. The LLL looked concerning so Ruby Cola Had me order PET scan for a month out. I think the scan results may be called to [facility], but the appointment for f/u is w/ Creig Hines a few days after the PET.  Just wanted to give you a heads up  -[place]

## 2016-02-21 ENCOUNTER — Encounter: Payer: Self-pay | Admitting: Interventional Cardiology

## 2016-02-21 ENCOUNTER — Ambulatory Visit (INDEPENDENT_AMBULATORY_CARE_PROVIDER_SITE_OTHER): Payer: Medicare Other

## 2016-02-21 ENCOUNTER — Ambulatory Visit (INDEPENDENT_AMBULATORY_CARE_PROVIDER_SITE_OTHER): Payer: Medicare Other | Admitting: Interventional Cardiology

## 2016-02-21 VITALS — BP 154/70 | HR 62 | Ht 64.0 in | Wt 146.6 lb

## 2016-02-21 DIAGNOSIS — Z5181 Encounter for therapeutic drug level monitoring: Secondary | ICD-10-CM | POA: Diagnosis not present

## 2016-02-21 DIAGNOSIS — Z79899 Other long term (current) drug therapy: Secondary | ICD-10-CM | POA: Insufficient documentation

## 2016-02-21 DIAGNOSIS — I4891 Unspecified atrial fibrillation: Secondary | ICD-10-CM | POA: Diagnosis not present

## 2016-02-21 DIAGNOSIS — I480 Paroxysmal atrial fibrillation: Secondary | ICD-10-CM

## 2016-02-21 DIAGNOSIS — I10 Essential (primary) hypertension: Secondary | ICD-10-CM

## 2016-02-21 DIAGNOSIS — I5032 Chronic diastolic (congestive) heart failure: Secondary | ICD-10-CM | POA: Diagnosis not present

## 2016-02-21 LAB — POCT INR: INR: 2

## 2016-02-21 NOTE — Progress Notes (Signed)
Cardiology Office Note   Date:  02/21/2016   ID:  ANNAMARIA VITTONE, DOB 08-07-1930, MRN [id number]  PCP:  Anthoney Harada, MD  Cardiologist:  Sinclair Grooms, MD   Chief Complaint  Patient presents with  . Congestive Heart Failure      History of Present Illness: Ariel Avila is a 82 y.o. female who presents for  Follow-up of recurrent acute diastolic heart failure, atrial fibrillation, amiodarone therapy, hypertension, chronic kidney disease III, hyperlipidemia, and CAD (diffuse calcification on CT)   The patient had a recent  Hospital stay for collitis. This was complicated by some blood in her stool.  She did not have difficulty with chest discomfort but on the second or third day of the hospital stay she suddenly developed dyspnea. Her diuretics  therapy was held on admission. IV fluids were given.  The acute CHF exacerbation was a consequence of both. She is not back to her baseline weight at home , 144 pounds. The diuretic regimen has been altered by multiple physicians. She is currently on 40 mg alternated with 60 mg. There was no atrial fibrillation during the most recent hospital stay.  Past Medical History  Diagnosis Date  . Hypertension   . Allergy   . GERD (gastroesophageal reflux disease)   . Hyperlipidemia   . Osteoporosis   . Chronic bronchitis ([place])     "lately" (06/23/2014)  . Type II diabetes mellitus ([place])   . H/O hiatal hernia   . Arthritis     "fingers and back" (06/23/2014)  . Chronic kidney disease (CKD), stage III (moderate)   . Basal cell carcinoma   . Paroxysmal a-fib ([place])   . Dysrhythmia   . CHF (congestive heart failure) [facility])     Past Surgical History  Procedure Laterality Date  . Abdominal hysterectomy    . Appendectomy    . Basal cell carcinoma excision      "face and nose"  . Cardioversion N/A 10/24/2015    Procedure: CARDIOVERSION;  Surgeon: Pixie Casino, MD;  Location: [facility] ENDOSCOPY;  Service: Cardiovascular;  Laterality: N/A;      Current Outpatient Prescriptions  Medication Sig Dispense Refill  . acetaminophen (TYLENOL) 500 MG tablet Take 500 mg by mouth every 6 (six) hours as needed for moderate pain.    Marland Kitchen amiodarone (PACERONE) 200 MG tablet TAKE ONE TABLET BY MOUTH ONCE DAILY 90 tablet 0  . cloNIDine (CATAPRES) 0.2 MG tablet Take 1 tablet (0.2 mg total) by mouth 2 (two) times daily. 60 tablet 0  . desloratadine (CLARINEX) 5 MG tablet Take 5 mg by mouth daily.    Marland Kitchen diltiazem (CARDIZEM CD) 180 MG 24 hr capsule Take 180 mg by mouth daily.    . ferrous sulfate 325 (65 FE) MG tablet Take 325 mg by mouth 2 (two) times daily.     . fluticasone (FLONASE) 50 MCG/ACT nasal spray Place 1 spray into both nostrils daily as needed for allergies or rhinitis.     . furosemide (LASIX) 20 MG tablet Take 40-80 mg by mouth every other day. Alternates between 40 and 80 mg every other day.    Marland Kitchen glimepiride (AMARYL) 1 MG tablet Take 1 tablet (1 mg total) by mouth daily with breakfast. 30 tablet 1  . hydrALAZINE (APRESOLINE) 50 MG tablet Take 100 mg by mouth 3 (three) times daily.     Marland Kitchen labetalol (NORMODYNE) 300 MG tablet Take 300 mg by mouth 2 (two) times daily.    Marland Kitchen  lovastatin (MEVACOR) 40 MG tablet Take 1 tablet (40 mg total) by mouth at bedtime. 30 tablet 10  . Multiple Vitamin (MULTIVITAMIN WITH MINERALS) TABS tablet Take 1 tablet by mouth daily. One a Day Women's    . Multiple Vitamins-Minerals (HAIR/SKIN/NAILS/BIOTIN) TABS Take 1 tablet by mouth daily.    . pantoprazole (PROTONIX) 40 MG tablet Take 40 mg by mouth daily.    . Polyvinyl Alcohol-Povidone (REFRESH OP) Place 1 drop into both eyes 2 (two) times daily as needed (for dry eyes).    . potassium chloride (K-DUR) 10 MEQ tablet Take 0.5 tablets (5 mEq total) by mouth daily. 30 tablet 0  . vitamin A 8000 UNIT capsule Take 8,000 Units by mouth daily.    Marland Kitchen warfarin (COUMADIN) 2 MG tablet TAKE AS DIRECTED BY  COUMADIN  CLINIC (Patient taking differently: Take 4 mg by mouth  daily. With the exception of Sunday pt takes 2.5 tablets (5 mg)) 75 tablet 3   No current facility-administered medications for this visit.    Allergies:   Levaquin and Sulfa antibiotics    Social History:  The patient  reports that she has never smoked. She has never used smokeless tobacco. She reports that she does not drink alcohol or use illicit drugs.   Family History:  The patient's family history includes Heart attack in her brother, father, and paternal grandmother; Heart disease in her brother and father; Tuberculosis in her mother. There is no history of Hypertension or Stroke.    ROS:  Please see the history of present illness.   Otherwise, review of systems are positive for  Blood in stool has resolved. Rare chest discomfort. Rare ankle edema..   All other systems are reviewed and negative.    PHYSICAL EXAM: VS:  BP 154/70 mmHg  Pulse 62  Ht 5\' 4"  (1.626 m)  Wt 146 lb 9.6 oz (66.497 kg)  BMI 25.15 kg/m2 , BMI Body mass index is 25.15 kg/(m^2). GEN: Well nourished, well developed, in no acute distress HEENT: normal Neck: no JVD, carotid bruits, or masses Cardiac: RRR.  There is no murmur, rub, or gallop. There is no edema. Respiratory:  clear to auscultation bilaterally, normal work of breathing. GI: soft, nontender, nondistended, + BS MS: no deformity or atrophy Skin: warm and dry, no rash Neuro:  Strength and sensation are intact Psych: euthymic mood, full affect   EKG:  EKG is not ordered today.   Recent Labs: 10/25/2015: TSH 3.311 10/29/2015: B Natriuretic Peptide 429.7* 01/27/2016: ALT 22 01/29/2016: Magnesium 1.8 02/01/2016: BUN 19; Creatinine, Ser 1.81*; Hemoglobin 10.8*; Platelets 186; Potassium 3.4*; Sodium 138    Lipid Panel No results found for: CHOL, TRIG, HDL, CHOLHDL, VLDL, LDLCALC, LDLDIRECT    Wt Readings from Last 3 Encounters:  02/21/16 146 lb 9.6 oz (66.497 kg)  01/29/16 149 lb 12.8 oz (67.949 kg)  11/15/15 148 lb 6.4 oz (67.314 kg)       Other studies Reviewed: Additional studies/ records that were reviewed today include:  The recent hospital stay was reviewed. The findings include  Clear evidence of volume overload on chest x-ray and CT scan. CT scan also identified a pulmonary nodule that will require PET scan follow-up. This is been arranged by pulmonology..    ASSESSMENT AND PLAN:  1. Paroxysmal atrial fibrillation (HCC)  currently stable on amiodarone  2. Essential hypertension  borderline elevated systolic pressure  3. Chronic diastolic heart failure (HCC)  no evidence of volume overload. Dry weight at home is 144  pounds.  4. Encounter for therapeutic drug monitoring  Coumadin clinic visit will occurred today.   5. Amiodarone therapy  Will monitor chronically as outpatient no change in current dose.    Current medicines are reviewed at length with the patient today.  The patient has the following concerns regarding medicines:  Her daughter, [name] and I discussed amiodarone dosing. Currently the plan is to maintain 200 mg per day and follow closely..  The following changes/actions have been instituted:    TSH and hepatic panel on return visit  Visits need to be twice yearly  Low-salt diet. Home scale dry weight 144 pounds.   low-salt diet  Labs/ tests ordered today include:  No orders of the defined types were placed in this encounter.     Disposition:   FU with HS in 6 months  Signed, Sinclair Grooms, MD  02/21/2016 12:58 PM    [address] [place], [place], [place]  [address] Phone: [phone]; Fax: [phone]

## 2016-02-21 NOTE — Patient Instructions (Signed)
Medication Instructions:  Your physician recommends that you continue on your current medications as directed. Please refer to the Current Medication list given to you today.   Labwork: Your physician recommends that you return for lab work in: 6 months (Tsh,Hepatic)  Testing/Procedures: None ordered  Follow-Up: Your physician wants you to follow-up in: 6 months with Dr.Smith You will receive a reminder letter in the mail two months in advance. If you don't receive a letter, please call our office to schedule the follow-up appointment.   Any Other Special Instructions Will Be Listed Below (If Applicable).     If you need a refill on your cardiac medications before your next appointment, please call your pharmacy.

## 2016-02-22 NOTE — Telephone Encounter (Signed)
Left message for patient to call back to schedule appointment with Dr. Elsworth Soho, ok to double book after March 2nd for PET scan results.

## 2016-02-23 ENCOUNTER — Telehealth: Payer: Self-pay | Admitting: Pulmonary Disease

## 2016-02-23 NOTE — Telephone Encounter (Signed)
Per 02/14/16 phone note: Glean Hess, CMA at 02/14/2016 5:28 PM     Status: Signed       Expand All Collapse All   ----- Message from Rigoberto Noel, MD sent at 02/06/2016 11:41 AM EST ----- Regarding: RE: pet scan  I can get her in [place], can you pl change her appt To me in 5month after PET scan please? RA ----- Message -----  From: Erick Colace, NP  Sent: 02/02/2016 1:02 PM  To: Rigoberto Noel, MD, Javier Glazier, MD Subject: pet scan   Ferman Hamming I think she is your patient but came in for GI issues and CT scan completed while zebra hunting (hospitalist). Then incidentally found LLL nodule, LUL and RUL nodule. The LLL looked concerning so Ruby Cola Had me order PET scan for a month out. I think the scan results may be called to [facility], but the appointment for f/u is w/ Creig Hines a few days after the PET.  Just wanted to give you a heads up  -[place] with daughter. Pt scheduled for PET scan 3/2 and HFU with JN on 3/3. Daughter is going to be out of town after 3/3 and can't come in for apt with RA on 03/06/16. They are wanting to know why patient can't keep scheduled appt with JN on 3/3 since this works for them? Please advise Dr. Karmen Stabs thanks

## 2016-02-23 NOTE — Telephone Encounter (Signed)
Spoke with pt's daughter.  Appt with JN on 3/3 was already rescheduled per previous phone note when nurse spoke with pt and was rescheduled to RA.  Appt scheduled with pt's daughter with JN for 3/2 at 3:15 to discuss results/HFU.  Daughter has to work on 03/01/16.  If she is able to find coverage for appt on 3/2, she will cancel the appt with RA on 3/7.  If she is not able to find coverage, she will call back to advise further.  Nothing further needed at this time.  Will sign off.

## 2016-02-23 NOTE — Telephone Encounter (Signed)
Patient's appointment has been changed from Dr. Ammie Dalton schedule to Dr. Elsworth Soho per Dr. Bari Mantis request. Patient aware of appointment. Nothing further needed.

## 2016-02-23 NOTE — Telephone Encounter (Signed)
OK to keep appt with [facility]

## 2016-03-01 ENCOUNTER — Encounter (HOSPITAL_COMMUNITY): Payer: Medicare Other

## 2016-03-01 ENCOUNTER — Inpatient Hospital Stay: Payer: Medicare Other | Admitting: Pulmonary Disease

## 2016-03-02 ENCOUNTER — Inpatient Hospital Stay: Payer: Medicare Other | Admitting: Pulmonary Disease

## 2016-03-05 ENCOUNTER — Inpatient Hospital Stay: Payer: Medicare Other | Admitting: Pulmonary Disease

## 2016-03-06 ENCOUNTER — Inpatient Hospital Stay: Payer: Medicare Other | Admitting: Pulmonary Disease

## 2016-03-16 ENCOUNTER — Ambulatory Visit (INDEPENDENT_AMBULATORY_CARE_PROVIDER_SITE_OTHER): Payer: Medicare Other

## 2016-03-16 DIAGNOSIS — Z5181 Encounter for therapeutic drug level monitoring: Secondary | ICD-10-CM | POA: Diagnosis not present

## 2016-03-16 DIAGNOSIS — I480 Paroxysmal atrial fibrillation: Secondary | ICD-10-CM | POA: Diagnosis not present

## 2016-03-16 DIAGNOSIS — I4891 Unspecified atrial fibrillation: Secondary | ICD-10-CM

## 2016-03-16 LAB — POCT INR: INR: 2.5

## 2016-03-24 DIAGNOSIS — H8110 Benign paroxysmal vertigo, unspecified ear: Secondary | ICD-10-CM | POA: Diagnosis not present

## 2016-03-29 DIAGNOSIS — H6981 Other specified disorders of Eustachian tube, right ear: Secondary | ICD-10-CM | POA: Diagnosis not present

## 2016-03-29 DIAGNOSIS — R42 Dizziness and giddiness: Secondary | ICD-10-CM | POA: Diagnosis not present

## 2016-04-02 ENCOUNTER — Other Ambulatory Visit: Payer: Self-pay | Admitting: Family Medicine

## 2016-04-02 ENCOUNTER — Ambulatory Visit
Admission: RE | Admit: 2016-04-02 | Discharge: 2016-04-02 | Disposition: A | Discharge status: Home / Self Care | Payer: Medicare Other | Source: Ambulatory Visit | Attending: Family Medicine | Admitting: Family Medicine

## 2016-04-02 DIAGNOSIS — N189 Chronic kidney disease, unspecified: Secondary | ICD-10-CM | POA: Diagnosis not present

## 2016-04-02 DIAGNOSIS — R030 Elevated blood-pressure reading, without diagnosis of hypertension: Secondary | ICD-10-CM

## 2016-04-02 DIAGNOSIS — R42 Dizziness and giddiness: Secondary | ICD-10-CM

## 2016-04-02 DIAGNOSIS — I10 Essential (primary) hypertension: Secondary | ICD-10-CM | POA: Diagnosis not present

## 2016-04-02 DIAGNOSIS — J310 Chronic rhinitis: Secondary | ICD-10-CM | POA: Diagnosis not present

## 2016-04-02 DIAGNOSIS — IMO0001 Reserved for inherently not codable concepts without codable children: Secondary | ICD-10-CM

## 2016-04-02 DIAGNOSIS — I5032 Chronic diastolic (congestive) heart failure: Secondary | ICD-10-CM | POA: Diagnosis not present

## 2016-04-02 DIAGNOSIS — I482 Chronic atrial fibrillation: Secondary | ICD-10-CM | POA: Diagnosis not present

## 2016-04-02 DIAGNOSIS — H9209 Otalgia, unspecified ear: Secondary | ICD-10-CM | POA: Diagnosis not present

## 2016-04-02 DIAGNOSIS — H9201 Otalgia, right ear: Secondary | ICD-10-CM | POA: Diagnosis not present

## 2016-04-04 ENCOUNTER — Ambulatory Visit: Payer: Medicare Other | Attending: Family Medicine | Admitting: Rehabilitative and Restorative Service Providers"

## 2016-04-04 DIAGNOSIS — R2689 Other abnormalities of gait and mobility: Secondary | ICD-10-CM | POA: Insufficient documentation

## 2016-04-04 DIAGNOSIS — R42 Dizziness and giddiness: Secondary | ICD-10-CM | POA: Insufficient documentation

## 2016-04-04 NOTE — Patient Instructions (Signed)
Tip Card 1.The goal of habituation training is to assist in decreasing symptoms of vertigo, dizziness, or nausea provoked by specific head and body motions. 2.These exercises may initially increase symptoms; however, be persistent and work through symptoms. With repetition and time, the exercises will assist in reducing or eliminating symptoms. 3.Exercises should be stopped and discussed with the therapist if you experience any of the following: - Sudden change or fluctuation in hearing - New onset of ringing in the ears, or increase in current intensity - Any fluid discharge from the ear - Severe pain in neck or back - Extreme nausea  Copyright  VHI. All rights reserved.  Rolling   *Begin on your back with head of your bed slightly raised.  Slowly turn your head to the right side.  Hold this position until any dizziness stops + 20 seconds.  Move your head back to middle.  Turn your head to the left and hold until any dizziness stops + 20 seconds.  Copyright  VHI. All rights reserved.   FOR SAFETY: 1) Be sure to have family present when you do the exercises. 2) Have a bag nearby in case you get nausea (you can move slow and stop if you feel nausea). 3) Plan to be able to sit for 15 minutes after the exercise (go to the bathroom, have a remote, have water).  Be safe with walking after this exercise *[facility] [address] [place], [place]  [address] Phone:  [phone] Fax:  [phone]

## 2016-04-05 NOTE — Therapy (Signed)
[facility] [address] [place], [place], [address] Phone: [phone]   Fax:  [phone]  Physical Therapy Evaluation  Patient Details  Name: Ariel Avila MRN: [id number] Date of Birth: [birth year] Referring Provider: Kathyrn Lass, MD  Encounter Date: 04/04/2016      PT End of Session - 04/05/16 2211    Visit Number 1   Number of Visits 8   Date for PT Re-Evaluation 05/05/16   Authorization Type G code due every 10 visits   PT Start Time 1020   PT Stop Time 1105   PT Time Calculation (min) 45 min   Activity Tolerance Other (comment)  Limited by nausea and held further assessment   Behavior During Therapy [facility] for tasks assessed/performed      Past Medical History  Diagnosis Date  . Hypertension   . Allergy   . GERD (gastroesophageal reflux disease)   . Hyperlipidemia   . Osteoporosis   . Chronic bronchitis ([place])     "lately" (06/23/2014)  . Type II diabetes mellitus ([place])   . H/O hiatal hernia   . Arthritis     "fingers and back" (06/23/2014)  . Chronic kidney disease (CKD), stage III (moderate)   . Basal cell carcinoma   . Paroxysmal a-fib ([place])   . Dysrhythmia   . CHF (congestive heart failure) [facility])     Past Surgical History  Procedure Laterality Date  . Abdominal hysterectomy    . Appendectomy    . Basal cell carcinoma excision      "face and nose"  . Cardioversion N/A 10/24/2015    Procedure: CARDIOVERSION;  Surgeon: Pixie Casino, MD;  Location: [facility] ENDOSCOPY;  Service: Cardiovascular;  Laterality: N/A;    There were no vitals filed for this visit.       Subjective Assessment - 04/04/16 2258    Subjective The patient reports onset of dizziness upon waking 2 weeks ago reporting a sensation of "I can't walk".  She reports that she got a spinning sensation when rolliing to the right side.  She reports 2 falls over the past couple of weeks with onset of dizziness stating "my ear closed up".   She has not used a RW in the past and began using a RW since onset of vertigo.  She denies vision changes, she does have intermittent headaches.     Patient is accompained by: Family member   Pertinent History No prior h/o dizziness.   Patient Stated Goals Get rid of plugged up ear and improve balance.             [facility] PT Assessment - 04/04/16 2201    Assessment   Medical Diagnosis vertigo/dizziness   Onset Date/Surgical Date --  two weeks ago 02/2016   Prior Therapy none   Precautions   Precautions Fall   Balance Screen   Has the patient fallen in the past 6 months Yes   How many times? 2   Has the patient had a decrease in activity level because of a fear of falling?  Yes   Is the patient reluctant to leave their home because of a fear of falling?  Yes   [place] residence   Living Arrangements Spouse/significant other  kids help as needed   Type of [place] Access Level entry   Home Layout One level   [place] - 2 wheels   Prior Function  Level of Independence Independent   Observation/Other Assessments   Focus on Therapeutic Outcomes (FOTO)  41%   Other Surveys  --  DHI=60%   Ambulation/Gait   Ambulation/Gait Yes   Ambulation/Gait Assistance 5: Supervision   Ambulation Distance (Feet) 70 Feet   Assistive device Rolling walker  lightweight   Gait Pattern Decreased stride length  slowed pace with instability noted   Ambulation Surface Level   Gait Comments Further gait assessment to follow as vertigo treated.            Vestibular Assessment - 04/04/16 2202    Vestibular Assessment   General Observation Patient unsteadiness with standing.   Symptom Behavior   Type of Dizziness Spinning  unsteadiness   Frequency of Dizziness daily   Duration of Dizziness initially constant   Aggravating Factors Activity in general  rolling to the right   Relieving Factors Head stationary   Occulomotor Exam    Occulomotor Alignment Normal   Spontaneous Absent   Gaze-induced Absent   Smooth Pursuits Saccades  noted only with vertical tracking   Saccades Dysmetria  Patient overshoots in vertical plane and corrects to target   Comment I can't look up or lie down on the back of my head.    Patient reported this began 82 years ago.    Vestibulo-Occular Reflex   VOR 1 Head Only (x 1 viewing) Provokes mild dizziness at self regulated pace.   Comment Head impulse test positive bilaterally with corrective saccade R and L noted.  Patient experiences greater dizziness with L HIT.   Positional Testing   Dix-Hallpike Dix-Hallpike Right;Dix-Hallpike Left   Horizontal Canal Testing Horizontal Canal Right;Horizontal Canal Left   Dix-Hallpike Right   Dix-Hallpike Right Duration No dizziness moving long sitting to R dix hallpike and no nystagmus, dizziness described as spinning with return to sitting (no nystagmus able to be viewed as patient closes eyes)(   Dix-Hallpike Right Symptoms No nystagmus   Dix-Hallpike Left   Dix-Hallpike Left Duration sensation of dizziness   Dix-Hallpike Left Symptoms No nystagmus   Horizontal Canal Right   Horizontal Canal Right Duration provokes nausea   Horizontal Canal Right Symptoms Nystagmus  Mild, unable to differentiate direction in room light   Horizontal Canal Left   Horizontal Canal Left Duration provokes nausea, held further testing                Vestibular Treatment/Exercise - 04/05/16 0001    Vestibular Treatment/Exercise   Vestibular Treatment Provided --  Patient demo'd self tx she has been performing.     provided HEP for habituation.          PT Education - 04/05/16 2208    Education provided Yes   Education Details HEP: Habituation modified due to nausea with head motion only (no rolling), discussed safety for performing in the home.   Person(s) Educated Patient;Child(ren)   Methods Explanation;Demonstration;Handout   Comprehension  Verbalized understanding;Returned demonstration             PT Long Term Goals - 04/05/16 2213    PT LONG TERM GOAL #1   Title The patient will be able to perform HEP with assist from family for habituation, gaze adaptation, balance and mobility.   Baseline Target date 05/03/2016   Time 4   Period Weeks   PT LONG TERM GOAL #2   Title The patient will reduce DHI from 60% to < or equal to 45% to demo decreased self perception of vertigo.  Baseline Target date 05/03/2016   Time 4   Period Weeks   PT LONG TERM GOAL #3   Title The patient will tolerate supine<>bilateral sidelying without subjective c/o dizziness for improved bed mobility/functional tasks.   Baseline Target date 05/03/2016   Time 4   Period Weeks   PT LONG TERM GOAL #4   Title The patient will be further assessed on Berg and goal to follow.   Baseline Target date 05/03/2016   Time 4   Period Weeks   PT LONG TERM GOAL #5   Title The patient will be furthe assessed on gait speed and goal to follow, as indicated.   Baseline Target date 05/03/2016   Time 4   Period Weeks               Plan - Apr 12, 2016 2225    Clinical Impression Statement The patient is an 82 year old female presenting to physical therapy today with acute onset of vertigo 2 weeks ago.  During today's evaluation, she presents with multiple impairments that could be contributing to clinical presentiation including positional sensitivities worse with horizontal canal testing, diminished VOR for gaze stabilization viewed with positive bilateral head impulse test, and imbalance related to recent onset of vertigo.  The patient also had abnormal saccadic testing in vertical plane with overshooting noted.  If patient does not respond to treatment for peripheral issues, she may benefit from further neurologic workup.  Today's evaluation was limited due to nausea with patient reporting anything further would provoke vomitting.  PT to progress to patient tolerance.     Rehab Potential Good   Clinical Impairments Affecting Rehab Potential Patient appears to have good family support.   PT Frequency 2x / week   PT Duration 4 weeks   PT Treatment/Interventions ADLs/Self Care Home Management;Balance training;Neuromuscular re-education;Gait training;Canalith Repostioning;Functional mobility training;Therapeutic activities;Therapeutic exercise;Vestibular;Patient/family education   PT Next Visit Plan Check habituation horizontal head turn, progress to horizontal roll.  Gaze, canolith repositioning, gait speed, Berg.   Consulted and Agree with Plan of Care Patient;Family member/caregiver   Family Member Consulted daughter present and patient requested/provided permission for PT to speak with daughter Godfrey Pick via telephone to discuss findings.      Patient will benefit from skilled therapeutic intervention in order to improve the following deficits and impairments:  Abnormal gait, Dizziness, Difficulty walking, Decreased mobility, Decreased balance  Visit Diagnosis: Other abnormalities of gait and mobility  Dizziness and giddiness      G-Codes - 04/12/16 2256    Functional Assessment Tool Used DHI=60%, using a RW   Functional Limitation Mobility: Walking and moving around   Mobility: Walking and Moving Around Current Status [phone]) At least 40 percent but less than 60 percent impaired, limited or restricted   Mobility: Walking and Moving Around Goal Status [phone]) At least 20 percent but less than 40 percent impaired, limited or restricted       Problem List Patient Active Problem List   Diagnosis Date Noted  . On amiodarone therapy 02/21/2016  . Acute on chronic diastolic heart failure ([place]) 10/29/2015  . Persistent atrial fibrillation ([place])   . Nausea vomiting and diarrhea 12/04/2014  . Hypoglycemia 12/03/2014  . Diabetes mellitus type 2, controlled ([place]) 12/03/2014  . CKD (chronic kidney disease) stage 3, GFR 30-59 ml/min 12/03/2014  .  Hiatal hernia 12/03/2014  . GERD (gastroesophageal reflux disease) 12/03/2014  . Bronchitis, acute, with bronchospasm 06/23/2014  . Encounter for therapeutic drug monitoring 04/12/2014  . Atrial  fibrillation ([place]) 11/18/2013  . Essential hypertension 11/18/2013  . Hyperlipidemia 11/18/2013  . Diabetes mellitus type 2, uncontrolled, with complications ([place]) [id number]  . Hyperaldosteronism ([place]) 11/18/2013  . Chronic diastolic heart failure ([place]) 11/18/2013    Class: Chronic    Jiah Bari, PT 04/05/2016, 10:58 PM  [facility] [address] [place], [place], [address] Phone: [phone]   Fax:  [phone]  Name: NIYA ARNOUX MRN: [id number] Date of Birth: [birth year]

## 2016-04-09 ENCOUNTER — Inpatient Hospital Stay: Payer: Medicare Other | Admitting: Pulmonary Disease

## 2016-04-09 DIAGNOSIS — N189 Chronic kidney disease, unspecified: Secondary | ICD-10-CM | POA: Diagnosis not present

## 2016-04-09 DIAGNOSIS — H9209 Otalgia, unspecified ear: Secondary | ICD-10-CM | POA: Diagnosis not present

## 2016-04-09 DIAGNOSIS — R42 Dizziness and giddiness: Secondary | ICD-10-CM | POA: Diagnosis not present

## 2016-04-09 DIAGNOSIS — J310 Chronic rhinitis: Secondary | ICD-10-CM | POA: Diagnosis not present

## 2016-04-09 DIAGNOSIS — I5032 Chronic diastolic (congestive) heart failure: Secondary | ICD-10-CM | POA: Diagnosis not present

## 2016-04-09 DIAGNOSIS — H9201 Otalgia, right ear: Secondary | ICD-10-CM | POA: Diagnosis not present

## 2016-04-09 DIAGNOSIS — I10 Essential (primary) hypertension: Secondary | ICD-10-CM | POA: Diagnosis not present

## 2016-04-09 DIAGNOSIS — I482 Chronic atrial fibrillation: Secondary | ICD-10-CM | POA: Diagnosis not present

## 2016-04-11 ENCOUNTER — Ambulatory Visit: Payer: Medicare Other | Admitting: Rehabilitative and Restorative Service Providers"

## 2016-04-11 DIAGNOSIS — R2689 Other abnormalities of gait and mobility: Secondary | ICD-10-CM | POA: Diagnosis not present

## 2016-04-11 DIAGNOSIS — R42 Dizziness and giddiness: Secondary | ICD-10-CM

## 2016-04-11 NOTE — Patient Instructions (Signed)
Tip Card 1.The goal of habituation training is to assist in decreasing symptoms of vertigo, dizziness, or nausea provoked by specific head and body motions. 2.These exercises may initially increase symptoms; however, be persistent and work through symptoms. With repetition and time, the exercises will assist in reducing or eliminating symptoms. 3.Exercises should be stopped and discussed with the therapist if you experience any of the following: - Sudden change or fluctuation in hearing - New onset of ringing in the ears, or increase in current intensity - Any fluid discharge from the ear - Severe pain in neck or back - Extreme nausea  Copyright  VHI. All rights reserved.  Rolling   With pillow under head, start on back. Roll to your right side.  Hold until dizziness stops, plus 20 seconds and then roll to the left side.  Hold until dizziness stops, plus 20 seconds.  Repeat sequence 5 times per session. Do 2 sessions per day.  Copyright  VHI. All rights reserved.  Gaze Stabilization: Tip Card 1.Target must remain in focus, not blurry, and appear stationary while head is in motion. 2.Perform exercises with small head movements (45 to either side of midline). 3.Increase speed of head motion so long as target is in focus. 4.If you wear eyeglasses, be sure you can see target through lens (therapist will give specific instructions for bifocal / progressive lenses). 5.These exercises may provoke dizziness or nausea. Work through these symptoms. If too dizzy, slow head movement slightly. Rest between each exercise. 6.Exercises demand concentration; avoid distractions. 7.For safety, perform standing exercises close to a counter, wall, corner, or next to someone.  Copyright  VHI. All rights reserved.  Gaze Stabilization: Standing Feet Apart   Begin sitting.  Keep eyes on target on wall 3 feet away, tilt head down slightly and move head side to side for 30 seconds. Repeat while moving head up and  down for 30 seconds. Do 2 sessions per day.   Copyright  VHI. All rights reserved.   SINGLE LIMB STANCE    STAND NEAR THE COUNTERTOP FOR SUPPORT.  Stance: single leg on floor. Raise leg. Hold _10 __ seconds. Repeat with other leg. _3__ reps per set, 1-2 TIMES/DAY.   Copyright  VHI. All rights reserved.   Feet Partial Heel-Toe, Varied Arm Positions - Eyes Closed    Stand with right foot partially in front of the other and arms out. Close eyes and visualize upright position. Hold __20__ seconds. Repeat ___3_ times with each foot forward per session. Do __2__ sessions per day.  Copyright  VHI. All rights reserved.

## 2016-04-11 NOTE — Therapy (Signed)
[facility] [address] [place], [place], [address] Phone: [phone]   Fax:  [phone]  Physical Therapy Treatment  Patient Details  Name: Ariel Avila MRN: [id number] Date of Birth: Sep 25, 1930 Referring Provider: Kathyrn Lass, MD  Encounter Date: 04/11/2016      PT End of Session - 04/11/16 1712    Visit Number 2   Number of Visits 8   Date for PT Re-Evaluation 05/05/16   Authorization Type G code due every 10 visits   PT Start Time 1402   PT Stop Time 1448   PT Time Calculation (min) 46 min   Equipment Utilized During Treatment Gait belt   Activity Tolerance Patient tolerated treatment well   Behavior During Therapy [facility] for tasks assessed/performed      Past Medical History  Diagnosis Date  . Hypertension   . Allergy   . GERD (gastroesophageal reflux disease)   . Hyperlipidemia   . Osteoporosis   . Chronic bronchitis ([place])     "lately" (06/23/2014)  . Type II diabetes mellitus ([place])   . H/O hiatal hernia   . Arthritis     "fingers and back" (06/23/2014)  . Chronic kidney disease (CKD), stage III (moderate)   . Basal cell carcinoma   . Paroxysmal a-fib ([place])   . Dysrhythmia   . CHF (congestive heart failure) [facility])     Past Surgical History  Procedure Laterality Date  . Abdominal hysterectomy    . Appendectomy    . Basal cell carcinoma excision      "face and nose"  . Cardioversion N/A 10/24/2015    Procedure: CARDIOVERSION;  Surgeon: Pixie Casino, MD;  [facility] ENDOSCOPY;  Service: Cardiovascular;  Laterality: N/A;    There were no vitals filed for this visit.      Subjective Assessment - 04/11/16 1404    Subjective The patient reports that she gets queasy when she rolls onto the right side.  She stops and lets symptoms settle.  "When I sit up straight, everything goes like this (waves hands back and forth)".              [facility] PT Assessment - 04/11/16 1426    Ambulation/Gait   Ambulation/Gait Yes   Ambulation/Gait Assistance 6: Modified independent (Device/Increase time)  increased time   Ambulation Distance (Feet) 200 Feet   Assistive device None   Ambulation Surface Level   Gait velocity 2.20 ft/sec   Gait Comments Patient is using RW intermittently depending on balance.   Standardized Balance Assessment   Standardized Balance Assessment Berg Balance Test   Berg Balance Test   Sit to Stand Able to stand  independently using hands   Standing Unsupported Able to stand safely 2 minutes   Sitting with Back Unsupported but Feet Supported on Floor or Stool Able to sit safely and securely 2 minutes   Stand to Sit Sits safely with minimal use of hands   Transfers Able to transfer safely, definite need of hands   Standing Unsupported with Eyes Closed Able to stand 10 seconds with supervision   Standing Ubsupported with Feet Together Able to place feet together independently and stand for 1 minute with supervision   From Standing, Reach Forward with Outstretched Arm Can reach forward >12 cm safely (5")   From Standing Position, Pick up Object from Floor Unable to pick up and needs supervision   From Standing Position, Turn to Look Behind Over each Shoulder Turn sideways only  but maintains balance   Turn 360 Degrees Able to turn 360 degrees safely but slowly   Standing Unsupported, Alternately Place Feet on Step/Stool Able to complete 4 steps without aid or supervision   Standing Unsupported, One Foot in Front Able to take small step independently and hold 30 seconds   Standing on One Leg Tries to lift leg/unable to hold 3 seconds but remains standing independently   Total Score 37   Berg comment: 37/56           Vestibular Treatment/Exercise - 04/11/16 1416    Vestibular Treatment/Exercise   Vestibular Treatment Provided Habituation;Gaze   Habituation Exercises Horizontal Roll   Gaze Exercises X1 Viewing Horizontal   Horizontal Roll    Number of Reps  5   Symptom Description  with mild dizziness with right rolling   X1 Viewing Horizontal   Foot Position seated   Comments 30 seconds x 2 reps with cues on technique provided for HEP               PT Education - 04/11/16 1711    Education provided Yes   Education Details HEP: Gaze x 1, horizontal rolling, corner balance (partial heel/toe + EC, single limb stance)   Person(s) Educated Patient   Methods Explanation;Demonstration;Handout   Comprehension Verbalized understanding;Returned demonstration             PT Long Term Goals - 04/05/16 2213    PT LONG TERM GOAL #1   Title The patient will be able to perform HEP with assist from family for habituation, gaze adaptation, balance and mobility.   Baseline Target date 05/03/2016   Time 4   Period Weeks   PT LONG TERM GOAL #2   Title The patient will reduce DHI from 60% to < or equal to 45% to demo decreased self perception of vertigo.   Baseline Target date 05/03/2016   Time 4   Period Weeks   PT LONG TERM GOAL #3   Title The patient will tolerate supine<>bilateral sidelying without subjective c/o dizziness for improved bed mobility/functional tasks.   Baseline Target date 05/03/2016   Time 4   Period Weeks   PT LONG TERM GOAL #4   Title The patient will be further assessed on Berg and goal to follow.   Baseline Target date 05/03/2016   Time 4   Period Weeks   PT LONG TERM GOAL #5   Title The patient will be furthe assessed on gait speed and goal to follow, as indicated.   Baseline Target date 05/03/2016   Time 4   Period Weeks               Plan - 04/11/16 1712    Clinical Impression Statement The patient is at high risk for falls per Berg score of 37/56.  She has slowed gait speed.  Continue towards LTGs modifying based on clinical presentation.  Patient's dizziness improved today and not walking with RW.  She continues with muffled, diminished hearing in the right ear and plans to check on ENT  referral with primary care MD.   Rehab Potential Good   Clinical Impairments Affecting Rehab Potential Patient appears to have good family support.   PT Frequency 2x / week   PT Duration 4 weeks   PT Treatment/Interventions ADLs/Self Care Home Management;Balance training;Neuromuscular re-education;Gait training;Canalith Repostioning;Functional mobility training;Therapeutic activities;Therapeutic exercise;Vestibular;Patient/family education   PT Next Visit Plan Check HEP   Consulted and Agree with Plan of Care Patient;Family member/caregiver  Family Member Consulted daughter- [name]      Patient will benefit from skilled therapeutic intervention in order to improve the following deficits and impairments:  Abnormal gait, Dizziness, Difficulty walking, Decreased mobility, Decreased balance  Visit Diagnosis: Dizziness and giddiness  Other abnormalities of gait and mobility     Problem List Patient Active Problem List   Diagnosis Date Noted  . On amiodarone therapy 02/21/2016  . Acute on chronic diastolic heart failure ([place]) 10/29/2015  . Persistent atrial fibrillation ([place])   . Nausea vomiting and diarrhea 12/04/2014  . Hypoglycemia 12/03/2014  . Diabetes mellitus type 2, controlled ([place]) 12/03/2014  . CKD (chronic kidney disease) stage 3, GFR 30-59 ml/min 12/03/2014  . Hiatal hernia 12/03/2014  . GERD (gastroesophageal reflux disease) 12/03/2014  . Bronchitis, acute, with bronchospasm 06/23/2014  . Encounter for therapeutic drug monitoring 04/12/2014  . Atrial fibrillation ([place]) 11/18/2013  . Essential hypertension 11/18/2013  . Hyperlipidemia 11/18/2013  . Diabetes mellitus type 2, uncontrolled, with complications ([place]) [id number]  . Hyperaldosteronism ([place]) 11/18/2013  . Chronic diastolic heart failure ([place]) 11/18/2013    Class: Chronic    Liliane Mallis, PT 04/11/2016, 5:16 PM  [facility] [address] [place], [place], [address] Phone: [phone]   Fax:  [phone]  Name: JENIECE LONGCORE MRN: [id number] Date of Birth: 02-01-1930

## 2016-04-18 ENCOUNTER — Ambulatory Visit: Payer: Medicare Other | Admitting: Rehabilitative and Restorative Service Providers"

## 2016-04-19 ENCOUNTER — Ambulatory Visit (INDEPENDENT_AMBULATORY_CARE_PROVIDER_SITE_OTHER): Payer: Medicare Other | Admitting: Pharmacist

## 2016-04-19 DIAGNOSIS — I480 Paroxysmal atrial fibrillation: Secondary | ICD-10-CM | POA: Diagnosis not present

## 2016-04-19 DIAGNOSIS — Z5181 Encounter for therapeutic drug level monitoring: Secondary | ICD-10-CM | POA: Diagnosis not present

## 2016-04-19 DIAGNOSIS — H903 Sensorineural hearing loss, bilateral: Secondary | ICD-10-CM | POA: Diagnosis not present

## 2016-04-19 DIAGNOSIS — I4891 Unspecified atrial fibrillation: Secondary | ICD-10-CM

## 2016-04-19 DIAGNOSIS — H9120 Sudden idiopathic hearing loss, unspecified ear: Secondary | ICD-10-CM | POA: Diagnosis not present

## 2016-04-19 DIAGNOSIS — H8301 Labyrinthitis, right ear: Secondary | ICD-10-CM | POA: Diagnosis not present

## 2016-04-19 LAB — POCT INR: INR: 2.8

## 2016-04-20 ENCOUNTER — Ambulatory Visit: Payer: Medicare Other | Admitting: Rehabilitative and Restorative Service Providers"

## 2016-04-27 ENCOUNTER — Encounter: Payer: Medicare Other | Admitting: Rehabilitative and Restorative Service Providers"

## 2016-04-30 ENCOUNTER — Other Ambulatory Visit: Payer: Self-pay | Admitting: Interventional Cardiology

## 2016-05-01 ENCOUNTER — Encounter: Payer: Medicare Other | Admitting: Rehabilitative and Restorative Service Providers"

## 2016-05-01 ENCOUNTER — Other Ambulatory Visit: Payer: Self-pay

## 2016-05-01 MED ORDER — AMIODARONE HCL 200 MG PO TABS: 200.0000 mg | ORAL_TABLET | Freq: Every day | ORAL | Status: DC

## 2016-05-04 ENCOUNTER — Encounter: Payer: Medicare Other | Admitting: Rehabilitative and Restorative Service Providers"

## 2016-05-08 ENCOUNTER — Encounter: Payer: Medicare Other | Admitting: Rehabilitative and Restorative Service Providers"

## 2016-05-11 ENCOUNTER — Encounter: Payer: Medicare Other | Admitting: Rehabilitative and Restorative Service Providers"

## 2016-05-15 DIAGNOSIS — Z961 Presence of intraocular lens: Secondary | ICD-10-CM | POA: Diagnosis not present

## 2016-05-15 DIAGNOSIS — H35443 Age-related reticular degeneration of retina, bilateral: Secondary | ICD-10-CM | POA: Diagnosis not present

## 2016-05-15 DIAGNOSIS — H5202 Hypermetropia, left eye: Secondary | ICD-10-CM | POA: Diagnosis not present

## 2016-05-15 DIAGNOSIS — H26493 Other secondary cataract, bilateral: Secondary | ICD-10-CM | POA: Diagnosis not present

## 2016-05-15 DIAGNOSIS — H52222 Regular astigmatism, left eye: Secondary | ICD-10-CM | POA: Diagnosis not present

## 2016-05-15 DIAGNOSIS — H524 Presbyopia: Secondary | ICD-10-CM | POA: Diagnosis not present

## 2016-05-16 DIAGNOSIS — H903 Sensorineural hearing loss, bilateral: Secondary | ICD-10-CM | POA: Diagnosis not present

## 2016-05-16 DIAGNOSIS — H9311 Tinnitus, right ear: Secondary | ICD-10-CM | POA: Diagnosis not present

## 2016-05-20 ENCOUNTER — Other Ambulatory Visit: Payer: Self-pay | Admitting: Cardiovascular Disease

## 2016-05-21 DIAGNOSIS — Z7901 Long term (current) use of anticoagulants: Secondary | ICD-10-CM | POA: Diagnosis not present

## 2016-05-21 DIAGNOSIS — I10 Essential (primary) hypertension: Secondary | ICD-10-CM | POA: Diagnosis not present

## 2016-05-21 DIAGNOSIS — E785 Hyperlipidemia, unspecified: Secondary | ICD-10-CM | POA: Diagnosis not present

## 2016-05-21 DIAGNOSIS — I482 Chronic atrial fibrillation: Secondary | ICD-10-CM | POA: Diagnosis not present

## 2016-05-21 DIAGNOSIS — J301 Allergic rhinitis due to pollen: Secondary | ICD-10-CM | POA: Diagnosis not present

## 2016-05-21 DIAGNOSIS — N189 Chronic kidney disease, unspecified: Secondary | ICD-10-CM | POA: Diagnosis not present

## 2016-05-21 DIAGNOSIS — E119 Type 2 diabetes mellitus without complications: Secondary | ICD-10-CM | POA: Diagnosis not present

## 2016-05-21 DIAGNOSIS — K449 Diaphragmatic hernia without obstruction or gangrene: Secondary | ICD-10-CM | POA: Diagnosis not present

## 2016-05-21 NOTE — Telephone Encounter (Signed)
Rx(s) sent to pharmacy electronically.  

## 2016-05-23 ENCOUNTER — Encounter: Payer: Self-pay | Admitting: Rehabilitative and Restorative Service Providers"

## 2016-05-23 NOTE — Therapy (Signed)
[facility] [address] [place], [place], [address] Phone: [phone]   Fax:  [phone]  Patient Details  Name: Ariel Avila MRN: [id number] Date of Birth: [birth year] Referring Provider:  No ref. provider found  Encounter Date: last encounter 04/11/2016  PHYSICAL THERAPY DISCHARGE SUMMARY  Visits from Start of Care: 2  Current functional level related to goals / functional outcomes:       PT Long Term Goals - 04/05/16 2213    PT LONG TERM GOAL #1   Title The patient will be able to perform HEP with assist from family for habituation, gaze adaptation, balance and mobility.   Baseline Target date 05/03/2016   Time 4   Period Weeks   PT LONG TERM GOAL #2   Title The patient will reduce DHI from 60% to < or equal to 45% to demo decreased self perception of vertigo.   Baseline Target date 05/03/2016   Time 4   Period Weeks   PT LONG TERM GOAL #3   Title The patient will tolerate supine<>bilateral sidelying without subjective c/o dizziness for improved bed mobility/functional tasks.   Baseline Target date 05/03/2016   Time 4   Period Weeks   PT LONG TERM GOAL #4   Title The patient will be further assessed on Berg and goal to follow.   Baseline Target date 05/03/2016   Time 4   Period Weeks   PT LONG TERM GOAL #5   Title The patient will be furthe assessed on gait speed and goal to follow, as indicated.   Baseline Target date 05/03/2016   Time 4   Period Weeks     *Goals not reassessed as patient cancelled remaining visits due to report that her doctor did not think therapy could help her acute inner ear issues.   Remaining deficits: See eval   Education / Equipment: Patient was given HEP for balance, habituation, gaze adaptation and vestibular rehab activities at first tx session to address vestibular deficits.  Plan: Patient agrees to discharge.  Patient goals were not met. Patient is being discharged due to  not returning since the last visit.  ?????        Thank you for the referral of this patient. Rudell Cobb, MPT   Ariel Avila 05/23/2016, 8:28 AM  [facility] [address] [place], [place], [address] Phone: [phone]   Fax:  [phone]

## 2016-05-24 DIAGNOSIS — H26492 Other secondary cataract, left eye: Secondary | ICD-10-CM | POA: Diagnosis not present

## 2016-05-24 DIAGNOSIS — H52222 Regular astigmatism, left eye: Secondary | ICD-10-CM | POA: Diagnosis not present

## 2016-05-24 DIAGNOSIS — H524 Presbyopia: Secondary | ICD-10-CM | POA: Diagnosis not present

## 2016-05-24 DIAGNOSIS — Z961 Presence of intraocular lens: Secondary | ICD-10-CM | POA: Diagnosis not present

## 2016-05-24 DIAGNOSIS — H9311 Tinnitus, right ear: Secondary | ICD-10-CM | POA: Diagnosis not present

## 2016-05-24 DIAGNOSIS — H903 Sensorineural hearing loss, bilateral: Secondary | ICD-10-CM | POA: Diagnosis not present

## 2016-05-24 DIAGNOSIS — H5202 Hypermetropia, left eye: Secondary | ICD-10-CM | POA: Diagnosis not present

## 2016-05-30 ENCOUNTER — Ambulatory Visit (INDEPENDENT_AMBULATORY_CARE_PROVIDER_SITE_OTHER): Payer: Medicare Other | Admitting: *Deleted

## 2016-05-30 DIAGNOSIS — I480 Paroxysmal atrial fibrillation: Secondary | ICD-10-CM | POA: Diagnosis not present

## 2016-05-30 DIAGNOSIS — Z5181 Encounter for therapeutic drug level monitoring: Secondary | ICD-10-CM | POA: Diagnosis not present

## 2016-05-30 DIAGNOSIS — I4891 Unspecified atrial fibrillation: Secondary | ICD-10-CM | POA: Diagnosis not present

## 2016-05-30 LAB — POCT INR: INR: 3.1

## 2016-06-07 DIAGNOSIS — H26491 Other secondary cataract, right eye: Secondary | ICD-10-CM | POA: Diagnosis not present

## 2016-06-11 DIAGNOSIS — H66002 Acute suppurative otitis media without spontaneous rupture of ear drum, left ear: Secondary | ICD-10-CM | POA: Diagnosis not present

## 2016-06-11 DIAGNOSIS — H9311 Tinnitus, right ear: Secondary | ICD-10-CM | POA: Diagnosis not present

## 2016-06-11 DIAGNOSIS — H73002 Acute myringitis, left ear: Secondary | ICD-10-CM | POA: Diagnosis not present

## 2016-06-11 DIAGNOSIS — H903 Sensorineural hearing loss, bilateral: Secondary | ICD-10-CM | POA: Diagnosis not present

## 2016-06-12 ENCOUNTER — Other Ambulatory Visit: Payer: Self-pay | Admitting: Otolaryngology

## 2016-06-12 DIAGNOSIS — H9311 Tinnitus, right ear: Secondary | ICD-10-CM

## 2016-06-12 DIAGNOSIS — H903 Sensorineural hearing loss, bilateral: Secondary | ICD-10-CM

## 2016-06-13 ENCOUNTER — Telehealth: Payer: Self-pay | Admitting: *Deleted

## 2016-06-13 NOTE — Telephone Encounter (Signed)
Pt was in the [facility] office today with husband and stated that she started Augmentin 875-125mg , she is taking 1 tablet daily twice a day for 10 days.  She started the medication 06/11/16 & will complete 06/21/16, advised that the medication does not interfere with Coumadin and she verbalized understanding.  Instructed to call & notify us of any other new medication changes & she verbalized understanding.

## 2016-06-15 ENCOUNTER — Ambulatory Visit
Admission: RE | Admit: 2016-06-15 | Discharge: 2016-06-15 | Disposition: A | Discharge status: Home / Self Care | Payer: Medicare Other | Source: Ambulatory Visit | Attending: Otolaryngology | Admitting: Otolaryngology

## 2016-06-15 ENCOUNTER — Other Ambulatory Visit: Payer: Self-pay | Admitting: Otolaryngology

## 2016-06-15 DIAGNOSIS — H903 Sensorineural hearing loss, bilateral: Secondary | ICD-10-CM

## 2016-06-15 DIAGNOSIS — H9311 Tinnitus, right ear: Secondary | ICD-10-CM

## 2016-06-15 DIAGNOSIS — H748X1 Other specified disorders of right middle ear and mastoid: Secondary | ICD-10-CM | POA: Diagnosis not present

## 2016-06-20 DIAGNOSIS — H10413 Chronic giant papillary conjunctivitis, bilateral: Secondary | ICD-10-CM | POA: Diagnosis not present

## 2016-06-21 ENCOUNTER — Encounter: Payer: Self-pay | Admitting: Pulmonary Disease

## 2016-06-28 DIAGNOSIS — H903 Sensorineural hearing loss, bilateral: Secondary | ICD-10-CM | POA: Diagnosis not present

## 2016-06-28 DIAGNOSIS — H9311 Tinnitus, right ear: Secondary | ICD-10-CM | POA: Diagnosis not present

## 2016-07-04 ENCOUNTER — Ambulatory Visit (INDEPENDENT_AMBULATORY_CARE_PROVIDER_SITE_OTHER): Payer: Medicare Other | Admitting: Pharmacist

## 2016-07-04 DIAGNOSIS — I480 Paroxysmal atrial fibrillation: Secondary | ICD-10-CM

## 2016-07-04 DIAGNOSIS — I4891 Unspecified atrial fibrillation: Secondary | ICD-10-CM | POA: Diagnosis not present

## 2016-07-04 DIAGNOSIS — Z5181 Encounter for therapeutic drug level monitoring: Secondary | ICD-10-CM | POA: Diagnosis not present

## 2016-07-04 LAB — POCT INR: INR: 3

## 2016-07-16 DIAGNOSIS — H903 Sensorineural hearing loss, bilateral: Secondary | ICD-10-CM | POA: Diagnosis not present

## 2016-07-16 DIAGNOSIS — H9311 Tinnitus, right ear: Secondary | ICD-10-CM | POA: Diagnosis not present

## 2016-07-24 ENCOUNTER — Other Ambulatory Visit: Payer: Self-pay | Admitting: Interventional Cardiology

## 2016-07-25 ENCOUNTER — Ambulatory Visit (INDEPENDENT_AMBULATORY_CARE_PROVIDER_SITE_OTHER): Payer: Medicare Other | Admitting: *Deleted

## 2016-07-25 DIAGNOSIS — Z5181 Encounter for therapeutic drug level monitoring: Secondary | ICD-10-CM

## 2016-07-25 DIAGNOSIS — I4891 Unspecified atrial fibrillation: Secondary | ICD-10-CM | POA: Diagnosis not present

## 2016-07-25 LAB — POCT INR: INR: 3.2

## 2016-07-30 DIAGNOSIS — K449 Diaphragmatic hernia without obstruction or gangrene: Secondary | ICD-10-CM | POA: Diagnosis not present

## 2016-07-30 DIAGNOSIS — E785 Hyperlipidemia, unspecified: Secondary | ICD-10-CM | POA: Diagnosis not present

## 2016-07-30 DIAGNOSIS — Z7901 Long term (current) use of anticoagulants: Secondary | ICD-10-CM | POA: Diagnosis not present

## 2016-07-30 DIAGNOSIS — I10 Essential (primary) hypertension: Secondary | ICD-10-CM | POA: Diagnosis not present

## 2016-07-30 DIAGNOSIS — E119 Type 2 diabetes mellitus without complications: Secondary | ICD-10-CM | POA: Diagnosis not present

## 2016-07-30 DIAGNOSIS — N189 Chronic kidney disease, unspecified: Secondary | ICD-10-CM | POA: Diagnosis not present

## 2016-07-30 DIAGNOSIS — J301 Allergic rhinitis due to pollen: Secondary | ICD-10-CM | POA: Diagnosis not present

## 2016-07-30 DIAGNOSIS — I482 Chronic atrial fibrillation: Secondary | ICD-10-CM | POA: Diagnosis not present

## 2016-07-31 DIAGNOSIS — H9311 Tinnitus, right ear: Secondary | ICD-10-CM | POA: Diagnosis not present

## 2016-07-31 DIAGNOSIS — H903 Sensorineural hearing loss, bilateral: Secondary | ICD-10-CM | POA: Diagnosis not present

## 2016-08-01 ENCOUNTER — Telehealth: Payer: Self-pay | Admitting: *Deleted

## 2016-08-01 NOTE — Telephone Encounter (Signed)
Called and spoke with patient, she is refusing an appointment.  She said that she cancelled her last appointment because she does not want to have a biopsy done.  She said that she is afraid if she has a biopsy done that if it is cancer it will spread because of the biopsy.  She said it has happened to a couple of her friends and she is not going to have that done.   Dr. Elsworth Soho, please advise.

## 2016-08-01 NOTE — Telephone Encounter (Signed)
Can discuss options and review with her-on office visit

## 2016-08-01 NOTE — Telephone Encounter (Signed)
-----   Message from Rigoberto Noel, MD sent at 08/01/2016  9:50 AM EDT ----- Regarding: RE: pet scan  She needs follow-up appointment with me Was never seen- for nodule noted on CT scan 02/2016 ----- Message ----- From: Glean Hess, CMA Sent: 02/14/2016   5:29 PM To: Rigoberto Noel, MD Subject: RE: pet scan                                   Did you mean 1 week instead of 1 month?  Pt is scheduled to see Dr. Ashok Cordia the week of the PET?  ----- Message -----    From: Rigoberto Noel, MD    Sent: 02/06/2016  11:41 AM      To: Erick Colace, NP, Glean Hess, CMA, # Subject: RE: pet scan                                   I can get her in [place], can you pl change her appt  To me in 5month after PET scan please? 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 Then incidentally found LLL nodule, LUL and RUL nodule. The LLL looked concerning so Ruby Cola Had me order PET scan for a month out. I think the scan results may be called to [facility], but the appointment for f/u is w/ Creig Hines a few days after the PET.  Just wanted to give you a heads up  -Daniel

## 2016-08-06 NOTE — Telephone Encounter (Signed)
ok 

## 2016-08-06 NOTE — Telephone Encounter (Signed)
Patient states that her daughter is a Marine scientist at [employer] and will discuss this with Dr. Elsworth Soho at the hospital.  She said that she does not want to schedule an appointment until her daughter has spoken with Dr. Elsworth Soho because she is not having a Biopsy done and will not come in to "discuss options" if a biopsy is one of the options.  Will forward this message to Dr. Elsworth Soho as Juluis Rainier.

## 2016-08-16 ENCOUNTER — Ambulatory Visit (INDEPENDENT_AMBULATORY_CARE_PROVIDER_SITE_OTHER): Payer: Medicare Other

## 2016-08-16 DIAGNOSIS — I4891 Unspecified atrial fibrillation: Secondary | ICD-10-CM

## 2016-08-16 DIAGNOSIS — Z5181 Encounter for therapeutic drug level monitoring: Secondary | ICD-10-CM | POA: Diagnosis not present

## 2016-08-16 LAB — POCT INR: INR: 2.4

## 2016-08-30 DIAGNOSIS — Z6827 Body mass index (BMI) 27.0-27.9, adult: Secondary | ICD-10-CM | POA: Diagnosis not present

## 2016-08-30 DIAGNOSIS — N183 Chronic kidney disease, stage 3 (moderate): Secondary | ICD-10-CM | POA: Diagnosis not present

## 2016-08-30 DIAGNOSIS — N189 Chronic kidney disease, unspecified: Secondary | ICD-10-CM | POA: Diagnosis not present

## 2016-08-30 DIAGNOSIS — N2581 Secondary hyperparathyroidism of renal origin: Secondary | ICD-10-CM | POA: Diagnosis not present

## 2016-08-30 DIAGNOSIS — D631 Anemia in chronic kidney disease: Secondary | ICD-10-CM | POA: Diagnosis not present

## 2016-10-02 DIAGNOSIS — Z961 Presence of intraocular lens: Secondary | ICD-10-CM | POA: Diagnosis not present

## 2016-10-02 DIAGNOSIS — H1132 Conjunctival hemorrhage, left eye: Secondary | ICD-10-CM | POA: Diagnosis not present

## 2016-10-02 DIAGNOSIS — H10413 Chronic giant papillary conjunctivitis, bilateral: Secondary | ICD-10-CM | POA: Diagnosis not present

## 2016-10-02 DIAGNOSIS — H524 Presbyopia: Secondary | ICD-10-CM | POA: Diagnosis not present

## 2016-10-02 DIAGNOSIS — H16122 Filamentary keratitis, left eye: Secondary | ICD-10-CM | POA: Diagnosis not present

## 2016-10-05 DIAGNOSIS — M1712 Unilateral primary osteoarthritis, left knee: Secondary | ICD-10-CM | POA: Diagnosis not present

## 2016-10-05 DIAGNOSIS — I482 Chronic atrial fibrillation: Secondary | ICD-10-CM | POA: Diagnosis not present

## 2016-10-05 DIAGNOSIS — I10 Essential (primary) hypertension: Secondary | ICD-10-CM | POA: Diagnosis not present

## 2016-10-05 DIAGNOSIS — N189 Chronic kidney disease, unspecified: Secondary | ICD-10-CM | POA: Diagnosis not present

## 2016-10-05 DIAGNOSIS — Z23 Encounter for immunization: Secondary | ICD-10-CM | POA: Diagnosis not present

## 2016-10-05 DIAGNOSIS — I5032 Chronic diastolic (congestive) heart failure: Secondary | ICD-10-CM | POA: Diagnosis not present

## 2016-10-05 DIAGNOSIS — E785 Hyperlipidemia, unspecified: Secondary | ICD-10-CM | POA: Diagnosis not present

## 2016-10-05 DIAGNOSIS — E119 Type 2 diabetes mellitus without complications: Secondary | ICD-10-CM | POA: Diagnosis not present

## 2016-10-08 DIAGNOSIS — Z7901 Long term (current) use of anticoagulants: Secondary | ICD-10-CM | POA: Diagnosis not present

## 2016-10-09 DIAGNOSIS — H16122 Filamentary keratitis, left eye: Secondary | ICD-10-CM | POA: Diagnosis not present

## 2016-10-09 DIAGNOSIS — Z961 Presence of intraocular lens: Secondary | ICD-10-CM | POA: Diagnosis not present

## 2016-10-09 DIAGNOSIS — H04123 Dry eye syndrome of bilateral lacrimal glands: Secondary | ICD-10-CM | POA: Diagnosis not present

## 2016-10-09 DIAGNOSIS — H01005 Unspecified blepharitis left lower eyelid: Secondary | ICD-10-CM | POA: Diagnosis not present

## 2016-10-09 DIAGNOSIS — H01001 Unspecified blepharitis right upper eyelid: Secondary | ICD-10-CM | POA: Diagnosis not present

## 2016-10-09 DIAGNOSIS — H01002 Unspecified blepharitis right lower eyelid: Secondary | ICD-10-CM | POA: Diagnosis not present

## 2016-10-09 DIAGNOSIS — H01004 Unspecified blepharitis left upper eyelid: Secondary | ICD-10-CM | POA: Diagnosis not present

## 2016-10-16 DIAGNOSIS — H1132 Conjunctival hemorrhage, left eye: Secondary | ICD-10-CM | POA: Diagnosis not present

## 2016-10-16 DIAGNOSIS — H10413 Chronic giant papillary conjunctivitis, bilateral: Secondary | ICD-10-CM | POA: Diagnosis not present

## 2016-10-16 DIAGNOSIS — H16122 Filamentary keratitis, left eye: Secondary | ICD-10-CM | POA: Diagnosis not present

## 2016-10-16 DIAGNOSIS — H01001 Unspecified blepharitis right upper eyelid: Secondary | ICD-10-CM | POA: Diagnosis not present

## 2016-10-25 ENCOUNTER — Ambulatory Visit (INDEPENDENT_AMBULATORY_CARE_PROVIDER_SITE_OTHER): Payer: Medicare Other | Admitting: Pharmacist

## 2016-10-25 DIAGNOSIS — I4891 Unspecified atrial fibrillation: Secondary | ICD-10-CM

## 2016-10-25 DIAGNOSIS — Z5181 Encounter for therapeutic drug level monitoring: Secondary | ICD-10-CM

## 2016-10-25 LAB — POCT INR: INR: 4.1

## 2016-11-07 ENCOUNTER — Ambulatory Visit (INDEPENDENT_AMBULATORY_CARE_PROVIDER_SITE_OTHER): Payer: Medicare Other | Admitting: *Deleted

## 2016-11-07 DIAGNOSIS — Z5181 Encounter for therapeutic drug level monitoring: Secondary | ICD-10-CM

## 2016-11-07 DIAGNOSIS — I4891 Unspecified atrial fibrillation: Secondary | ICD-10-CM | POA: Diagnosis not present

## 2016-11-07 LAB — POCT INR: INR: 3.1

## 2016-11-09 DIAGNOSIS — M21062 Valgus deformity, not elsewhere classified, left knee: Secondary | ICD-10-CM | POA: Diagnosis not present

## 2016-11-09 DIAGNOSIS — R42 Dizziness and giddiness: Secondary | ICD-10-CM | POA: Diagnosis not present

## 2016-11-09 DIAGNOSIS — I872 Venous insufficiency (chronic) (peripheral): Secondary | ICD-10-CM | POA: Diagnosis not present

## 2016-11-09 DIAGNOSIS — R600 Localized edema: Secondary | ICD-10-CM | POA: Diagnosis not present

## 2016-11-09 DIAGNOSIS — M1712 Unilateral primary osteoarthritis, left knee: Secondary | ICD-10-CM | POA: Diagnosis not present

## 2016-11-20 ENCOUNTER — Ambulatory Visit (INDEPENDENT_AMBULATORY_CARE_PROVIDER_SITE_OTHER): Payer: Medicare Other | Admitting: *Deleted

## 2016-11-20 DIAGNOSIS — I4891 Unspecified atrial fibrillation: Secondary | ICD-10-CM | POA: Diagnosis not present

## 2016-11-20 DIAGNOSIS — Z5181 Encounter for therapeutic drug level monitoring: Secondary | ICD-10-CM | POA: Diagnosis not present

## 2016-11-20 LAB — POCT INR: INR: 3.5

## 2016-11-21 ENCOUNTER — Other Ambulatory Visit: Payer: Self-pay | Admitting: Interventional Cardiology

## 2016-11-28 DIAGNOSIS — Z7901 Long term (current) use of anticoagulants: Secondary | ICD-10-CM | POA: Diagnosis not present

## 2016-11-28 DIAGNOSIS — J988 Other specified respiratory disorders: Secondary | ICD-10-CM | POA: Diagnosis not present

## 2016-12-04 ENCOUNTER — Ambulatory Visit (INDEPENDENT_AMBULATORY_CARE_PROVIDER_SITE_OTHER): Payer: Medicare Other | Admitting: *Deleted

## 2016-12-04 DIAGNOSIS — Z5181 Encounter for therapeutic drug level monitoring: Secondary | ICD-10-CM

## 2016-12-04 DIAGNOSIS — I4891 Unspecified atrial fibrillation: Secondary | ICD-10-CM | POA: Diagnosis not present

## 2016-12-04 DIAGNOSIS — R0781 Pleurodynia: Secondary | ICD-10-CM | POA: Diagnosis not present

## 2016-12-04 LAB — POCT INR: INR: 3.2

## 2016-12-06 DIAGNOSIS — M1712 Unilateral primary osteoarthritis, left knee: Secondary | ICD-10-CM | POA: Diagnosis not present

## 2016-12-10 DIAGNOSIS — R05 Cough: Secondary | ICD-10-CM | POA: Diagnosis not present

## 2016-12-18 ENCOUNTER — Ambulatory Visit (INDEPENDENT_AMBULATORY_CARE_PROVIDER_SITE_OTHER): Payer: Medicare Other

## 2016-12-18 DIAGNOSIS — N189 Chronic kidney disease, unspecified: Secondary | ICD-10-CM | POA: Diagnosis not present

## 2016-12-18 DIAGNOSIS — I4891 Unspecified atrial fibrillation: Secondary | ICD-10-CM | POA: Diagnosis not present

## 2016-12-18 DIAGNOSIS — Z5181 Encounter for therapeutic drug level monitoring: Secondary | ICD-10-CM | POA: Diagnosis not present

## 2016-12-18 DIAGNOSIS — Z7984 Long term (current) use of oral hypoglycemic drugs: Secondary | ICD-10-CM | POA: Diagnosis not present

## 2016-12-18 DIAGNOSIS — I5032 Chronic diastolic (congestive) heart failure: Secondary | ICD-10-CM | POA: Diagnosis not present

## 2016-12-18 DIAGNOSIS — E119 Type 2 diabetes mellitus without complications: Secondary | ICD-10-CM | POA: Diagnosis not present

## 2016-12-18 DIAGNOSIS — R600 Localized edema: Secondary | ICD-10-CM | POA: Diagnosis not present

## 2016-12-18 DIAGNOSIS — R05 Cough: Secondary | ICD-10-CM | POA: Diagnosis not present

## 2016-12-18 DIAGNOSIS — I482 Chronic atrial fibrillation: Secondary | ICD-10-CM | POA: Diagnosis not present

## 2016-12-18 LAB — POCT INR: INR: 4.9

## 2016-12-21 DIAGNOSIS — Z7984 Long term (current) use of oral hypoglycemic drugs: Secondary | ICD-10-CM | POA: Diagnosis not present

## 2016-12-21 DIAGNOSIS — E119 Type 2 diabetes mellitus without complications: Secondary | ICD-10-CM | POA: Diagnosis not present

## 2016-12-21 DIAGNOSIS — I5032 Chronic diastolic (congestive) heart failure: Secondary | ICD-10-CM | POA: Diagnosis not present

## 2016-12-21 DIAGNOSIS — N189 Chronic kidney disease, unspecified: Secondary | ICD-10-CM | POA: Diagnosis not present

## 2016-12-21 DIAGNOSIS — R05 Cough: Secondary | ICD-10-CM | POA: Diagnosis not present

## 2016-12-21 DIAGNOSIS — I482 Chronic atrial fibrillation: Secondary | ICD-10-CM | POA: Diagnosis not present

## 2016-12-27 DIAGNOSIS — I5032 Chronic diastolic (congestive) heart failure: Secondary | ICD-10-CM | POA: Diagnosis not present

## 2016-12-27 DIAGNOSIS — Z20828 Contact with and (suspected) exposure to other viral communicable diseases: Secondary | ICD-10-CM | POA: Diagnosis not present

## 2016-12-27 DIAGNOSIS — Z7901 Long term (current) use of anticoagulants: Secondary | ICD-10-CM | POA: Diagnosis not present

## 2016-12-27 DIAGNOSIS — R05 Cough: Secondary | ICD-10-CM | POA: Diagnosis not present

## 2017-01-10 DIAGNOSIS — I10 Essential (primary) hypertension: Secondary | ICD-10-CM | POA: Diagnosis not present

## 2017-01-10 DIAGNOSIS — E119 Type 2 diabetes mellitus without complications: Secondary | ICD-10-CM | POA: Diagnosis not present

## 2017-01-10 DIAGNOSIS — E785 Hyperlipidemia, unspecified: Secondary | ICD-10-CM | POA: Diagnosis not present

## 2017-01-10 DIAGNOSIS — R0981 Nasal congestion: Secondary | ICD-10-CM | POA: Diagnosis not present

## 2017-01-10 DIAGNOSIS — J101 Influenza due to other identified influenza virus with other respiratory manifestations: Secondary | ICD-10-CM | POA: Diagnosis not present

## 2017-01-10 DIAGNOSIS — N189 Chronic kidney disease, unspecified: Secondary | ICD-10-CM | POA: Diagnosis not present

## 2017-01-10 DIAGNOSIS — Z7984 Long term (current) use of oral hypoglycemic drugs: Secondary | ICD-10-CM | POA: Diagnosis not present

## 2017-01-10 DIAGNOSIS — Z7901 Long term (current) use of anticoagulants: Secondary | ICD-10-CM | POA: Diagnosis not present

## 2017-01-24 DIAGNOSIS — H8110 Benign paroxysmal vertigo, unspecified ear: Secondary | ICD-10-CM | POA: Diagnosis not present

## 2017-01-24 DIAGNOSIS — H903 Sensorineural hearing loss, bilateral: Secondary | ICD-10-CM | POA: Diagnosis not present

## 2017-01-24 DIAGNOSIS — H9311 Tinnitus, right ear: Secondary | ICD-10-CM | POA: Diagnosis not present

## 2017-01-25 ENCOUNTER — Ambulatory Visit (INDEPENDENT_AMBULATORY_CARE_PROVIDER_SITE_OTHER): Payer: Medicare Other | Admitting: *Deleted

## 2017-01-25 DIAGNOSIS — Z5181 Encounter for therapeutic drug level monitoring: Secondary | ICD-10-CM | POA: Diagnosis not present

## 2017-01-25 DIAGNOSIS — I4891 Unspecified atrial fibrillation: Secondary | ICD-10-CM | POA: Diagnosis not present

## 2017-01-25 LAB — POCT INR: INR: 3.8

## 2017-01-30 DIAGNOSIS — H01002 Unspecified blepharitis right lower eyelid: Secondary | ICD-10-CM | POA: Diagnosis not present

## 2017-01-30 DIAGNOSIS — E113293 Type 2 diabetes mellitus with mild nonproliferative diabetic retinopathy without macular edema, bilateral: Secondary | ICD-10-CM | POA: Diagnosis not present

## 2017-01-30 DIAGNOSIS — H01001 Unspecified blepharitis right upper eyelid: Secondary | ICD-10-CM | POA: Diagnosis not present

## 2017-01-30 DIAGNOSIS — H04123 Dry eye syndrome of bilateral lacrimal glands: Secondary | ICD-10-CM | POA: Diagnosis not present

## 2017-01-30 DIAGNOSIS — H01005 Unspecified blepharitis left lower eyelid: Secondary | ICD-10-CM | POA: Diagnosis not present

## 2017-01-30 DIAGNOSIS — H01004 Unspecified blepharitis left upper eyelid: Secondary | ICD-10-CM | POA: Diagnosis not present

## 2017-01-30 DIAGNOSIS — H16122 Filamentary keratitis, left eye: Secondary | ICD-10-CM | POA: Diagnosis not present

## 2017-02-02 ENCOUNTER — Encounter (INDEPENDENT_AMBULATORY_CARE_PROVIDER_SITE_OTHER): Payer: Medicare Other | Admitting: Ophthalmology

## 2017-02-02 DIAGNOSIS — H1032 Unspecified acute conjunctivitis, left eye: Secondary | ICD-10-CM | POA: Diagnosis not present

## 2017-02-05 DIAGNOSIS — H1820 Unspecified corneal edema: Secondary | ICD-10-CM | POA: Diagnosis not present

## 2017-02-05 DIAGNOSIS — H16122 Filamentary keratitis, left eye: Secondary | ICD-10-CM | POA: Diagnosis not present

## 2017-02-07 ENCOUNTER — Other Ambulatory Visit: Payer: Self-pay | Admitting: Interventional Cardiology

## 2017-02-08 ENCOUNTER — Ambulatory Visit: Payer: Medicare Other | Attending: Otolaryngology | Admitting: Rehabilitative and Restorative Service Providers"

## 2017-02-08 ENCOUNTER — Ambulatory Visit (INDEPENDENT_AMBULATORY_CARE_PROVIDER_SITE_OTHER): Payer: Medicare Other | Admitting: Pharmacist

## 2017-02-08 DIAGNOSIS — Z5181 Encounter for therapeutic drug level monitoring: Secondary | ICD-10-CM | POA: Diagnosis not present

## 2017-02-08 DIAGNOSIS — H8111 Benign paroxysmal vertigo, right ear: Secondary | ICD-10-CM | POA: Insufficient documentation

## 2017-02-08 DIAGNOSIS — H1820 Unspecified corneal edema: Secondary | ICD-10-CM | POA: Diagnosis not present

## 2017-02-08 DIAGNOSIS — R2689 Other abnormalities of gait and mobility: Secondary | ICD-10-CM

## 2017-02-08 DIAGNOSIS — R42 Dizziness and giddiness: Secondary | ICD-10-CM | POA: Diagnosis not present

## 2017-02-08 DIAGNOSIS — H16223 Keratoconjunctivitis sicca, not specified as Sjogren's, bilateral: Secondary | ICD-10-CM | POA: Diagnosis not present

## 2017-02-08 DIAGNOSIS — I4891 Unspecified atrial fibrillation: Secondary | ICD-10-CM

## 2017-02-08 DIAGNOSIS — H16122 Filamentary keratitis, left eye: Secondary | ICD-10-CM | POA: Diagnosis not present

## 2017-02-08 DIAGNOSIS — H04123 Dry eye syndrome of bilateral lacrimal glands: Secondary | ICD-10-CM | POA: Diagnosis not present

## 2017-02-08 LAB — POCT INR: INR: 2.3

## 2017-02-08 NOTE — Therapy (Signed)
[facility] [address] [place], [place], [address] Phone: [phone]   Fax:  [phone]  Physical Therapy Evaluation  Patient Details  Name: Ariel Avila MRN: [id number] Date of Birth: 1930/09/11 Referring Provider: Vicie Mutters, MD  Encounter Date: 02/08/2017      PT End of Session - 02/08/17 1553    Visit Number 1   Number of Visits 12   Date for PT Re-Evaluation 04/09/17   Authorization Type G code every 10th visit   PT Start Time 1448   PT Stop Time 1550   PT Time Calculation (min) 62 min   Equipment Utilized During Treatment Gait belt   Activity Tolerance Other (comment)  patient with nausea associated with dizziness and imbalance after testing   Behavior During Therapy [facility] for tasks assessed/performed      Past Medical History:  Diagnosis Date  . Allergy   . Arthritis    "fingers and back" (06/23/2014)  . Basal cell carcinoma   . CHF (congestive heart failure) ([place])   . Chronic bronchitis ([place])    "lately" (06/23/2014)  . Chronic kidney disease (CKD), stage III (moderate)   . Dysrhythmia   . GERD (gastroesophageal reflux disease)   . H/O hiatal hernia   . Hyperlipidemia   . Hypertension   . Osteoporosis   . Paroxysmal a-fib ([place])   . Type II diabetes mellitus ([place])     Past Surgical History:  Procedure Laterality Date  . ABDOMINAL HYSTERECTOMY    . APPENDECTOMY    . BASAL CELL CARCINOMA EXCISION     "face and nose"  . CARDIOVERSION N/A 10/24/2015   Procedure: CARDIOVERSION;  Surgeon: Pixie Casino, MD;  Location: [facility] ENDOSCOPY;  Service: Cardiovascular;  Laterality: N/A;    There were no vitals filed for this visit.       Subjective Assessment - 02/08/17 1448    Subjective The patient reports that she is doing worse than she was the last time she was seen by PT in 03/2016 (saw for 2 visits and visits were cancelled with reason of MD recommended they cancel because of nature of  pathology).  Her daughter is with her today and reports that in addition to vertigo last year, she also had R hearing loss.  She is still describing dizziness and notes imbalance with falls.   She reports that she has intermittent echoing in her ear and occasional "stopping up" sensation in her ears.   She also feels recent onset of L knee pain is contributing to imbalance.   Patient is accompained by: Family member  daughter   Patient Stated Goals Get rid of dizziness so I can walk straight and turn my head.   Currently in Pain? Yes   Pain Score --  patient walks with antalgic pattern due to L knee pain- this is chronic and followed by orthopedic MD.  She also has bilateral swelling in legs with h/o CHF noting up 4 lbs in weight.   Pain Location Knee  wearing brace- seen by orthopedic MD.     Pain Orientation Left   Effect of Pain on Daily Activities PT to monitor, but no goals to follow due to nature of referral.             [facility] PT Assessment - 02/08/17 1458      Assessment   Medical Diagnosis BPPV, unspecified laterality, vertigo, unsteady gait   Referring Provider Vicie Mutters, MD   Onset Date/Surgical Date --  03/2016   Prior Therapy known to PT from 03/2016 evaluation- cancelled remaining visit with message "doctor doesn't think PT can help my vertigo"     Precautions   Precautions Fall     Restrictions   Weight Bearing Restrictions No     Balance Screen   Has the patient fallen in the past 6 months Yes   How many times? 4- walking in yard fell during a turn, in house fell during a head turn noting "it throws me off balance" and another time fell indoors when left knee gave way and a fourth time due to dizziness.   Has the patient had a decrease in activity level because of a fear of falling?  Yes   Is the patient reluctant to leave their home because of a fear of falling?  Yes     [place] Private residence   Living Arrangements  Spouse/significant other   Type of [place] Access Level entry   Home Layout One level   [place] - 2 wheels   Additional Comments Not using device; has 3 steps in back yard - able to hold rails.       Prior Function   Level of Independence Independent     Observation/Other Assessments   Focus on Therapeutic Outcomes (FOTO)  40%   Other Surveys  Other Surveys   Dizziness Handicap Inventory [facility])  68%            Vestibular Assessment - 02/08/17 1501      Vestibular Assessment   General Observation Can't turn to the right or look down due to dizziness; avoiding having hair washed at hair dresser due to vertigo.      Symptom Behavior   Type of Dizziness Spinning   Frequency of Dizziness daily   Duration of Dizziness initially constant when it began in 03/2016.  Now, the patient notes dizziness with horizontal head turns to the right and when looking down.   Aggravating Factors Activity in general  Turning head right and looking down.   Relieving Factors Head stationary     Occulomotor Exam   Occulomotor Alignment Abnormal  L eye mildly adducted as compared to right   Spontaneous Absent   Gaze-induced Absent   Smooth Pursuits Saccades  worse in vertical plane   Saccades Dysmetria  noting overshooting worse in vertical plane   Comment *ABNORMAL OCULOMOTOR EXAM indicating possible central contributing factors? Imaging has been performed per medical chart review--no acute abnormalities seen on impression statements.     Vestibulo-Occular Reflex   VOR 1 Head Only (x 1 viewing) Slow gaze x 1 provokes mild sensation of dizziness and nausea.    Comment Head impulse test= patient doesn't tolerate today and closes her eyes.  She guards with her neck against passive movement noting movement to right aggravates symptoms.      Positional Testing   Dix-Hallpike Dix-Hallpike Right;Dix-Hallpike Left   Sidelying Test Sidelying Right;Sidelying Left   Horizontal  Canal Testing Horizontal Canal Right;Horizontal Canal Left     Dix-Hallpike Right   Dix-Hallpike Right Duration noted upbeating, rotary nystagmus lasting 45+ seconds   Dix-Hallpike Right Symptoms Upbeat, right rotatory nystagmus     Dix-Hallpike Left   Dix-Hallpike Left Duration general sensation of dizziness   Dix-Hallpike Left Symptoms No nystagmus     Sidelying Right   Sidelying Right Duration seconds to minute with patient requesting to come out of this position due to  nausea   Sidelying Right Symptoms Upbeat, right rotatory nystagmus  nausea noted; has to let symptoms settle x minutes     Sidelying Left   Sidelying Left Duration dizziness reported x seconds   Sidelying Left Symptoms No nystagmus     Horizontal Canal Right   Horizontal Canal Right Duration patient c/o nausea and general dizziness, but denies room spinning   Horizontal Canal Right Symptoms Normal     Horizontal Canal Left   Horizontal Canal Left Duration patient c/o nausea and general dizziness, but no spinning   Horizontal Canal Left Symptoms Normal     Positional Sensitivities   Head Turning x 5 Severe dizziness  does not tolerate more than 2 repetitions   Head Nodding x 5 Severe dizziness  does not tolerate more than 2 repetitions               [facility] Adult PT Treatment/Exercise - 02/08/17 1551      Self-Care   Self-Care Other Self-Care Comments   Other Self-Care Comments  Discussed fall prevention recommending full time use of RW due to imbalance post procedure and h/o falls.  Discussed PT knowing LTG is no device, however while working on habituation, canolith repositioning, the patient may experience imbalance and will need to use RW to ensure her safety.           Vestibular Treatment/Exercise - 02/08/17 1550      Vestibular Treatment/Exercise   Vestibular Treatment Provided Canalith Repositioning   Canalith Repositioning Epley Manuever Right      EPLEY MANUEVER RIGHT   Number of Reps   1   Overall Response --  did not retest due to nausea   Response Details  Patient rested in each position approximately 2 minutes in order to allow nausea to settle at each step.  She had imbalance after standing post maneuver and PT utilized a RW and CGA for safety walking wiht patient to the car.               PT Education - 02/08/17 1552    Education provided Yes   Education Details Home safety, use of RW+ handout on nature of BPPV   Person(s) Educated Patient   Methods Explanation;Handout   Comprehension Verbalized understanding          PT Short Term Goals - 02/08/17 1553      PT SHORT TERM GOAL #1   Title The patient will be indep in HEP for habituation for horizontal head motion, slow gaze activities, and moving sit>R sidelying to improve tolerance to functional activities.   Baseline Target date 03/08/2017   Time 4   Period Weeks     PT SHORT TERM GOAL #2   Title The patient will have negative R sidelying and/or R dix hallpike testing indicating resolution of R BPPV.   Baseline Target date 03/08/2017   Time 4   Period Weeks     PT SHORT TERM GOAL #3   Title The patient will tolerate seated horizontal head turns x 5 reps with no nausea noted.   Baseline Target date 03/08/2017   Time 4   Period Weeks     PT SHORT TERM GOAL #4   Title The patient will tolerate rolling supine<>bilateral sidelying without nausea noted and change in dizziness from baseline<2/10.   Baseline Target date 03/08/2017   Time 4   Period Weeks     PT SHORT TERM GOAL #5   Title The patient will be further  assessed on gait speed and balance *needed CGA to leave clinic on eval* and goals to follow, as indicated.   Baseline Target date 03/08/2017   Time 4   Period Weeks           PT Long Term Goals - 02/08/17 1602      PT LONG TERM GOAL #1   Title The patient will reduce DHI from 68% to < or equal to 45% to demo dec'ing self perception of disability from dizziness.   Baseline Target  date 04/09/2017   Time 8   Period Weeks     PT LONG TERM GOAL #2   Title The patient will be indep with progression of HEP for balance, gait activities, gaze adaptation and habituation as indicated.   Baseline Target date 04/09/2017   Time 8   Period Weeks     PT LONG TERM GOAL #3   Title The patient will note no dizziness with bed mobility or seated head motion for improved tolerance to movement.   Baseline Target date 04/09/2017   Time 8   Period Weeks   Status Deferred     PT LONG TERM GOAL #4   Title Further Gait speed and berg goals to follow once assessment completed.   Baseline Target date 04/09/2017   Time 8   Period Weeks               Plan - 02/08/17 [id number]    Clinical Impression Statement The patient is an 83 year old female presenting to OP PT today with complex vestibular and balance deficits.  Her clinical exam is significant for abnormal oculomotor exam (saccadic pursuit noted during vertical smooth pursuits), inability to tolerate VOR testing due to nausea/dizziness, motion sensitivity worse with head motion to the right side, positive positional testing for posterior canalithiasis, abnormality of gait with imbalance post canolith repositioning, h/o falls with balance impairments.  Patient resistant to use RW and PT emphasized need for use to reduce risk for falls at this time.   PT to treat BPPV and then further assess gait/balance for improving functional mobility and reduction of falls.    Rehab Potential Good   Clinical Impairments Affecting Rehab Potential complex clinical presentation involving multiple systems   PT Frequency 2x / week   PT Duration 8 weeks  Plan to reduce to 1x/week after 4 weeks   PT Treatment/Interventions ADLs/Self Care Home Management;Canalith Repostioning;Therapeutic activities;Therapeutic exercise;Balance training;Neuromuscular re-education;Gait training;Patient/family education;Vestibular   PT Next Visit Plan Reassess positional vertigo  and treat as indicated; habituation for home (begin with seated horizontal head motion to tolerance due to nausea); safety emphasizing need for RW (*had significant loss of balance after CRT); after vertigo treated, will need gait speed and Berg testing for LTGs.    Consulted and Agree with Plan of Care Patient      Patient will benefit from skilled therapeutic intervention in order to improve the following deficits and impairments:  Abnormal gait, Decreased balance, Dizziness, Difficulty walking, Decreased mobility, Decreased strength  Visit Diagnosis: BPPV (benign paroxysmal positional vertigo), right - Plan: PT plan of care cert/re-cert  Other abnormalities of gait and mobility - Plan: PT plan of care cert/re-cert  Dizziness and giddiness - Plan: PT plan of care cert/re-cert      G-Codes - [id number] 04/27/09    Functional Assessment Tool Used Needs assist to ambulate, severe vertigo with nausea    Functional Limitation Mobility: Walking and moving around   Mobility: Walking and Moving Around  Current Status [phone]) At least 40 percent but less than 60 percent impaired, limited or restricted   Mobility: Walking and Moving Around Goal Status [phone]) At least 20 percent but less than 40 percent impaired, limited or restricted       Problem List Patient Active Problem List   Diagnosis Date Noted  . On amiodarone therapy 02/21/2016  . Acute on chronic diastolic heart failure ([place]) 10/29/2015  . Persistent atrial fibrillation ([place])   . Nausea vomiting and diarrhea 12/04/2014  . Hypoglycemia 12/03/2014  . Diabetes mellitus type 2, controlled ([place]) 12/03/2014  . CKD (chronic kidney disease) stage 3, GFR 30-59 ml/min 12/03/2014  . Hiatal hernia 12/03/2014  . GERD (gastroesophageal reflux disease) 12/03/2014  . Bronchitis, acute, with bronchospasm 06/23/2014  . Encounter for therapeutic drug monitoring 04/12/2014  . Atrial fibrillation ([place]) 11/18/2013  . Essential hypertension 11/18/2013   . Hyperlipidemia 11/18/2013  . Diabetes mellitus type 2, uncontrolled, with complications ([place]) [id number]  . Hyperaldosteronism ([place]) 11/18/2013  . Chronic diastolic heart failure ([place]) 11/18/2013    Class: Chronic    Eleanore Junio, PT 02/08/2017, 11:17 PM  [facility] [address] [place], [place], [address] Phone: [phone]   Fax:  [phone]  Name: Ariel Avila MRN: [id number] Date of Birth: [birth year]

## 2017-02-12 ENCOUNTER — Ambulatory Visit: Payer: Medicare Other | Admitting: Physical Therapy

## 2017-02-12 DIAGNOSIS — H8111 Benign paroxysmal vertigo, right ear: Secondary | ICD-10-CM

## 2017-02-12 DIAGNOSIS — R42 Dizziness and giddiness: Secondary | ICD-10-CM | POA: Diagnosis not present

## 2017-02-12 DIAGNOSIS — R2689 Other abnormalities of gait and mobility: Secondary | ICD-10-CM | POA: Diagnosis not present

## 2017-02-13 NOTE — Therapy (Signed)
[facility] [address] [place], [place], [address] Phone: [phone]   Fax:  [phone]  Physical Therapy Treatment  Patient Details  Name: Ariel Avila MRN: [id number] Date of Birth: 05/03/1930 Referring Provider: Vicie Mutters, MD  Encounter Date: 02/12/2017      PT End of Session - 02/13/17 1442    Visit Number 2   Number of Visits 12   Date for PT Re-Evaluation 04/09/17   Authorization Type G code every 10th visit   PT Start Time 1017   PT Stop Time 1100   PT Time Calculation (min) 43 min      Past Medical History:  Diagnosis Date  . Allergy   . Arthritis    "fingers and back" (06/23/2014)  . Basal cell carcinoma   . CHF (congestive heart failure) ([place])   . Chronic bronchitis ([place])    "lately" (06/23/2014)  . Chronic kidney disease (CKD), stage III (moderate)   . Dysrhythmia   . GERD (gastroesophageal reflux disease)   . H/O hiatal hernia   . Hyperlipidemia   . Hypertension   . Osteoporosis   . Paroxysmal a-fib ([place])   . Type II diabetes mellitus ([place])     Past Surgical History:  Procedure Laterality Date  . ABDOMINAL HYSTERECTOMY    . APPENDECTOMY    . BASAL CELL CARCINOMA EXCISION     "face and nose"  . CARDIOVERSION N/A 10/24/2015   Procedure: CARDIOVERSION;  Surgeon: Pixie Casino, MD;  Location: [facility] ENDOSCOPY;  Service: Cardiovascular;  Laterality: N/A;    There were no vitals filed for this visit.      Subjective Assessment - 02/12/17 1102    Subjective Pt reports "it's not any better"; states both ears are blocked up - reports it causes an echo in her left ear; pt reports she doesn't want to rely on using walker, but "I use it if I need it"   Patient is accompained by: --  son   Patient Stated Goals Get rid of dizziness so I can walk straight and turn my head.   Currently in Pain? No/denies                          Vestibular Treatment/Exercise - 02/13/17  0001      Vestibular Treatment/Exercise   Vestibular Treatment Provided Canalith Repositioning;Habituation   Canalith Repositioning Epley Manuever Right  2 reps   Habituation Exercises Seated Horizontal Head Turns;Seated Vertical Head Turns      EPLEY MANUEVER RIGHT   Number of Reps  2   Overall Response Improved Symptoms   Response Details  No nystagmus observed in      Seated Horizontal Head Turns   Number of Reps  2   Symptom Description  Pt reported nausea after performing 2 horizontal head turns to right side then to left side; 2nd rep improved with stopping in midline after head turn toward right side initially     Seated Vertical Head Turns   Number of Reps  1   Symptom Description  provoked nausea after 1 vertical head turn (looking up then down to floor)     Right sidelying test (+) for c/o vertigo but no nystagmus noted in test position;  Left sidelying test (-) for c/o vertigo and  nystagmus in test position  Pt able to tolerate 2 reps Epley maneuver for Rt BPPV with 30 sec wait time between positions  Pt  very unsteady at end of session, after positional testing, 2 reps of Epley's and seated horizontal and vertical head turns for  Habituation; pt required mod hand held assist to amb. From clinic gym to lobby, then used son's assistance to get to car (pt not using Assistive device)      PT Education - 02/13/17 1452    Education provided Yes   Education Details Instructed pt to begin seated horizontal and vertical head turns (stopping in midline for increased ease) as tolerated -2-3 reps, 2-3 times/day as tolerated   Person(s) Educated Patient   Methods Explanation;Demonstration   Comprehension Verbalized understanding          PT Short Term Goals - 02/08/17 1553      PT SHORT TERM GOAL #1   Title The patient will be indep in HEP for habituation for horizontal head motion, slow gaze activities, and moving sit>R sidelying to improve tolerance to functional  activities.   Baseline Target date 03/08/2017   Time 4   Period Weeks     PT SHORT TERM GOAL #2   Title The patient will have negative R sidelying and/or R dix hallpike testing indicating resolution of R BPPV.   Baseline Target date 03/08/2017   Time 4   Period Weeks     PT SHORT TERM GOAL #3   Title The patient will tolerate seated horizontal head turns x 5 reps with no nausea noted.   Baseline Target date 03/08/2017   Time 4   Period Weeks     PT SHORT TERM GOAL #4   Title The patient will tolerate rolling supine<>bilateral sidelying without nausea noted and change in dizziness from baseline<2/10.   Baseline Target date 03/08/2017   Time 4   Period Weeks     PT SHORT TERM GOAL #5   Title The patient will be further assessed on gait speed and balance *needed CGA to leave clinic on eval* and goals to follow, as indicated.   Baseline Target date 03/08/2017   Time 4   Period Weeks           PT Long Term Goals - 02/08/17 1602      PT LONG TERM GOAL #1   Title The patient will reduce DHI from 68% to < or equal to 45% to demo dec'ing self perception of disability from dizziness.   Baseline Target date 04/09/2017   Time 8   Period Weeks     PT LONG TERM GOAL #2   Title The patient will be indep with progression of HEP for balance, gait activities, gaze adaptation and habituation as indicated.   Baseline Target date 04/09/2017   Time 8   Period Weeks     PT LONG TERM GOAL #3   Title The patient will note no dizziness with bed mobility or seated head motion for improved tolerance to movement.   Baseline Target date 04/09/2017   Time 8   Period Weeks   Status Deferred     PT LONG TERM GOAL #4   Title Further Gait speed and berg goals to follow once assessment completed.   Baseline Target date 04/09/2017   Time 8   Period Weeks               Plan - 02/13/17 1442    Clinical Impression Statement No nystagmus noted with Epley maneuver for Rt BPPV (performed 2 reps):  pt  subjectively reported greater intensity of vertigo on 1st rep compared to that  reported on 2nd rep of Epley's.  Pt's symptoms appear to be improved  compared to that experieonced at initial eval last week, as pt did not require the 2 minute rest period during the canalith repositioning that she did on initial eval.  Rt BPPV appears to be mostly resolved but pt continues to have motion sensitivity with horizontal and vertical head turns.  Unable to reassess vertigo in Rt sidelying position at end of session due to c/o nausea and dizziness.  Pt continues to decline use of RW so mod assist needed to amb. to lobby at end of session.                                                                                                                    Rehab Potential Good   Clinical Impairments Affecting Rehab Potential complex clinical presentation involving multiple systems   PT Frequency 2x / week   PT Duration 8 weeks   PT Treatment/Interventions ADLs/Self Care Home Management;Canalith Repostioning;Therapeutic activities;Therapeutic exercise;Balance training;Neuromuscular re-education;Gait training;Patient/family education;Vestibular   PT Next Visit Plan Reassess positional vertigo and treat as indicated; habituation for home (begin with seated horizontal head motion to tolerance due to nausea); safety emphasizing need for RW (*had significant loss of balance after CRT); after vertigo treated, will need gait speed and Berg testing for LTGs.    Consulted and Agree with Plan of Care Patient;Family member/caregiver   Family Member Consulted son      Patient will benefit from skilled therapeutic intervention in order to improve the following deficits and impairments:  Abnormal gait, Decreased balance, Dizziness, Difficulty walking, Decreased mobility, Decreased strength  Visit Diagnosis: BPPV (benign paroxysmal positional vertigo), right  Dizziness and giddiness     Problem List Patient Active Problem  List   Diagnosis Date Noted  . On amiodarone therapy 02/21/2016  . Acute on chronic diastolic heart failure ([place]) 10/29/2015  . Persistent atrial fibrillation ([place])   . Nausea vomiting and diarrhea 12/04/2014  . Hypoglycemia 12/03/2014  . Diabetes mellitus type 2, controlled ([place]) 12/03/2014  . CKD (chronic kidney disease) stage 3, GFR 30-59 ml/min 12/03/2014  . Hiatal hernia 12/03/2014  . GERD (gastroesophageal reflux disease) 12/03/2014  . Bronchitis, acute, with bronchospasm 06/23/2014  . Encounter for therapeutic drug monitoring 04/12/2014  . Atrial fibrillation ([place]) 11/18/2013  . Essential hypertension 11/18/2013  . Hyperlipidemia 11/18/2013  . Diabetes mellitus type 2, uncontrolled, with complications ([place]) [id number]  . Hyperaldosteronism ([place]) 11/18/2013  . Chronic diastolic heart failure ([place]) 11/18/2013    Class: Chronic    Preslyn Warr, [place], PT 02/13/2017, 2:54 PM  [facility] [address] [place], [place], [address] Phone: [phone]   Fax:  [phone]  Name: SHAKAYA NYBORG MRN: [id number] Date of Birth: Dec 02, 1930

## 2017-02-15 ENCOUNTER — Ambulatory Visit: Payer: Medicare Other | Admitting: Rehabilitative and Restorative Service Providers"

## 2017-02-18 ENCOUNTER — Ambulatory Visit: Payer: Medicare Other | Admitting: Rehabilitative and Restorative Service Providers"

## 2017-02-18 DIAGNOSIS — R2689 Other abnormalities of gait and mobility: Secondary | ICD-10-CM

## 2017-02-18 DIAGNOSIS — R42 Dizziness and giddiness: Secondary | ICD-10-CM | POA: Diagnosis not present

## 2017-02-18 DIAGNOSIS — H8111 Benign paroxysmal vertigo, right ear: Secondary | ICD-10-CM

## 2017-02-18 NOTE — Therapy (Signed)
[facility] [address] [place], [place], [address] Phone: [phone]   Fax:  [phone]  Physical Therapy Treatment  Patient Details  Name: Ariel Avila MRN: [id number] Date of Birth: 1930/12/12 Referring Provider: Vicie Mutters, MD  Encounter Date: 02/18/2017      PT End of Session - 02/18/17 1425    Visit Number 3   Number of Visits 12   Date for PT Re-Evaluation 04/09/17   Authorization Type G code every 10th visit   PT Start Time 1230   PT Stop Time 1315   PT Time Calculation (min) 45 min   Equipment Utilized During Treatment Gait belt   Activity Tolerance Patient tolerated treatment well   Behavior During Therapy [facility] for tasks assessed/performed      Past Medical History:  Diagnosis Date  . Allergy   . Arthritis    "fingers and back" (06/23/2014)  . Basal cell carcinoma   . CHF (congestive heart failure) ([place])   . Chronic bronchitis ([place])    "lately" (06/23/2014)  . Chronic kidney disease (CKD), stage III (moderate)   . Dysrhythmia   . GERD (gastroesophageal reflux disease)   . H/O hiatal hernia   . Hyperlipidemia   . Hypertension   . Osteoporosis   . Paroxysmal a-fib ([place])   . Type II diabetes mellitus ([place])     Past Surgical History:  Procedure Laterality Date  . ABDOMINAL HYSTERECTOMY    . APPENDECTOMY    . BASAL CELL CARCINOMA EXCISION     "face and nose"  . CARDIOVERSION N/A 10/24/2015   Procedure: CARDIOVERSION;  Surgeon: Pixie Casino, MD;  Location: [facility] ENDOSCOPY;  Service: Cardiovascular;  Laterality: N/A;    There were no vitals filed for this visit.      Subjective Assessment - 02/18/17 1235    Subjective The patient reports it takes an hour for balance to improve after therapy.  She still notes an "echo" sensation in her R ear and she feels like her L ear "blocks" when this occurs.     Patient Stated Goals Get rid of dizziness so I can walk straight and turn my head.   Currently in Pain? No/denies            [facility] PT Assessment - 02/18/17 1305      Ambulation/Gait   Ambulation/Gait Yes   Ambulation/Gait Assistance 4: Min assist   Ambulation/Gait Assistance Details 200   Gait velocity 1.65 ft/sec   Gait Comments gait activiites with hand held assist x 200 ft with difficulty performing small turns in the clinic to go down a hall (requires min A to steady herself).            Vestibular Assessment - 02/18/17 1241      Vestibular Assessment   General Observation Patient ambulates into clinic without RW today.   She notes her dizziness is less with turning right and left, but she continues to go slow with movements.                    Vestibular Treatment/Exercise - 02/18/17 1247      Vestibular Treatment/Exercise   Vestibular Treatment Provided Habituation;Gaze   Habituation Exercises Horizontal Roll;Brandt Daroff;Seated Vertical Head Turns;Seated Diagonal Head Turns   Gaze Exercises X1 Viewing Horizontal;Eye/Head Exercise Horizontal     Nestor Lewandowsky   Number of Reps  2   Symptom Description  worst dizziness with return to sit from left side; overall  symptoms habituate for 2nd rep demonstrating some fatiguability     Horizontal Roll   Number of Reps  4   Symptom Description  Provided for HEP with instructions to let symptoms settle and to remain sitting x 15 minutes after completing exercises.    patient notes 4/10 symptoms with rolling; settles after 66min     Seated Horizontal Head Turns   Number of Reps  5   Symptom Description  No dizziness today.       Seated Vertical Head Turns   Number of Reps  3   Symptom Description  vertical head motion produces a "quiver" of stomach upset; tolerates 3 reps and then stops after 5/10.       X1 Viewing Horizontal   Foot Position seated   Comments performed 5 repetitions x 2 sets, encouraged patient to work up to 30 seconds as tolerated for HEP.               PT  Education - 02/18/17 1424    Education provided Yes   Education Details HEP: habituation (horizontal rolling, seated horizontal head motion, seated vertical head motion), gaze x 1 adaptation   Person(s) Educated Patient   Methods Explanation;Demonstration;Handout   Comprehension Verbalized understanding;Returned demonstration          PT Short Term Goals - 02/08/17 1553      PT SHORT TERM GOAL #1   Title The patient will be indep in HEP for habituation for horizontal head motion, slow gaze activities, and moving sit>R sidelying to improve tolerance to functional activities.   Baseline Target date 03/08/2017   Time 4   Period Weeks     PT SHORT TERM GOAL #2   Title The patient will have negative R sidelying and/or R dix hallpike testing indicating resolution of R BPPV.   Baseline Target date 03/08/2017   Time 4   Period Weeks     PT SHORT TERM GOAL #3   Title The patient will tolerate seated horizontal head turns x 5 reps with no nausea noted.   Baseline Target date 03/08/2017   Time 4   Period Weeks     PT SHORT TERM GOAL #4   Title The patient will tolerate rolling supine<>bilateral sidelying without nausea noted and change in dizziness from baseline<2/10.   Baseline Target date 03/08/2017   Time 4   Period Weeks     PT SHORT TERM GOAL #5   Title The patient will be further assessed on gait speed and balance *needed CGA to leave clinic on eval* and goals to follow, as indicated.   Baseline Target date 03/08/2017   Time 4   Period Weeks           PT Long Term Goals - 02/18/17 1431      PT LONG TERM GOAL #1   Title The patient will reduce DHI from 68% to < or equal to 45% to demo dec'ing self perception of disability from dizziness.   Baseline Target date 04/09/2017   Time 8   Period Weeks     PT LONG TERM GOAL #2   Title The patient will be indep with progression of HEP for balance, gait activities, gaze adaptation and habituation as indicated.   Baseline Target date  04/09/2017   Time 8   Period Weeks     PT LONG TERM GOAL #3   Title The patient will note no dizziness with bed mobility or seated head motion for improved tolerance  to movement.   Baseline Target date 04/09/2017   Time 8   Period Weeks   Status Deferred     PT LONG TERM GOAL #4   Title Further Gait speed and berg goals to follow once assessment completed.   Baseline Target date 04/09/2017  *Gait speed=1.65 ft/sec on 2/19*   Time 8   Period Weeks               Plan - 02/18/17 1429    Clinical Impression Statement The patient is tolerating increased activity at today's visit including seated head motion in horizontal and vertical plane and VOR.  She also performed rolling and sit<>sidelying for habituation and did have a decrease in symptoms with repetition.  PT performed 3 habituation + gaze x 1 for HEP.  Patient needed assist to walk following habituation and we worked on gait using visual targets to improve steadiness.    Clinical Impairments Affecting Rehab Potential complex clinical presentation involving multiple systems   PT Treatment/Interventions ADLs/Self Care Home Management;Canalith Repostioning;Therapeutic activities;Therapeutic exercise;Balance training;Neuromuscular re-education;Gait training;Patient/family education;Vestibular   PT Next Visit Plan Habituation, treat with CRT if indicated, VOR, standing balance in corner progressing head motion to standing as able, Berg? add LTG.   Consulted and Agree with Plan of Care Patient      Patient will benefit from skilled therapeutic intervention in order to improve the following deficits and impairments:  Abnormal gait, Decreased balance, Dizziness, Difficulty walking, Decreased mobility, Decreased strength  Visit Diagnosis: BPPV (benign paroxysmal positional vertigo), right  Dizziness and giddiness  Other abnormalities of gait and mobility     Problem List Patient Active Problem List   Diagnosis Date Noted  .  On amiodarone therapy 02/21/2016  . Acute on chronic diastolic heart failure ([place]) 10/29/2015  . Persistent atrial fibrillation ([place])   . Nausea vomiting and diarrhea 12/04/2014  . Hypoglycemia 12/03/2014  . Diabetes mellitus type 2, controlled ([place]) 12/03/2014  . CKD (chronic kidney disease) stage 3, GFR 30-59 ml/min 12/03/2014  . Hiatal hernia 12/03/2014  . GERD (gastroesophageal reflux disease) 12/03/2014  . Bronchitis, acute, with bronchospasm 06/23/2014  . Encounter for therapeutic drug monitoring 04/12/2014  . Atrial fibrillation ([place]) 11/18/2013  . Essential hypertension 11/18/2013  . Hyperlipidemia 11/18/2013  . Diabetes mellitus type 2, uncontrolled, with complications ([place]) 123XX123  . Hyperaldosteronism ([place]) 11/18/2013  . Chronic diastolic heart failure ([place]) 11/18/2013    Class: Chronic    Casyn Becvar, PT 02/18/2017, 2:32 PM  [facility] [address] [place], [place], [address] Phone: [phone]   Fax:  [phone]  Name: Ariel Avila MRN: [id number] Date of Birth: 1930-12-15

## 2017-02-18 NOTE — Patient Instructions (Signed)
Safety tips to think about BEFORE exercise: 1) Plan to sit for 15 minutes after finishing these activities to allow time for imbalance to settle. 2) Use rest room before doing, so that you don't have to get up right after doing exercises. 3) Have water, remote, phone nearby so you don't have to get up right after doing exercises. 4) Consider using your walker if you feel unsteady.   Habituation - Tip Card  1.The goal of habituation training is to assist in decreasing symptoms of vertigo, dizziness, or nausea provoked by specific head and body motions. 2.These exercises may initially increase symptoms; however, be persistent and work through symptoms. With repetition and time, the exercises will assist in reducing or eliminating symptoms. 3.Exercises should be stopped and discussed with the therapist if you experience any of the following: - Sudden change or fluctuation in hearing - New onset of ringing in the ears, or increase in current intensity - Any fluid discharge from the ear - Severe pain in neck or back - Extreme nausea  Copyright  VHI. All rights reserved.   Habituation - Rolling   With pillow under head, start on back. Roll to your right side.  Hold until dizziness stops, plus 20 seconds and then roll to the left side.  Hold until dizziness stops, plus 20 seconds.  Repeat sequence 3 times per session. Do 2 sessions per day.  Copyright  VHI. All rights reserved.   Head Motion: Side to Side    Sitting, tilt head down slightly, slowly move head to right with eyes open. Then, move head slowly to opposite side. Hold position until symptoms subside. Repeat __5__ times at self regulated pace. Do __2__ sessions per day.  Copyright  VHI. All rights reserved.   Head Motion: Up and Down    Sitting, slowly move head up with eyes open. Then, move head in opposite direction.  Repeat _5___ times at self regulated pace. Do __2__ sessions per day.  Copyright  VHI. All rights  reserved.   Gaze Stabilization: Sitting    Keeping eyes on target on wall 3 feet away, and move head side to side for _30__ seconds. Do __2-3__ sessions per day.  Copyright  VHI. All rights reserved.   Gaze Stabilization: Tip Card  1.Target must remain in focus, not blurry, and appear stationary while head is in motion. 2.Perform exercises with small head movements (45 to either side of midline). 3.Increase speed of head motion so long as target is in focus. 4.If you wear eyeglasses, be sure you can see target through lens (therapist will give specific instructions for bifocal / progressive lenses). 5.These exercises may provoke dizziness or nausea. Work through these symptoms. If too dizzy, slow head movement slightly. Rest between each exercise. 6.Exercises demand concentration; avoid distractions.  Copyright  VHI. All rights reserved.   Special Instructions: Exercises may bring on mild to moderate symptoms of dizziness or nausea that resolve within 30 minutes of completing exercises. If symptoms are lasting longer than 30 minutes, modify your exercises by:  >decreasing the # of times you complete each activity >ensuring your symptoms return to baseline before moving onto the next exercise >dividing up exercises so you do not do them all in one session, but multiple short sessions throughout the day >doing them once a day until symptoms improve

## 2017-02-21 DIAGNOSIS — M1712 Unilateral primary osteoarthritis, left knee: Secondary | ICD-10-CM | POA: Diagnosis not present

## 2017-02-21 DIAGNOSIS — M25562 Pain in left knee: Secondary | ICD-10-CM | POA: Diagnosis not present

## 2017-02-22 ENCOUNTER — Ambulatory Visit: Payer: Medicare Other | Admitting: Rehabilitative and Restorative Service Providers"

## 2017-02-22 DIAGNOSIS — H8111 Benign paroxysmal vertigo, right ear: Secondary | ICD-10-CM | POA: Diagnosis not present

## 2017-02-22 DIAGNOSIS — R2689 Other abnormalities of gait and mobility: Secondary | ICD-10-CM

## 2017-02-22 DIAGNOSIS — R42 Dizziness and giddiness: Secondary | ICD-10-CM | POA: Diagnosis not present

## 2017-02-22 NOTE — Therapy (Signed)
[facility] [address] [place], [place], [address] Phone: [phone]   Fax:  [phone]  Physical Therapy Treatment  Patient Details  Name: Ariel Avila MRN: [id number] Date of Birth: [birth year] Referring Provider: Vicie Mutters, MD  Encounter Date: 02/22/2017      PT End of Session - 02/22/17 1456    Visit Number 4   Number of Visits 12   Date for PT Re-Evaluation 04/09/17   Authorization Type G code every 10th visit   PT Start Time 1405   PT Stop Time 1450   PT Time Calculation (min) 45 min   Equipment Utilized During Treatment Gait belt   Activity Tolerance Patient tolerated treatment well   Behavior During Therapy [facility] for tasks assessed/performed      Past Medical History:  Diagnosis Date  . Allergy   . Arthritis    "fingers and back" (06/23/2014)  . Basal cell carcinoma   . CHF (congestive heart failure) ([place])   . Chronic bronchitis ([place])    "lately" (06/23/2014)  . Chronic kidney disease (CKD), stage III (moderate)   . Dysrhythmia   . GERD (gastroesophageal reflux disease)   . H/O hiatal hernia   . Hyperlipidemia   . Hypertension   . Osteoporosis   . Paroxysmal a-fib ([place])   . Type II diabetes mellitus ([place])     Past Surgical History:  Procedure Laterality Date  . ABDOMINAL HYSTERECTOMY    . APPENDECTOMY    . BASAL CELL CARCINOMA EXCISION     "face and nose"  . CARDIOVERSION N/A 10/24/2015   Procedure: CARDIOVERSION;  Surgeon: Pixie Casino, MD;  Location: [facility] ENDOSCOPY;  Service: Cardiovascular;  Laterality: N/A;    There were no vitals filed for this visit.      Subjective Assessment - 02/22/17 1409    Subjective The patient reports she can sleep on her right side.  She is still having difficulty intermittently worse when looking up/down, occasional queasiness with horizontal head motion.  Quick head turns still provoke symtpoms.   The patient reports that she had a fall the other day  due to L knee pain.  She notes that she had to roll to get up and that provoked symptoms.   Patient is accompained by: Family member  daughter attends/husband in lobby   Patient Stated Goals Get rid of dizziness so I can walk straight and turn my head.   Currently in Pain? No/denies            [facility] PT Assessment - 02/22/17 1437      Ambulation/Gait   Ambulation/Gait Yes   Gait velocity 2.27 ft/sec     Standardized Balance Assessment   Standardized Balance Assessment Berg Balance Test     Berg Balance Test   Sit to Stand Able to stand using hands after several tries   Standing Unsupported Able to stand 2 minutes with supervision   Sitting with Back Unsupported but Feet Supported on Floor or Stool Able to sit safely and securely 2 minutes   Stand to Sit Controls descent by using hands   Transfers Able to transfer safely, definite need of hands   Standing Unsupported with Eyes Closed Able to stand 10 seconds with supervision   Standing Ubsupported with Feet Together Needs help to attain position and unable to hold for 15 seconds   From Standing, Reach Forward with Outstretched Arm Loses balance while trying/requires external support   From Standing Position, Pick up  Object from Floor Unable to try/needs assist to keep balance   From Standing Position, Turn to Look Behind Over each Shoulder Needs assist to keep from losing balance and falling   Turn 360 Degrees Needs assistance while turning   Standing Unsupported, Alternately Place Feet on Step/Stool Able to complete >2 steps/needs minimal assist   Standing Unsupported, One Foot in [place] help to step but can hold 15 seconds   Standing on One Leg Unable to try or needs assist to prevent fall   Total Score 20   Berg comment: 20/56 indicating high risk for falls.  Patient will not use assistive device at this time.            Vestibular Assessment - 02/22/17 1417      Vestibular Assessment   General Observation Patient  ambualtes into clinic without device     Symptom Behavior   Type of Dizziness Spinning   Frequency of Dizziness intermittent   Duration of Dizziness comes and goes     Sidelying Right   Sidelying Right Duration none   Sidelying Right Symptoms No nystagmus     Sidelying Left   Sidelying Left Duration no nystagmus viewed moving onto left side, notes queasy sensation with return to sitting   Sidelying Left Symptoms No nystagmus                 [facility] Adult PT Treatment/Exercise - 02/22/17 1437      Ambulation/Gait   Gait Comments Gait activities walking x 200 feet x 2 reps, backwards walking with CGA, heel and toe walking x 10 feet x 4 reps with intermittent UE support and CGA.         Vestibular Treatment/Exercise - 02/22/17 1502      Vestibular Treatment/Exercise   Vestibular Treatment Provided Habituation   Habituation Exercises Seated Horizontal Head Turns;Seated Vertical Head Turns;Standing Horizontal Head Turns;Brandt Daroff     Brandt Daroff   Number of Reps  3   Symptom Description  worst with return to sitting from L side     Seated Horizontal Head Turns   Number of Reps  5   Symptom Description  mild dizziness today; rests x 2 minutes for sympotms to settle     Seated Vertical Head Turns   Number of Reps  3   Symptom Description  vertical head motion provokes mild dizziness, no nausea today     Standing Horizontal Head Turns   Number of Reps  5   Symptom Description  in corner with feet apart; needs min A to move to chair to sit due to significant dizziness               PT Education - 02/22/17 1455    Education provided Yes   Education Details reviewed fall prevention/safety concerns and recommended continue current HEP ensuring rest period x 15 minutes afterward to decrease risk for falls.   Person(s) Educated Patient;Child(ren)   Methods Explanation   Comprehension Verbalized understanding          PT Short Term Goals - 02/22/17 1456       PT SHORT TERM GOAL #1   Title The patient will be indep in HEP for habituation for horizontal head motion, slow gaze activities, and moving sit>R sidelying to improve tolerance to functional activities.   Baseline Met on 02/22/2017   Time 4   Period Weeks   Status Achieved     PT SHORT TERM GOAL #2  Title The patient will have negative R sidelying and/or R dix hallpike testing indicating resolution of R BPPV.   Baseline Target date 03/08/2017   Time 4   Period Weeks     PT SHORT TERM GOAL #3   Title The patient will tolerate seated horizontal head turns x 5 reps with no nausea noted.   Baseline Patient continues with mild dizziness, but no nausea at this time (02/22/2017)   Time 4   Period Weeks   Status Achieved     PT SHORT TERM GOAL #4   Title The patient will tolerate rolling supine<>bilateral sidelying without nausea noted and change in dizziness from baseline<2/10.   Baseline Target date 03/08/2017   Time 4   Period Weeks     PT SHORT TERM GOAL #5   Title The patient will be further assessed on gait speed and balance *needed CGA to leave clinic on eval* and goals to follow, as indicated.   Baseline Gait speed=2.27 ft/sec, Berg=20/56   Time 4   Period Weeks   Status Achieved           PT Long Term Goals - 02/22/17 1458      PT LONG TERM GOAL #1   Title The patient will reduce DHI from 68% to < or equal to 45% to demo dec'ing self perception of disability from dizziness.   Baseline Target date 04/09/2017   Time 8   Period Weeks     PT LONG TERM GOAL #2   Title The patient will be indep with progression of HEP for balance, gait activities, gaze adaptation and habituation as indicated.   Baseline Target date 04/09/2017   Time 8   Period Weeks     PT LONG TERM GOAL #3   Title The patient will note no dizziness with bed mobility or seated head motion for improved tolerance to movement.   Baseline Target date 04/09/2017   Time 8   Period Weeks   Status Deferred      PT LONG TERM GOAL #4   Title Further Gait speed and berg goals to follow once assessment completed.   Baseline Target date 04/09/2017  *Gait speed=1.65 ft/sec on 2/19*   Time 8   Period Weeks   Status Achieved     PT LONG TERM GOAL #5   Title Improve gait speed from 2.27 ft/sec (on 2/23) up to > or equal to 2/5 ft/sec to demo improving functional moblity.   Baseline Target date 04/09/2017   Time 8   Period Weeks   Status New     Additional Long Term Goals   Additional Long Term Goals Yes     PT LONG TERM GOAL #6   Title The patient will improve Berg from 20/56 up to > or equal to 26/56 to demo decreased risk for falls.   Baseline Target date 04/09/2017   Time 8   Period Weeks   Status New               Plan - 02/22/17 1500    Clinical Impression Statement The patient is meeting some STGs with improved mobility, however she continues with multi-factorial fall risk and refusal to use assistive device.  PT and patient discussed oculomotor issues with her noting a work-up (found notes from 2001) for double vision and syncope.  This may explain abnormal oculomotor exam and worsening balance from peripheral vertigo as she cannot correct with visual system due to not fully intact.  Continue  to STGs/LTGs.    Clinical Impairments Affecting Rehab Potential complex clinical presentation involving multiple systems   PT Frequency 2x / week   PT Duration 8 weeks   PT Treatment/Interventions ADLs/Self Care Home Management;Canalith Repostioning;Therapeutic activities;Therapeutic exercise;Balance training;Neuromuscular re-education;Gait training;Patient/family education;Vestibular   PT Next Visit Plan L knee HEP due to falls from weakness/pain, balance exercises in corner, countertop balance activities; gait training, habituation and gaze   Consulted and Agree with Plan of Care Patient;Family member/caregiver   Family Member Consulted daughter present      Patient will benefit from  skilled therapeutic intervention in order to improve the following deficits and impairments:  Abnormal gait, Decreased balance, Dizziness, Difficulty walking, Decreased mobility, Decreased strength  Visit Diagnosis: Dizziness and giddiness  Other abnormalities of gait and mobility     Problem List Patient Active Problem List   Diagnosis Date Noted  . On amiodarone therapy 02/21/2016  . Acute on chronic diastolic heart failure ([place]) 10/29/2015  . Persistent atrial fibrillation ([place])   . Nausea vomiting and diarrhea 12/04/2014  . Hypoglycemia 12/03/2014  . Diabetes mellitus type 2, controlled ([place]) 12/03/2014  . CKD (chronic kidney disease) stage 3, GFR 30-59 ml/min 12/03/2014  . Hiatal hernia 12/03/2014  . GERD (gastroesophageal reflux disease) 12/03/2014  . Bronchitis, acute, with bronchospasm 06/23/2014  . Encounter for therapeutic drug monitoring 04/12/2014  . Atrial fibrillation ([place]) 11/18/2013  . Essential hypertension 11/18/2013  . Hyperlipidemia 11/18/2013  . Diabetes mellitus type 2, uncontrolled, with complications ([place]) 68/34/1962  . Hyperaldosteronism ([place]) 11/18/2013  . Chronic diastolic heart failure ([place]) 11/18/2013    Class: Chronic    Daltyn Degroat, PT 02/22/2017, 3:04 PM  [facility] [address] [place], [place], [address] Phone: [phone]   Fax:  [phone]  Name: JAIANNA NICOLL MRN: [id number] Date of Birth: 1930-05-15

## 2017-02-25 ENCOUNTER — Ambulatory Visit: Payer: Medicare Other | Admitting: Rehabilitative and Restorative Service Providers"

## 2017-02-25 DIAGNOSIS — N189 Chronic kidney disease, unspecified: Secondary | ICD-10-CM | POA: Diagnosis not present

## 2017-02-25 DIAGNOSIS — Z7901 Long term (current) use of anticoagulants: Secondary | ICD-10-CM | POA: Diagnosis not present

## 2017-02-25 DIAGNOSIS — R2689 Other abnormalities of gait and mobility: Secondary | ICD-10-CM

## 2017-02-25 DIAGNOSIS — E1165 Type 2 diabetes mellitus with hyperglycemia: Secondary | ICD-10-CM | POA: Diagnosis not present

## 2017-02-25 DIAGNOSIS — R42 Dizziness and giddiness: Secondary | ICD-10-CM

## 2017-02-25 DIAGNOSIS — E1142 Type 2 diabetes mellitus with diabetic polyneuropathy: Secondary | ICD-10-CM | POA: Diagnosis not present

## 2017-02-25 DIAGNOSIS — H8111 Benign paroxysmal vertigo, right ear: Secondary | ICD-10-CM

## 2017-02-25 DIAGNOSIS — I10 Essential (primary) hypertension: Secondary | ICD-10-CM | POA: Diagnosis not present

## 2017-02-25 DIAGNOSIS — E785 Hyperlipidemia, unspecified: Secondary | ICD-10-CM | POA: Diagnosis not present

## 2017-02-25 DIAGNOSIS — Z794 Long term (current) use of insulin: Secondary | ICD-10-CM | POA: Diagnosis not present

## 2017-02-25 NOTE — Patient Instructions (Signed)
Straight Leg Raise    Bend one leg. Raise other leg _8__ inches with knee locked. Exhale and tighten thigh muscles while raising leg. Repeat with other leg. Repeat _10___ times. Do _2___ sessions per day.  http://gt2.exer.us/270   Copyright  VHI. All rights reserved.   IT Band: Knee Across Body    Lie on back, shoulders on surface. Stretch right knee up and across body. Hold _20__ seconds. Relax. Repeat _3__ times. Do _2__ times a day. Repeat with other leg.    Copyright  VHI. All rights reserved.   Chair Sitting    Sit at edge of seat, spine straight, one leg extended. Put a hand on each thigh and bend forward from the hip, keeping spine straight.  Hold __20_ seconds. Repeat _3__ times per session. Do __2_ sessions per day.  Copyright  VHI. All rights reserved.   **This is an example of a stability shoe for overpronation, which would help with knee position.  You may be able to find cheaper somewhere.  Look at the arch support on the sole of this shoe--it is thicker and a more dense material for support.  If you want to try some on in a store, [employer] on [address] is owned by a PT, and they carry a lot of shoes for stability.

## 2017-02-26 NOTE — Therapy (Signed)
[facility] [address] [place], [place], [address] Phone: [phone]   Fax:  [phone]  Physical Therapy Treatment  Patient Details  Name: Ariel Avila MRN: [id number] Date of Birth: 1930-09-24 Referring Provider: Vicie Mutters, MD  Encounter Date: 02/25/2017      PT End of Session - 02/25/17 1123    Visit Number 5   Number of Visits 12   Date for PT Re-Evaluation 04/09/17   Authorization Type G code every 10th visit   PT Start Time 1235   PT Stop Time 1315   PT Time Calculation (min) 40 min   Equipment Utilized During Treatment Gait belt   Activity Tolerance Patient tolerated treatment well   Behavior During Therapy [facility] for tasks assessed/performed      Past Medical History:  Diagnosis Date  . Allergy   . Arthritis    "fingers and back" (06/23/2014)  . Basal cell carcinoma   . CHF (congestive heart failure) ([place])   . Chronic bronchitis ([place])    "lately" (06/23/2014)  . Chronic kidney disease (CKD), stage III (moderate)   . Dysrhythmia   . GERD (gastroesophageal reflux disease)   . H/O hiatal hernia   . Hyperlipidemia   . Hypertension   . Osteoporosis   . Paroxysmal a-fib ([place])   . Type II diabetes mellitus ([place])     Past Surgical History:  Procedure Laterality Date  . ABDOMINAL HYSTERECTOMY    . APPENDECTOMY    . BASAL CELL CARCINOMA EXCISION     "face and nose"  . CARDIOVERSION N/A 10/24/2015   Procedure: CARDIOVERSION;  Surgeon: Pixie Casino, MD;  Location: [facility] ENDOSCOPY;  Service: Cardiovascular;  Laterality: N/A;    There were no vitals filed for this visit.      Subjective Assessment - 02/25/17 1238    Subjective The patient notes that she didn't sleep well last night b/c her husband woke up at 2am, therefore she notes a little more imbalance and dizziness today.  She saw her primary MD today and notes she is doing better.   The patient reports she is no longer dizzy with head motion  except when looking up (she notes this has been present for years).   Patient Stated Goals Get rid of dizziness so I can walk straight and turn my head.   Currently in Pain? No/denies                Vestibular Assessment - 02/26/17 1129      Vestibular Assessment   General Observation patient notes no dizziness with activities at home.     Positional Testing   Sidelying Test Sidelying Right;Sidelying Left     Sidelying Right   Sidelying Right Duration none   Sidelying Right Symptoms No nystagmus     Sidelying Left   Sidelying Left Duration none   Sidelying Left Symptoms No nystagmus                 [facility] Adult PT Treatment/Exercise - 02/25/17 1245      Ambulation/Gait   Ambulation/Gait Yes   Ambulation/Gait Assistance 6: Modified independent (Device/Increase time)  slowed pace   Ambulation Distance (Feet) 250 Feet  x 3 reps   Ambulation Surface Level     Exercises   Exercises Knee/Hip     Knee/Hip Exercises: Stretches   Active Hamstring Stretch Right;Left;2 reps;30 seconds   Active Hamstring Stretch Limitations seated   ITB Stretch 2 reps   ITB  Stretch Limitations sidelying L ITB stretch, supine ITB stretch     Knee/Hip Exercises: Seated   Long Arc Quad AROM;5 reps   Long Arc Quad Limitations pain, therefore stopped exercise.     Knee/Hip Exercises: Supine   Quad Sets 5 reps   Straight Leg Raises Strengthening;Left;10 reps   Straight Leg Raises Limitations Patient needs cues for quad set.         SELF CARE/HOME MANAGEMENT: Discussed shoewear and knee positioning showing examples of stability shoes with pronator support.          PT Education - 02/25/17 1311    Education provided Yes   Education Details HEP: SLR, IT band stretch, hamstring stretch, shoewear   Person(s) Educated Patient   Methods Explanation;Demonstration;Handout   Comprehension Returned demonstration;Verbalized understanding          PT Short Term Goals -  02/25/17 1257      PT SHORT TERM GOAL #1   Title The patient will be indep in HEP for habituation for horizontal head motion, slow gaze activities, and moving sit>R sidelying to improve tolerance to functional activities.   Baseline Met on 02/22/2017   Time 4   Period Weeks   Status Achieved     PT SHORT TERM GOAL #2   Title The patient will have negative R sidelying and/or R dix hallpike testing indicating resolution of R BPPV.   Baseline Met on 02/25/2017   Time 4   Period Weeks   Status Achieved     PT SHORT TERM GOAL #3   Title The patient will tolerate seated horizontal head turns x 5 reps with no nausea noted.   Baseline Patient continues with mild dizziness, but no nausea at this time (02/22/2017)   Time 4   Period Weeks   Status Achieved     PT SHORT TERM GOAL #4   Title The patient will tolerate rolling supine<>bilateral sidelying without nausea noted and change in dizziness from baseline<2/10.   Baseline Met on 02/25/2017   Time 4   Period Weeks   Status Achieved     PT SHORT TERM GOAL #5   Title The patient will be further assessed on gait speed and balance *needed CGA to leave clinic on eval* and goals to follow, as indicated.   Baseline Gait speed=2.27 ft/sec, Berg=20/56   Time 4   Period Weeks   Status Achieved           PT Long Term Goals - 02/22/17 1458      PT LONG TERM GOAL #1   Title The patient will reduce DHI from 68% to < or equal to 45% to demo dec'ing self perception of disability from dizziness.   Baseline Target date 04/09/2017   Time 8   Period Weeks     PT LONG TERM GOAL #2   Title The patient will be indep with progression of HEP for balance, gait activities, gaze adaptation and habituation as indicated.   Baseline Target date 04/09/2017   Time 8   Period Weeks     PT LONG TERM GOAL #3   Title The patient will note no dizziness with bed mobility or seated head motion for improved tolerance to movement.   Baseline Target date 04/09/2017    Time 8   Period Weeks   Status Deferred     PT LONG TERM GOAL #4   Title Further Gait speed and berg goals to follow once assessment completed.   Baseline Target date  04/09/2017  *Gait speed=1.65 ft/sec on 2/19*   Time 8   Period Weeks   Status Achieved     PT LONG TERM GOAL #5   Title Improve gait speed from 2.27 ft/sec (on 2/23) up to > or equal to 2/5 ft/sec to demo improving functional moblity.   Baseline Target date 04/09/2017   Time 8   Period Weeks   Status New     Additional Long Term Goals   Additional Long Term Goals Yes     PT LONG TERM GOAL #6   Title The patient will improve Berg from 20/56 up to > or equal to 26/56 to demo decreased risk for falls.   Baseline Target date 04/09/2017   Time 8   Period Weeks   Status New               Plan - 02/26/17 1123    Clinical Impression Statement The patient met STGs.  She is making progress with dizziness and mobility and PT is emphasizing LE strengthening to reduce risk for falls (due to reports of knee buckling with h/o pain).   PT Treatment/Interventions ADLs/Self Care Home Management;Canalith Repostioning;Therapeutic activities;Therapeutic exercise;Balance training;Neuromuscular re-education;Gait training;Patient/family education;Vestibular   PT Next Visit Plan check HEP; countertop balance activities (otago components), gait training, habituation, gaze.   Consulted and Agree with Plan of Care Patient      Patient will benefit from skilled therapeutic intervention in order to improve the following deficits and impairments:  Abnormal gait, Decreased balance, Dizziness, Difficulty walking, Decreased mobility, Decreased strength  Visit Diagnosis: Dizziness and giddiness  Other abnormalities of gait and mobility  BPPV (benign paroxysmal positional vertigo), right     Problem List Patient Active Problem List   Diagnosis Date Noted  . On amiodarone therapy 02/21/2016  . Acute on chronic diastolic heart  failure ([place]) 10/29/2015  . Persistent atrial fibrillation ([place])   . Nausea vomiting and diarrhea 12/04/2014  . Hypoglycemia 12/03/2014  . Diabetes mellitus type 2, controlled ([place]) 12/03/2014  . CKD (chronic kidney disease) stage 3, GFR 30-59 ml/min 12/03/2014  . Hiatal hernia 12/03/2014  . GERD (gastroesophageal reflux disease) 12/03/2014  . Bronchitis, acute, with bronchospasm 06/23/2014  . Encounter for therapeutic drug monitoring 04/12/2014  . Atrial fibrillation ([place]) 11/18/2013  . Essential hypertension 11/18/2013  . Hyperlipidemia 11/18/2013  . Diabetes mellitus type 2, uncontrolled, with complications ([place]) 46/96/2952  . Hyperaldosteronism ([place]) 11/18/2013  . Chronic diastolic heart failure ([place]) 11/18/2013    Class: Chronic    Carols Clemence, PT 02/26/2017, 11:30 AM  [facility] [address] [place], [place], [address] Phone: [phone]   Fax:  [phone]  Name: Ariel Avila MRN: [id number] Date of Birth: [birth year]

## 2017-03-01 ENCOUNTER — Ambulatory Visit: Payer: Medicare Other | Attending: Otolaryngology | Admitting: Rehabilitative and Restorative Service Providers"

## 2017-03-01 DIAGNOSIS — M6281 Muscle weakness (generalized): Secondary | ICD-10-CM | POA: Diagnosis not present

## 2017-03-01 DIAGNOSIS — M25562 Pain in left knee: Secondary | ICD-10-CM | POA: Diagnosis not present

## 2017-03-01 DIAGNOSIS — R2689 Other abnormalities of gait and mobility: Secondary | ICD-10-CM | POA: Diagnosis not present

## 2017-03-01 DIAGNOSIS — R42 Dizziness and giddiness: Secondary | ICD-10-CM | POA: Insufficient documentation

## 2017-03-01 DIAGNOSIS — H8111 Benign paroxysmal vertigo, right ear: Secondary | ICD-10-CM | POA: Insufficient documentation

## 2017-03-03 NOTE — Therapy (Signed)
[facility] [address] [place], [place], [address] Phone: [phone]   Fax:  [phone]  Physical Therapy Treatment  Patient Details  Name: Ariel Avila MRN: [id number] Date of Birth: 1930/03/17 Referring Provider: Vicie Mutters, MD  Encounter Date: 03/01/2017      PT End of Session - 03/03/17 2146    Visit Number 6   Number of Visits 12   Date for PT Re-Evaluation 04/09/17   Authorization Type G code every 10th visit   PT Start Time 1455   PT Stop Time 1534   PT Time Calculation (min) 39 min   Equipment Utilized During Treatment Gait belt   Activity Tolerance Patient tolerated treatment well   Behavior During Therapy [facility] for tasks assessed/performed      Past Medical History:  Diagnosis Date  . Allergy   . Arthritis    "fingers and back" (06/23/2014)  . Basal cell carcinoma   . CHF (congestive heart failure) ([place])   . Chronic bronchitis ([place])    "lately" (06/23/2014)  . Chronic kidney disease (CKD), stage III (moderate)   . Dysrhythmia   . GERD (gastroesophageal reflux disease)   . H/O hiatal hernia   . Hyperlipidemia   . Hypertension   . Osteoporosis   . Paroxysmal a-fib ([place])   . Type II diabetes mellitus ([place])     Past Surgical History:  Procedure Laterality Date  . ABDOMINAL HYSTERECTOMY    . APPENDECTOMY    . BASAL CELL CARCINOMA EXCISION     "face and nose"  . CARDIOVERSION N/A 10/24/2015   Procedure: CARDIOVERSION;  Surgeon: Pixie Casino, MD;  Location: [facility] ENDOSCOPY;  Service: Cardiovascular;  Laterality: N/A;    There were no vitals filed for this visit.      Subjective Assessment - 03/03/17 2136    Subjective The patient arrived carrying new knee brace stating "tell me I don't have to wear this."  Also, patient's daughter reports she had another fall when opening her door to let son in and falling backwards over her dog.     Patient is accompained by: Family member  daughter   Patient Stated Goals Get rid of dizziness so I can walk straight and turn my head.   Currently in Pain? Yes  general soreness from fall- states she did not need to go to doctor-- just sore.                Vestibular Assessment - 03/03/17 2137      Vestibular Assessment   General Observation Patient had a serious fall backwards over her dog earlier this week.  She noted icnreased dizziness at that time.       Sidelying Right   Sidelying Right Duration x 30+ seconds and has to come out of the position.   Sidelying Right Symptoms No nystagmus     Sidelying Left   Sidelying Left Duration x 20 seconds duration    Sidelying Left Symptoms No nystagmus                 [facility] Adult PT Treatment/Exercise - 03/03/17 0001      Ambulation/Gait   Ambulation/Gait Yes   Ambulation/Gait Assistance 5: Supervision   Ambulation/Gait Assistance Details PT assisted patient in donning L knee brace (metal upright medially/laterally).  She c/o pain over lateral aspect of knee when wearing brace as she has valgus knee position with ITBand pain and it provides a varus force to counter her  valgus.  She has more guarded gait with brace donned and needed assist to fasten straps.  Patient states she will not wear this brace.  PT discussed primary need is for RW to reduce serious falls.  We discussed that with knee exercise + improved shoewear + RW, she may not need knee brace.  I recommended she f/u with ortho MD regarding brace as it is providing counter forces to prevent further valgus deformity.    Ambulation Distance (Feet) 200 Feet   Ambulation Surface Level     Self-Care   Self-Care Other Self-Care Comments   Other Self-Care Comments  PT and patient discussed ongoing falls.  The patient has refused to use an assistive device.  Stated situations of recent falls have included dizziness, left knee buckling, and tripping over her dog.  We discussed that if she uses a RW, she would not need a knee  brace b/c she would have UE support that would reduce the risk of falling (if L knee buckles, she has UE support).  I do not think a cane would reduce her risk for falls signifiantly due to multiple contributing factors to fall risk.   This recent fall also has aggravated positional dizziness + nausea/motion sensitivity.  She is still tolerating head motion in sitting position, but notes increased symptoms since the fall.  We discussed the ongoing cycle of issues and I asked her to consider using a RW until we can get her performing regular LE strengthening, and resolve dizziness again.           Vestibular Treatment/Exercise - 03/03/17 2138      Vestibular Treatment/Exercise   Vestibular Treatment Provided Habituation   Habituation Exercises Ariel Avila;Horizontal Roll     Ariel Avila   Number of Reps  3   Symptom Description  patient experiences nausea and has to rest to allow symptoms to settle     Horizontal Roll   Number of Reps  2   Symptom Description  Mild symptoms of nausea with repetition of movements.       Seated Horizontal Head Turns   Number of Reps  3   Symptom Description  Able to perform without significant symptoms.               PT Education - 03/03/17 2145    Education provided Yes   Education Details See self care- extensive education and discussion on safety and need to reconsider RW use.   Person(s) Educated Patient;Child(ren)   Methods Explanation   Comprehension Verbalized understanding          PT Short Term Goals - 02/25/17 1257      PT SHORT TERM GOAL #1   Title The patient will be indep in HEP for habituation for horizontal head motion, slow gaze activities, and moving sit>R sidelying to improve tolerance to functional activities.   Baseline Met on 02/22/2017   Time 4   Period Weeks   Status Achieved     PT SHORT TERM GOAL #2   Title The patient will have negative R sidelying and/or R dix hallpike testing indicating resolution of R  BPPV.   Baseline Met on 02/25/2017   Time 4   Period Weeks   Status Achieved     PT SHORT TERM GOAL #3   Title The patient will tolerate seated horizontal head turns x 5 reps with no nausea noted.   Baseline Patient continues with mild dizziness, but no nausea at this time (02/22/2017)  Time 4   Period Weeks   Status Achieved     PT SHORT TERM GOAL #4   Title The patient will tolerate rolling supine<>bilateral sidelying without nausea noted and change in dizziness from baseline<2/10.   Baseline Met on 02/25/2017   Time 4   Period Weeks   Status Achieved     PT SHORT TERM GOAL #5   Title The patient will be further assessed on gait speed and balance *needed CGA to leave clinic on eval* and goals to follow, as indicated.   Baseline Gait speed=2.27 ft/sec, Berg=20/56   Time 4   Period Weeks   Status Achieved           PT Long Term Goals - 02/22/17 1458      PT LONG TERM GOAL #1   Title The patient will reduce DHI from 68% to < or equal to 45% to demo dec'ing self perception of disability from dizziness.   Baseline Target date 04/09/2017   Time 8   Period Weeks     PT LONG TERM GOAL #2   Title The patient will be indep with progression of HEP for balance, gait activities, gaze adaptation and habituation as indicated.   Baseline Target date 04/09/2017   Time 8   Period Weeks     PT LONG TERM GOAL #3   Title The patient will note no dizziness with bed mobility or seated head motion for improved tolerance to movement.   Baseline Target date 04/09/2017   Time 8   Period Weeks   Status Deferred     PT LONG TERM GOAL #4   Title Further Gait speed and berg goals to follow once assessment completed.   Baseline Target date 04/09/2017  *Gait speed=1.65 ft/sec on 2/19*   Time 8   Period Weeks   Status Achieved     PT LONG TERM GOAL #5   Title Improve gait speed from 2.27 ft/sec (on 2/23) up to > or equal to 2/5 ft/sec to demo improving functional moblity.   Baseline Target  date 04/09/2017   Time 8   Period Weeks   Status New     Additional Long Term Goals   Additional Long Term Goals Yes     PT LONG TERM GOAL #6   Title The patient will improve Berg from 20/56 up to > or equal to 26/56 to demo decreased risk for falls.   Baseline Target date 04/09/2017   Time 8   Period Weeks   Status New               Plan - 03/03/17 2147    Clinical Impression Statement The patient was making progress with physical therapy, however has suffered another serious fall.  She notes soreness in back, ribs and hip, however does not feel it needs further medical evaluation.  Our session today emphasized reassessing for vertigo, as symptoms returned after falling, discussed need to use rolling walker, and evaluating gait with new brace that patient states she cannot tolerate wearing.  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    Patient denies headache or vision changes since recent fall.    PT Treatment/Interventions ADLs/Self Care Home Management;Canalith Repostioning;Therapeutic activities;Therapeutic exercise;Balance training;Neuromuscular re-education;Gait training;Patient/family education;Vestibular   PT Next Visit Plan Reassess dizziness; add habituation to HEP; discuss RW use, countertop balance ([place] components), gait training.  Consulted and Agree with Plan of Care Patient   Family Member Consulted daughter present      Patient will benefit from skilled therapeutic intervention in order to improve the following deficits and impairments:  Abnormal gait, Decreased balance, Dizziness, Difficulty walking, Decreased mobility, Decreased strength  Visit Diagnosis: Dizziness and giddiness  Other abnormalities of gait and mobility  BPPV (benign paroxysmal positional vertigo), right     Problem List Patient Active Problem List   Diagnosis Date  Noted  . On amiodarone therapy 02/21/2016  . Acute on chronic diastolic heart failure ([place]) 10/29/2015  . Persistent atrial fibrillation ([place])   . Nausea vomiting and diarrhea 12/04/2014  . Hypoglycemia 12/03/2014  . Diabetes mellitus type 2, controlled ([place]) 12/03/2014  . CKD (chronic kidney disease) stage 3, GFR 30-59 ml/min 12/03/2014  . Hiatal hernia 12/03/2014  . GERD (gastroesophageal reflux disease) 12/03/2014  . Bronchitis, acute, with bronchospasm 06/23/2014  . Encounter for therapeutic drug monitoring 04/12/2014  . Atrial fibrillation ([place]) 11/18/2013  . Essential hypertension 11/18/2013  . Hyperlipidemia 11/18/2013  . Diabetes mellitus type 2, uncontrolled, with complications ([place]) 97/41/6384  . Hyperaldosteronism ([place]) 11/18/2013  . Chronic diastolic heart failure ([place]) 11/18/2013    Class: Chronic    Vaishnav Demartin, PT 03/03/2017, 9:50 PM  [facility] [address] [place], [place], [address] Phone: [phone]   Fax:  [phone]  Name: Ariel Avila MRN: [id number] Date of Birth: 05-13-1930

## 2017-03-04 ENCOUNTER — Ambulatory Visit: Payer: Medicare Other | Admitting: Rehabilitative and Restorative Service Providers"

## 2017-03-04 DIAGNOSIS — R2689 Other abnormalities of gait and mobility: Secondary | ICD-10-CM

## 2017-03-04 DIAGNOSIS — M25562 Pain in left knee: Secondary | ICD-10-CM | POA: Diagnosis not present

## 2017-03-04 DIAGNOSIS — R42 Dizziness and giddiness: Secondary | ICD-10-CM

## 2017-03-04 DIAGNOSIS — M6281 Muscle weakness (generalized): Secondary | ICD-10-CM | POA: Diagnosis not present

## 2017-03-04 DIAGNOSIS — H8111 Benign paroxysmal vertigo, right ear: Secondary | ICD-10-CM

## 2017-03-04 NOTE — Therapy (Signed)
[facility] [address] [place], [place], [address] Phone: [phone]   Fax:  [phone]  Physical Therapy Treatment  Patient Details  Name: Ariel Avila MRN: [id number] Date of Birth: 12-Feb-1930 Referring Provider: Vicie Mutters, MD  Encounter Date: 03/04/2017      PT End of Session - 03/04/17 1250    Visit Number 7   Number of Visits 12   Date for PT Re-Evaluation 04/09/17   Authorization Type G code every 10th visit   PT Start Time 1240   PT Stop Time 1326   PT Time Calculation (min) 46 min   Equipment Utilized During Treatment Gait belt   Activity Tolerance Patient tolerated treatment well   Behavior During Therapy [facility] for tasks assessed/performed      Past Medical History:  Diagnosis Date  . Allergy   . Arthritis    "fingers and back" (06/23/2014)  . Basal cell carcinoma   . CHF (congestive heart failure) ([place])   . Chronic bronchitis ([place])    "lately" (06/23/2014)  . Chronic kidney disease (CKD), stage III (moderate)   . Dysrhythmia   . GERD (gastroesophageal reflux disease)   . H/O hiatal hernia   . Hyperlipidemia   . Hypertension   . Osteoporosis   . Paroxysmal a-fib ([place])   . Type II diabetes mellitus ([place])     Past Surgical History:  Procedure Laterality Date  . ABDOMINAL HYSTERECTOMY    . APPENDECTOMY    . BASAL CELL CARCINOMA EXCISION     "face and nose"  . CARDIOVERSION N/A 10/24/2015   Procedure: CARDIOVERSION;  Surgeon: Pixie Casino, MD;  Location: [facility] ENDOSCOPY;  Service: Cardiovascular;  Laterality: N/A;    There were no vitals filed for this visit.      Subjective Assessment - 03/04/17 1247    Subjective The patient notes that she had a bad episode of spinning one night when she thought her husband fell and turned quickly.  She brought RW into therapy today to determine if correct size/height.  She is not able to donn new knee brace.    Patient is accompained by: Family  member  son   Patient Stated Goals Get rid of dizziness so I can walk straight and turn my head.   Currently in Pain? Yes   Pain Score 4   "real bad yesterday"   Pain Location Leg   Pain Orientation Left   Pain Descriptors / Indicators Sore;Throbbing   Pain Type Chronic pain   Pain Onset More than a month ago   Pain Frequency Intermittent   Aggravating Factors  unsure; worse when on her feet   Pain Relieving Factors sitting down      NEUROMUSCULAR RE-EDUCATION: Sidelying test:  Patient notes mild sensation of dizziness to the left side and none to the right side today.  She notes symptoms significantly improved since Friday's visit. Patient notes continued dizziness when lying supine (x years/chronic in nature).  Re-instructed in brandt daroff habituation and VOR x 1 viewing for home (see patient instructions) Ambulation x forward 10 feet and then backwards 10 feet without UE support with close supervision x 4 reps.    Gait: Ambulation with RW x 200 ft modified indep  Ambulation without device with supervision x 200 ft x 2 reps without loss of balance today (more steady than Friday's visit).    THERAPEUTIC EXERCISE: Supine PROM hamstring stretch, heelcord stretch, piriformis stretch, and IT band stretch Standing hip abduction  x 10 reps R and L Standing knee flexion x 5 reps L and 10 reps R Supine bridges x 10 reps   MANUAL: Soft tissue mobilization superiolateral aspect of knee with palpable edema (?possible bursa) as well as tightness through muscle nearing insertion.  Patient point tender with palpation.          PT Education - 03/04/17 1412    Education provided Yes   Education Details recommended patient return to home exercises as she is not as sore from recent fall; set up RW to ensure correct height   Person(s) Educated Patient   Methods Explanation;Demonstration;Handout   Comprehension Returned demonstration;Verbalized understanding          PT Short Term  Goals - 02/25/17 1257      PT SHORT TERM GOAL #1   Title The patient will be indep in HEP for habituation for horizontal head motion, slow gaze activities, and moving sit>R sidelying to improve tolerance to functional activities.   Baseline Met on 02/22/2017   Time 4   Period Weeks   Status Achieved     PT SHORT TERM GOAL #2   Title The patient will have negative R sidelying and/or R dix hallpike testing indicating resolution of R BPPV.   Baseline Met on 02/25/2017   Time 4   Period Weeks   Status Achieved     PT SHORT TERM GOAL #3   Title The patient will tolerate seated horizontal head turns x 5 reps with no nausea noted.   Baseline Patient continues with mild dizziness, but no nausea at this time (02/22/2017)   Time 4   Period Weeks   Status Achieved     PT SHORT TERM GOAL #4   Title The patient will tolerate rolling supine<>bilateral sidelying without nausea noted and change in dizziness from baseline<2/10.   Baseline Met on 02/25/2017   Time 4   Period Weeks   Status Achieved     PT SHORT TERM GOAL #5   Title The patient will be further assessed on gait speed and balance *needed CGA to leave clinic on eval* and goals to follow, as indicated.   Baseline Gait speed=2.27 ft/sec, Berg=20/56   Time 4   Period Weeks   Status Achieved           PT Long Term Goals - 02/22/17 1458      PT LONG TERM GOAL #1   Title The patient will reduce DHI from 68% to < or equal to 45% to demo dec'ing self perception of disability from dizziness.   Baseline Target date 04/09/2017   Time 8   Period Weeks     PT LONG TERM GOAL #2   Title The patient will be indep with progression of HEP for balance, gait activities, gaze adaptation and habituation as indicated.   Baseline Target date 04/09/2017   Time 8   Period Weeks     PT LONG TERM GOAL #3   Title The patient will note no dizziness with bed mobility or seated head motion for improved tolerance to movement.   Baseline Target date  04/09/2017   Time 8   Period Weeks   Status Deferred     PT LONG TERM GOAL #4   Title Further Gait speed and berg goals to follow once assessment completed.   Baseline Target date 04/09/2017  *Gait speed=1.65 ft/sec on 2/19*   Time 8   Period Weeks   Status Achieved     PT  LONG TERM GOAL #5   Title Improve gait speed from 2.27 ft/sec (on 2/23) up to > or equal to 2/5 ft/sec to demo improving functional moblity.   Baseline Target date 04/09/2017   Time 8   Period Weeks   Status New     Additional Long Term Goals   Additional Long Term Goals Yes     PT LONG TERM GOAL #6   Title The patient will improve Berg from 20/56 up to > or equal to 26/56 to demo decreased risk for falls.   Baseline Target date 04/09/2017   Time 8   Period Weeks   Status New               Plan - 03/04/17 1413    Clinical Impression Statement The patient has improved since acute pain after a fall last week and is moving better.  Her vertigo is also improved from Friday's visit.  PT still encouraging use of device, especially if vertigo present as this significantly increases her imbalance.  PT recommended return to prior HEP and addition of ice due to point tenderness over L superior/lateral knee.   PT Treatment/Interventions ADLs/Self Care Home Management;Canalith Repostioning;Therapeutic activities;Therapeutic exercise;Balance training;Neuromuscular re-education;Gait training;Patient/family education;Vestibular   PT Next Visit Plan Check HEP, standing strength and balance, gait training-- progress HEP   Consulted and Agree with Plan of Care Patient      Patient will benefit from skilled therapeutic intervention in order to improve the following deficits and impairments:  Abnormal gait, Decreased balance, Dizziness, Difficulty walking, Decreased mobility, Decreased strength  Visit Diagnosis: Dizziness and giddiness  Other abnormalities of gait and mobility  BPPV (benign paroxysmal positional  vertigo), right     Problem List Patient Active Problem List   Diagnosis Date Noted  . On amiodarone therapy 02/21/2016  . Acute on chronic diastolic heart failure ([place]) 10/29/2015  . Persistent atrial fibrillation ([place])   . Nausea vomiting and diarrhea 12/04/2014  . Hypoglycemia 12/03/2014  . Diabetes mellitus type 2, controlled ([place]) 12/03/2014  . CKD (chronic kidney disease) stage 3, GFR 30-59 ml/min 12/03/2014  . Hiatal hernia 12/03/2014  . GERD (gastroesophageal reflux disease) 12/03/2014  . Bronchitis, acute, with bronchospasm 06/23/2014  . Encounter for therapeutic drug monitoring 04/12/2014  . Atrial fibrillation ([place]) 11/18/2013  . Essential hypertension 11/18/2013  . Hyperlipidemia 11/18/2013  . Diabetes mellitus type 2, uncontrolled, with complications ([place]) 01/02/7252  . Hyperaldosteronism ([place]) 11/18/2013  . Chronic diastolic heart failure ([place]) 11/18/2013    Class: Chronic    Daniyla Pfahler, PT 03/04/2017, 2:19 PM  [facility] [address] [place], [place], [address] Phone: [phone]   Fax:  [phone]  Name: Ariel Avila MRN: [id number] Date of Birth: 1930/10/24

## 2017-03-04 NOTE — Patient Instructions (Signed)
Safety tips to think about BEFORE exercise: 1) Plan to sit for 15 minutes after finishing these activities to allow time for imbalance to settle. 2) Use rest room before doing, so that you don't have to get up right after doing exercises. 3) Have water, remote, phone nearby so you don't have to get up right after doing exercises. 4) Consider using your walker if you feel unsteady.   Habituation - Tip Card  1.The goal of habituation training is to assist in decreasing symptoms of vertigo, dizziness, or nausea provoked by specific head and body motions. 2.These exercises may initially increase symptoms; however, be persistent and work through symptoms. With repetition and time, the exercises will assist in reducing or eliminating symptoms. 3.Exercises should be stopped and discussed with the therapist if you experience any of the following: - Sudden change or fluctuation in hearing - New onset of ringing in the ears, or increase in current intensity - Any fluid discharge from the ear - Severe pain in neck or back - Extreme nausea  change or fluctuation in hearing -   Habituation - Sit to Side-Lying   Sit on edge of bed. Lie down onto the right side and hold until dizziness stops, plus 20 seconds.  Return to sitting and wait until dizziness stops, plus 20 seconds.  Repeat to the left side. Repeat sequence 5 times per session. Do 2 sessions per day.  Copyright  VHI. All rights reserved.      Gaze Stabilization: Sitting    Keeping eyes on target on wall 3 feet away, and move head side to side for _30__ seconds. Do __2-3__ sessions per day.  Copyright  VHI. All rights reserved.   Gaze Stabilization: Tip Card  1.Target must remain in focus, not blurry, and appear stationary while head is in motion. 2.Perform exercises with small head movements (45 to either side of midline). 3.Increase speed of head motion so long as target is in focus. 4.If you wear eyeglasses, be sure you can  see target through lens (therapist will give specific instructions for bifocal / progressive lenses). 5.These exercises may provoke dizziness or nausea. Work through these symptoms. If too dizzy, slow head movement slightly. Rest between each exercise. 6.Exercises demand concentration; avoid distractions.  Copyright  VHI. All rights reserved.   Special Instructions: Exercises may bring on mild to moderate symptoms of dizziness or nausea that resolve within 30 minutes of completing exercises. If symptoms are lasting longer than 30 minutes, modify your exercises by:    >decreasing the # of times you complete each activity >ensuring your symptoms return to baseline before moving onto the next exercise >dividing up exercises so you do not do them all in one session, but multiple short sessions throughout the day >doing them once a day until symptoms improve      Electronically signed by Mervyn Gay, PT at 02/18/2017 1:14 PM    Home Modalities: Ice  Methods Cold Pack: Wrap with moist towel before applying. Leave on for _20__ minutes.   Repeat 2 times/day.  Copyright  VHI. All rights reserved.

## 2017-03-08 ENCOUNTER — Ambulatory Visit: Payer: Medicare Other | Admitting: Rehabilitative and Restorative Service Providers"

## 2017-03-08 ENCOUNTER — Other Ambulatory Visit: Payer: Self-pay | Admitting: Interventional Cardiology

## 2017-03-08 DIAGNOSIS — H8111 Benign paroxysmal vertigo, right ear: Secondary | ICD-10-CM | POA: Diagnosis not present

## 2017-03-08 DIAGNOSIS — M25562 Pain in left knee: Secondary | ICD-10-CM | POA: Diagnosis not present

## 2017-03-08 DIAGNOSIS — M6281 Muscle weakness (generalized): Secondary | ICD-10-CM | POA: Diagnosis not present

## 2017-03-08 DIAGNOSIS — R2689 Other abnormalities of gait and mobility: Secondary | ICD-10-CM | POA: Diagnosis not present

## 2017-03-08 DIAGNOSIS — R42 Dizziness and giddiness: Secondary | ICD-10-CM

## 2017-03-08 NOTE — Therapy (Signed)
[facility] [address] [place], [place], [address] Phone: [phone]   Fax:  [phone]  Physical Therapy Treatment  Patient Details  Name: Ariel Avila MRN: [id number] Date of Birth: 1930-07-19 Referring Provider: Vicie Mutters, MD  Encounter Date: 03/08/2017      PT End of Session - 03/08/17 1515    Visit Number 8   Number of Visits 12   Date for PT Re-Evaluation 04/09/17   Authorization Type G code every 10th visit   PT Start Time 1405   PT Stop Time 1505   PT Time Calculation (min) 60 min   Activity Tolerance Patient tolerated treatment well   Behavior During Therapy [facility] for tasks assessed/performed      Past Medical History:  Diagnosis Date  . Allergy   . Arthritis    "fingers and back" (06/23/2014)  . Basal cell carcinoma   . CHF (congestive heart failure) ([place])   . Chronic bronchitis ([place])    "lately" (06/23/2014)  . Chronic kidney disease (CKD), stage III (moderate)   . Dysrhythmia   . GERD (gastroesophageal reflux disease)   . H/O hiatal hernia   . Hyperlipidemia   . Hypertension   . Osteoporosis   . Paroxysmal a-fib ([place])   . Type II diabetes mellitus ([place])     Past Surgical History:  Procedure Laterality Date  . ABDOMINAL HYSTERECTOMY    . APPENDECTOMY    . BASAL CELL CARCINOMA EXCISION     "face and nose"  . CARDIOVERSION N/A 10/24/2015   Procedure: CARDIOVERSION;  Surgeon: Pixie Casino, MD;  Location: [facility] ENDOSCOPY;  Service: Cardiovascular;  Laterality: N/A;    There were no vitals filed for this visit.      Subjective Assessment - 03/08/17 1404    Subjective The patient notes her wheels on her walker are not rolling right.  "I got real dizzy yesterday."    Patient is accompained by: Family member  daughter   Patient Stated Goals Get rid of dizziness so I can walk straight and turn my head.   Currently in Pain? Yes   Pain Score 5    Pain Location Knee   Pain Orientation  Left   Pain Descriptors / Indicators Aching   Pain Type Chronic pain   Pain Onset More than a month ago   Pain Frequency Intermittent   Aggravating Factors  worse with ice -- led to throbbing sensation   Pain Relieving Factors sitting down                         [facility] Adult PT Treatment/Exercise - 03/08/17 1518      Ambulation/Gait   Ambulation/Gait Yes   Ambulation/Gait Assistance 4: Min guard   Ambulation/Gait Assistance Details The patient walked into clinic without device, however reports worsening knee pain today.  Ambulated x 45 feet with PT requiring HHA due to knee "catching" sensation and increasing pain.     Ambulation Distance (Feet) 45 Feet   Ambulation Surface Level   Gait Comments Discussed safety recommending rolling walker use today due to fall risk due to knee pain and "catching" sensation.  She notes 2 of her falls have occurred on days when her knee hurts like this.       Exercises   Exercises Knee/Hip     Knee/Hip Exercises: Stretches   Passive Hamstring Stretch Right;3 reps;60 seconds   Quad Stretch 2 reps;20 seconds;Left   Quad  Stretch Limitations in right sidelying with passive quad stretch with lateral knee pain noted   Hip Flexor Stretch Left;2 reps;20 seconds   Hip Flexor Stretch Limitations in right sidelying   ITB Stretch 2 reps   ITB Stretch Limitations attempted sidelying, but painful today, performed in supine with L leg passively moved to the right within tolerable range   Gastroc Stretch Right;2 reps;30 seconds   Gastroc Stretch Limitations passive gastrocs stretch after hamstring stretching     Knee/Hip Exercises: Supine   Quad Sets 5 reps   Short Arc Quad Sets 10 reps   Bridges 10 reps   Straight Leg Raises Strengthening;Left;10 reps   Straight Leg Raises Limitations cues for quad contraction t/o range   Knee Flexion Limitations painful to all palpation over lateral aspect of L knee today.   Other Supine Knee/Hip Exercises  Hip ab/adduction in supine/hooklying x 5 reps                PT Education - 03/08/17 1513    Education provided Yes   Education Details Discontinue ice as this creates worsening pain that radiates into distal leg;   recommended moist head and f/u with MD- can do exercises within tolerable range for strengthening LEs and continue vestibular exercises   Person(s) Educated Patient   Methods Explanation;Demonstration;Handout   Comprehension Verbalized understanding;Returned demonstration          PT Short Term Goals - 02/25/17 1257      PT SHORT TERM GOAL #1   Title The patient will be indep in HEP for habituation for horizontal head motion, slow gaze activities, and moving sit>R sidelying to improve tolerance to functional activities.   Baseline Met on 02/22/2017   Time 4   Period Weeks   Status Achieved     PT SHORT TERM GOAL #2   Title The patient will have negative R sidelying and/or R dix hallpike testing indicating resolution of R BPPV.   Baseline Met on 02/25/2017   Time 4   Period Weeks   Status Achieved     PT SHORT TERM GOAL #3   Title The patient will tolerate seated horizontal head turns x 5 reps with no nausea noted.   Baseline Patient continues with mild dizziness, but no nausea at this time (02/22/2017)   Time 4   Period Weeks   Status Achieved     PT SHORT TERM GOAL #4   Title The patient will tolerate rolling supine<>bilateral sidelying without nausea noted and change in dizziness from baseline<2/10.   Baseline Met on 02/25/2017   Time 4   Period Weeks   Status Achieved     PT SHORT TERM GOAL #5   Title The patient will be further assessed on gait speed and balance *needed CGA to leave clinic on eval* and goals to follow, as indicated.   Baseline Gait speed=2.27 ft/sec, Berg=20/56   Time 4   Period Weeks   Status Achieved           PT Long Term Goals - 02/22/17 1458      PT LONG TERM GOAL #1   Title The patient will reduce DHI from 68% to <  or equal to 45% to demo dec'ing self perception of disability from dizziness.   Baseline Target date 04/09/2017   Time 8   Period Weeks     PT LONG TERM GOAL #2   Title The patient will be indep with progression of HEP for balance, gait activities,  gaze adaptation and habituation as indicated.   Baseline Target date 04/09/2017   Time 8   Period Weeks     PT LONG TERM GOAL #3   Title The patient will note no dizziness with bed mobility or seated head motion for improved tolerance to movement.   Baseline Target date 04/09/2017   Time 8   Period Weeks   Status Deferred     PT LONG TERM GOAL #4   Title Further Gait speed and berg goals to follow once assessment completed.   Baseline Target date 04/09/2017  *Gait speed=1.65 ft/sec on 2/19*   Time 8   Period Weeks   Status Achieved     PT LONG TERM GOAL #5   Title Improve gait speed from 2.27 ft/sec (on 2/23) up to > or equal to 2/5 ft/sec to demo improving functional moblity.   Baseline Target date 04/09/2017   Time 8   Period Weeks   Status New     Additional Long Term Goals   Additional Long Term Goals Yes     PT LONG TERM GOAL #6   Title The patient will improve Berg from 20/56 up to > or equal to 26/56 to demo decreased risk for falls.   Baseline Target date 04/09/2017   Time 8   Period Weeks   Status New               Plan - 03/08/17 1516    Clinical Impression Statement The patient's gait today hindered by "catching" sensation at knee, sensation that left knee could buckle, and pain that is worse today over lateral aspect of knee.  Patient has significant tenderness to palpation along entire length of L IT band, point tenderness over L greater trochanter, L lateral knee, and along ITB/quad and ITB/hamstring border.  PT educated patient to use rolling walker today as she has increased fall risk due to knee pain.  Only dizziness noted today was with left rolling to move supine>sitting.   PT Treatment/Interventions  ADLs/Self Care Home Management;Canalith Repostioning;Therapeutic activities;Therapeutic exercise;Balance training;Neuromuscular re-education;Gait training;Patient/family education;Vestibular   PT Next Visit Plan Review all HEP and consolidate handouts; LE strengthening, functional gait activities, habituation for remaining vertigo   Consulted and Agree with Plan of Care Patient      Patient will benefit from skilled therapeutic intervention in order to improve the following deficits and impairments:  Abnormal gait, Decreased balance, Dizziness, Difficulty walking, Decreased mobility, Decreased strength  Visit Diagnosis: Dizziness and giddiness  Other abnormalities of gait and mobility  BPPV (benign paroxysmal positional vertigo), right     Problem List Patient Active Problem List   Diagnosis Date Noted  . On amiodarone therapy 02/21/2016  . Acute on chronic diastolic heart failure ([place]) 10/29/2015  . Persistent atrial fibrillation ([place])   . Nausea vomiting and diarrhea 12/04/2014  . Hypoglycemia 12/03/2014  . Diabetes mellitus type 2, controlled ([place]) 12/03/2014  . CKD (chronic kidney disease) stage 3, GFR 30-59 ml/min 12/03/2014  . Hiatal hernia 12/03/2014  . GERD (gastroesophageal reflux disease) 12/03/2014  . Bronchitis, acute, with bronchospasm 06/23/2014  . Encounter for therapeutic drug monitoring 04/12/2014  . Atrial fibrillation ([place]) 11/18/2013  . Essential hypertension 11/18/2013  . Hyperlipidemia 11/18/2013  . Diabetes mellitus type 2, uncontrolled, with complications ([place]) 20/10/711  . Hyperaldosteronism ([place]) 11/18/2013  . Chronic diastolic heart failure ([place]) 11/18/2013    Class: Chronic    Jordyn Hofacker, PT 03/08/2017, 3:22 PM  [facility] [address]  [place], [place], [address] Phone: [phone]   Fax:  [phone]  Name: Ariel Avila MRN: [id number] Date of Birth: 23-Mar-1930

## 2017-03-08 NOTE — Patient Instructions (Signed)
IT Band tightness with tenderness over greater trochanteric bursa and lateral knee.  Stop Ice due to worsening tightness.     Heat Therapy Heat therapy can help ease sore, stiff, injured, and tight muscles and joints. Heat relaxes your muscles, which may help ease your pain. Heat therapy should only be used on old, pre-existing, or long-lasting (chronic) injuries. Do not use heat therapy unless told by your doctor. How to use heat therapy There are several different kinds of heat therapy, including:  Moist heat pack.  Warm water bath.  Hot water bottle.  Electric heating pad.  Heated gel pack.  Heated wrap.  Electric heating pad. General heat therapy recommendations  Do not sleep while using heat therapy. Only use heat therapy while you are awake.  Your skin may turn pink while using heat therapy. Do not use heat therapy if your skin turns red.  Do not use heat therapy if you have new pain.  High heat or long exposure to heat can cause burns. Be careful when using heat therapy to avoid burning your skin.  Do not use heat therapy on areas of your skin that are already irritated, such as with a rash or sunburn. Get help if:  You have blisters, redness, swelling (puffiness), or numbness.  You have new pain.  Your pain is worse. This information is not intended to replace advice given to you by your health care provider. Make sure you discuss any questions you have with your health care provider. Document Released: 03/10/2012 Document Revised: 05/24/2016 Document Reviewed: 02/09/2014 Elsevier Interactive Patient Education  2017 [employer].

## 2017-03-12 DIAGNOSIS — M1712 Unilateral primary osteoarthritis, left knee: Secondary | ICD-10-CM | POA: Diagnosis not present

## 2017-03-18 ENCOUNTER — Ambulatory Visit: Payer: Medicare Other | Admitting: Rehabilitative and Restorative Service Providers"

## 2017-03-18 DIAGNOSIS — R2689 Other abnormalities of gait and mobility: Secondary | ICD-10-CM

## 2017-03-18 DIAGNOSIS — R42 Dizziness and giddiness: Secondary | ICD-10-CM

## 2017-03-18 DIAGNOSIS — M25562 Pain in left knee: Secondary | ICD-10-CM

## 2017-03-18 DIAGNOSIS — H8111 Benign paroxysmal vertigo, right ear: Secondary | ICD-10-CM | POA: Diagnosis not present

## 2017-03-18 DIAGNOSIS — M6281 Muscle weakness (generalized): Secondary | ICD-10-CM

## 2017-03-18 NOTE — Therapy (Signed)
[facility] [address] [place], [place], [address] Phone: [phone]   Fax:  [phone]  Physical Therapy Treatment  Patient Details  Name: Ariel Avila MRN: [id number] Date of Birth: 02/17/1930 Referring Provider: Vicie Mutters, MD (vestibular) and Dr. Victorino December, MD (L knee)  Encounter Date: 03/18/2017      PT End of Session - 03/18/17 1517    Visit Number 9   Number of Visits 12   Date for PT Re-Evaluation 04/09/17   Authorization Type G code every 10th visit   PT Start Time 0850   PT Stop Time 0922   PT Time Calculation (min) 32 min   Activity Tolerance Patient limited by pain   Behavior During Therapy [facility] for tasks assessed/performed      Past Medical History:  Diagnosis Date  . Allergy   . Arthritis    "fingers and back" (06/23/2014)  . Basal cell carcinoma   . CHF (congestive heart failure) ([place])   . Chronic bronchitis ([place])    "lately" (06/23/2014)  . Chronic kidney disease (CKD), stage III (moderate)   . Dysrhythmia   . GERD (gastroesophageal reflux disease)   . H/O hiatal hernia   . Hyperlipidemia   . Hypertension   . Osteoporosis   . Paroxysmal a-fib ([place])   . Type II diabetes mellitus ([place])     Past Surgical History:  Procedure Laterality Date  . ABDOMINAL HYSTERECTOMY    . APPENDECTOMY    . BASAL CELL CARCINOMA EXCISION     "face and nose"  . CARDIOVERSION N/A 10/24/2015   Procedure: CARDIOVERSION;  Surgeon: Pixie Casino, MD;  Location: [facility] ENDOSCOPY;  Service: Cardiovascular;  Laterality: N/A;    There were no vitals filed for this visit.      Subjective Assessment - 03/18/17 0922    Subjective The patient saw orthopedic MD last week and obtained order for PT for knee OA with hip/quad strength, IT manipulation, stretching + modalities prn recommended.  She notes dizziness better, but not gone.    Patient Stated Goals Get rid of dizziness so I can walk straight and turn my  head.   Currently in Pain? Yes   Pain Score 5    Pain Location Knee   Pain Orientation Left   Pain Descriptors / Indicators Sore   Pain Type Chronic pain   Pain Onset More than a month ago   Pain Frequency Intermittent   Aggravating Factors  being on feet   Pain Relieving Factors sitting down (decreases, but does not stop completely- pain in lateral knee)                         [facility] Adult PT Treatment/Exercise - 03/18/17 0924      Ambulation/Gait   Ambulation/Gait Yes   Ambulation/Gait Assistance 4: Min assist   Ambulation/Gait Assistance Details The patient reports "catching" sensation in the left knee with gait activites worried the L knee could buckle.  She did not bring her assistive device (RW) today.   Ambulation Distance (Feet) 250 Feet   x 2 reps   Ambulation Surface Level   Gait Comments Patient with increased knee pain today hindering further gait activities.      Exercises   Exercises Knee/Hip     Knee/Hip Exercises: Stretches   Active Hamstring Stretch 2 reps;20 seconds   ITB Stretch 2 reps   ITB Stretch Limitations performed right sidelying for L IT  band     Modalities   Modalities Ultrasound     Ultrasound   Ultrasound Location L IT band   Ultrasound Parameters 3Mhz, 1.5 w/cm2, 20% duty cycle pulsed ultrasound x 7 minutes   Ultrasound Goals Pain     Manual Therapy   Manual Therapy Soft tissue mobilization   Manual therapy comments L IT band emphasis with patient positioned in right sidelying   Soft tissue mobilization Soft tissue mobilization with trigger point release x 12 minutes L IT band.                   PT Short Term Goals - 02/25/17 1257      PT SHORT TERM GOAL #1   Title The patient will be indep in HEP for habituation for horizontal head motion, slow gaze activities, and moving sit>R sidelying to improve tolerance to functional activities.   Baseline Met on 02/22/2017   Time 4   Period Weeks   Status Achieved      PT SHORT TERM GOAL #2   Title The patient will have negative R sidelying and/or R dix hallpike testing indicating resolution of R BPPV.   Baseline Met on 02/25/2017   Time 4   Period Weeks   Status Achieved     PT SHORT TERM GOAL #3   Title The patient will tolerate seated horizontal head turns x 5 reps with no nausea noted.   Baseline Patient continues with mild dizziness, but no nausea at this time (02/22/2017)   Time 4   Period Weeks   Status Achieved     PT SHORT TERM GOAL #4   Title The patient will tolerate rolling supine<>bilateral sidelying without nausea noted and change in dizziness from baseline<2/10.   Baseline Met on 02/25/2017   Time 4   Period Weeks   Status Achieved     PT SHORT TERM GOAL #5   Title The patient will be further assessed on gait speed and balance *needed CGA to leave clinic on eval* and goals to follow, as indicated.   Baseline Gait speed=2.27 ft/sec, Berg=20/56   Time 4   Period Weeks   Status Achieved           PT Long Term Goals - 03/18/17 1526      PT LONG TERM GOAL #1   Title The patient will reduce DHI from 68% to < or equal to 45% to demo dec'ing self perception of disability from dizziness.   Baseline Target date 04/09/2017   Time 8   Period Weeks     PT LONG TERM GOAL #2   Title The patient will be indep with progression of HEP for balance, gait activities, gaze adaptation and habituation as indicated.   Baseline Target date 04/09/2017   Time 8   Period Weeks     PT LONG TERM GOAL #3   Title The patient will note no dizziness with bed mobility or seated head motion for improved tolerance to movement.   Baseline Target date 04/09/2017   Time 8   Period Weeks   Status Deferred     PT LONG TERM GOAL #4   Title Further Gait speed and berg goals to follow once assessment completed.   Baseline Target date 04/09/2017  *Gait speed=1.65 ft/sec on 2/19*   Time 8   Period Weeks   Status Achieved     PT LONG TERM GOAL #5   Title  Improve gait speed from 2.27 ft/sec (on 2/23)  up to > or equal to 2/5 ft/sec to demo improving functional moblity.   Baseline Target date 04/09/2017   Time 8   Period Weeks   Status New     Additional Long Term Goals   Additional Long Term Goals Yes     PT LONG TERM GOAL #6   Title The patient will improve Berg from 20/56 up to > or equal to 26/56 to demo decreased risk for falls.   Baseline Target date 04/09/2017   Time 8   Period Weeks   Status New     PT LONG TERM GOAL #7   Title The patient will report knee pain < or equal to 3/10 to demo improved tolerance to functional mobility.   Baseline Target date 04/09/2017   Time 4   Period Weeks   Status New               Plan - 03/18/17 1518    Clinical Impression Statement The patient is continuing to have increasing knee pain/discomfort, which is limiting safety with gait/balance activities due to a sensation the knee could "buckle".  Therefore, PT is addressing this as part of plan.  Patient has order today from Victorino December, MD for Pt to continue 2-3x/week x 4 weeks emphasizing hip/quad strengthening for gait, IT manipulation and stretching program with modalities, prn.  PT updating current plan of care to include emphasis on knee.    PT will send new cert to ortho MD as ENT has already signed off on vestibular plan of care.    Rehab Potential Good   Clinical Impairments Affecting Rehab Potential complex clinical presentation involving multiple systems   PT Frequency 2x / week   PT Duration 8 weeks   PT Treatment/Interventions ADLs/Self Care Home Management;Canalith Repostioning;Therapeutic activities;Therapeutic exercise;Balance training;Neuromuscular re-education;Gait training;Patient/family education;Vestibular;Ultrasound;Moist Heat;Cryotherapy;Electrical Stimulation;Functional mobility training;Manual techniques   PT Next Visit Plan Provide updated HEP for knee strengthening, stretching.  Continue functional gait activities  and habituation (per vestibular plan of care).    Consulted and Agree with Plan of Care Patient      Patient will benefit from skilled therapeutic intervention in order to improve the following deficits and impairments:  Abnormal gait, Decreased balance, Dizziness, Difficulty walking, Decreased mobility, Decreased strength  Visit Diagnosis: Other abnormalities of gait and mobility  Acute pain of left knee  Muscle weakness (generalized)  Dizziness and giddiness     Problem List Patient Active Problem List   Diagnosis Date Noted  . On amiodarone therapy 02/21/2016  . Acute on chronic diastolic heart failure ([place]) 10/29/2015  . Persistent atrial fibrillation ([place])   . Nausea vomiting and diarrhea 12/04/2014  . Hypoglycemia 12/03/2014  . Diabetes mellitus type 2, controlled ([place]) 12/03/2014  . CKD (chronic kidney disease) stage 3, GFR 30-59 ml/min 12/03/2014  . Hiatal hernia 12/03/2014  . GERD (gastroesophageal reflux disease) 12/03/2014  . Bronchitis, acute, with bronchospasm 06/23/2014  . Encounter for therapeutic drug monitoring 04/12/2014  . Atrial fibrillation ([place]) 11/18/2013  . Essential hypertension 11/18/2013  . Hyperlipidemia 11/18/2013  . Diabetes mellitus type 2, uncontrolled, with complications ([place]) 40/98/1191  . Hyperaldosteronism ([place]) 11/18/2013  . Chronic diastolic heart failure ([place]) 11/18/2013    Class: Chronic    Emmanuel Ercole 03/18/2017, 3:32 PM  [facility] [address] [place], [place], [address] Phone: [phone]   Fax:  [phone]  Name: MAISHA BOGEN MRN: [id number] Date of Birth: June 27, 1930

## 2017-03-19 ENCOUNTER — Ambulatory Visit (INDEPENDENT_AMBULATORY_CARE_PROVIDER_SITE_OTHER): Payer: Medicare Other | Admitting: Pharmacist

## 2017-03-19 DIAGNOSIS — H04123 Dry eye syndrome of bilateral lacrimal glands: Secondary | ICD-10-CM | POA: Diagnosis not present

## 2017-03-19 DIAGNOSIS — H16122 Filamentary keratitis, left eye: Secondary | ICD-10-CM | POA: Diagnosis not present

## 2017-03-19 DIAGNOSIS — I4891 Unspecified atrial fibrillation: Secondary | ICD-10-CM

## 2017-03-19 DIAGNOSIS — Z5181 Encounter for therapeutic drug level monitoring: Secondary | ICD-10-CM | POA: Diagnosis not present

## 2017-03-19 DIAGNOSIS — H16223 Keratoconjunctivitis sicca, not specified as Sjogren's, bilateral: Secondary | ICD-10-CM | POA: Diagnosis not present

## 2017-03-19 DIAGNOSIS — H1820 Unspecified corneal edema: Secondary | ICD-10-CM | POA: Diagnosis not present

## 2017-03-19 DIAGNOSIS — R911 Solitary pulmonary nodule: Secondary | ICD-10-CM

## 2017-03-19 LAB — POCT INR: INR: 3.1

## 2017-03-25 ENCOUNTER — Ambulatory Visit: Payer: Medicare Other | Admitting: Rehabilitative and Restorative Service Providers"

## 2017-03-25 VITALS — BP 189/78 | HR 58

## 2017-03-25 DIAGNOSIS — M25562 Pain in left knee: Secondary | ICD-10-CM | POA: Diagnosis not present

## 2017-03-25 DIAGNOSIS — M6281 Muscle weakness (generalized): Secondary | ICD-10-CM

## 2017-03-25 DIAGNOSIS — H8111 Benign paroxysmal vertigo, right ear: Secondary | ICD-10-CM | POA: Diagnosis not present

## 2017-03-25 DIAGNOSIS — R2689 Other abnormalities of gait and mobility: Secondary | ICD-10-CM

## 2017-03-25 DIAGNOSIS — R42 Dizziness and giddiness: Secondary | ICD-10-CM

## 2017-03-25 NOTE — Therapy (Addendum)
[facility] [address] [place], [place], [address] Phone: [phone]   Fax:  [phone]  Physical Therapy Treatment  Patient Details  Name: FILOMENA POKORNEY MRN: [id number] Date of Birth: [birth year] Referring Provider: Vicie Mutters, MD  Encounter Date: 03/25/2017      PT End of Session - 03/25/17 1420    Visit Number 10   Number of Visits 20   Date for PT Re-Evaluation 04/09/17   Authorization Type G code every 10th visit   PT Start Time 0930   PT Stop Time 1016   PT Time Calculation (min) 46 min   Equipment Utilized During Treatment Gait belt   Activity Tolerance Other (comment);Treatment limited secondary to medical complications (Comment)  nausea and dizziness after lying down, elevated BP   Behavior During Therapy [facility] for tasks assessed/performed      Past Medical History:  Diagnosis Date  . Allergy   . Arthritis    "fingers and back" (06/23/2014)  . Basal cell carcinoma   . CHF (congestive heart failure) ([place])   . Chronic bronchitis ([place])    "lately" (06/23/2014)  . Chronic kidney disease (CKD), stage III (moderate)   . Dysrhythmia   . GERD (gastroesophageal reflux disease)   . H/O hiatal hernia   . Hyperlipidemia   . Hypertension   . Osteoporosis   . Paroxysmal a-fib ([place])   . Type II diabetes mellitus ([place])     Past Surgical History:  Procedure Laterality Date  . ABDOMINAL HYSTERECTOMY    . APPENDECTOMY    . BASAL CELL CARCINOMA EXCISION     "face and nose"  . CARDIOVERSION N/A 10/24/2015   Procedure: CARDIOVERSION;  Surgeon: Pixie Casino, MD;  Location: [facility] ENDOSCOPY;  Service: Cardiovascular;  Laterality: N/A;    Vitals:   03/25/17 0933  BP: (!) 189/78  Pulse: (!) 58        Subjective Assessment - 03/25/17 0933    Subjective The patient notes dizziness is still there, but she doesn't notice it much anymore.  She is able to turn to the right side.  She brought a brace to PT.  After  nausea/ dizziness began, PT checked BP, which is elevated.  Patient notes she took BP meds after 8:30 and that may account for elevated reading.  PT recommended f/u with primary MD due to hypertension.    Patient Stated Goals Get rid of dizziness so I can walk straight and turn my head.   Currently in Pain? Yes   Pain Score 4    Pain Location Knee   Pain Orientation Lower;Lateral   Pain Descriptors / Indicators Aching   Pain Type Chronic pain   Pain Onset More than a month ago   Pain Frequency Intermittent   Aggravating Factors  walking   Pain Relieving Factors resting                         [facility] Adult PT Treatment/Exercise - 03/25/17 0941      Ambulation/Gait   Ambulation/Gait Yes   Ambulation/Gait Assistance 5: Supervision   Ambulation Distance (Feet) 350 Feet   Ambulation Surface Level   Gait Comments Patient walked with don joy brace with 2 flexible metal uprights and velcro closure.  Patient has improved step length with brace, but does have intermittent foot scuffing on L side.  Encouraged emphasis on heel strike with gait.      Self-Care   Self-Care Other  Self-Care Comments   Other Self-Care Comments  Discussed HTN, patient notes 190/91  upon waking this morning.    Notes it is always high upon walking.  Recommended paitent use knee brace and practiced donning/doffing brace in therapy.      Exercises   Exercises Knee/Hip;Ankle     Knee/Hip Exercises: Stretches   Active Hamstring Stretch 2 reps;20 seconds   Active Hamstring Stretch Limitations sitting    Passive Hamstring Stretch Left;2 reps;30 seconds   ITB Stretch 2 reps   ITB Stretch Limitations across body stretch   Gastroc Stretch Right;Left;2 reps;30 seconds   Gastroc Stretch Limitations standing with UE support     Knee/Hip Exercises: Standing   Other Standing Knee Exercises Began backwards walking, however patient had to rest due to nausea.      Knee/Hip Exercises: Seated   Long Arc Quad 10  reps;Left     Knee/Hip Exercises: Supine   Quad Sets 5 reps   Bridges 10 reps   Bridges Limitations After being in supine position after 8 minutes (estimated), patient gets significant nausea and dizziness.  Sat up and let symptoms settle.  Then began to attempt standing exercises and patient's nausea worsened.     Straight Leg Raises Strengthening;Left;10 reps   Straight Leg Raises Limitations cues for quad contraction t/o range                PT Education - 03/25/17 1420    Education provided Yes   Education Details Consolidated HEP to include gaze activities, as well as knee/LE strengthening.  Educated on how to donn knee brace   Person(s) Educated Patient   Methods Explanation;Demonstration;Handout   Comprehension Verbalized understanding;Returned demonstration          PT Short Term Goals - 02/25/17 1257      PT SHORT TERM GOAL #1   Title The patient will be indep in HEP for habituation for horizontal head motion, slow gaze activities, and moving sit>R sidelying to improve tolerance to functional activities.   Baseline Met on 02/22/2017   Time 4   Period Weeks   Status Achieved     PT SHORT TERM GOAL #2   Title The patient will have negative R sidelying and/or R dix hallpike testing indicating resolution of R BPPV.   Baseline Met on 02/25/2017   Time 4   Period Weeks   Status Achieved     PT SHORT TERM GOAL #3   Title The patient will tolerate seated horizontal head turns x 5 reps with no nausea noted.   Baseline Patient continues with mild dizziness, but no nausea at this time (02/22/2017)   Time 4   Period Weeks   Status Achieved     PT SHORT TERM GOAL #4   Title The patient will tolerate rolling supine<>bilateral sidelying without nausea noted and change in dizziness from baseline<2/10.   Baseline Met on 02/25/2017   Time 4   Period Weeks   Status Achieved     PT SHORT TERM GOAL #5   Title The patient will be further assessed on gait speed and balance  *needed CGA to leave clinic on eval* and goals to follow, as indicated.   Baseline Gait speed=2.27 ft/sec, Berg=20/56   Time 4   Period Weeks   Status Achieved           PT Long Term Goals - 03/18/17 1526      PT LONG TERM GOAL #1   Title The patient will  reduce DHI from 68% to < or equal to 45% to demo dec'ing self perception of disability from dizziness.   Baseline Target date 04/09/2017   Time 8   Period Weeks     PT LONG TERM GOAL #2   Title The patient will be indep with progression of HEP for balance, gait activities, gaze adaptation and habituation as indicated.   Baseline Target date 04/09/2017   Time 8   Period Weeks     PT LONG TERM GOAL #3   Title The patient will note no dizziness with bed mobility or seated head motion for improved tolerance to movement.   Baseline Target date 04/09/2017   Time 8   Period Weeks   Status Deferred     PT LONG TERM GOAL #4   Title Further Gait speed and berg goals to follow once assessment completed.   Baseline Target date 04/09/2017  *Gait speed=1.65 ft/sec on 2/19*   Time 8   Period Weeks   Status Achieved     PT LONG TERM GOAL #5   Title Improve gait speed from 2.27 ft/sec (on 2/23) up to > or equal to 2/5 ft/sec to demo improving functional moblity.   Baseline Target date 04/09/2017   Time 8   Period Weeks   Status New     Additional Long Term Goals   Additional Long Term Goals Yes     PT LONG TERM GOAL #6   Title The patient will improve Berg from 20/56 up to > or equal to 26/56 to demo decreased risk for falls.   Baseline Target date 04/09/2017   Time 8   Period Weeks   Status New     PT LONG TERM GOAL #7   Title The patient will report knee pain < or equal to 3/10 to demo improved tolerance to functional mobility.   Baseline Target date 04/09/2017   Time 4   Period Weeks   Status New               Plan - 03/25/17 1423    Clinical Impression Statement The patient c/o dizziness with lying supine x  minutes and sat up.  We continued with further knee strengthening and stretching activities, however with standing tasks, patient became more nauseous.  She also noted a headache.  PT monitored BP and it was elevated.  Patient notes her BP reading was typical for her and that when it is this high, she is supposed to take an additional BP medication.  PT recommended f/u with MD as needed due to hypertension.   PT Treatment/Interventions ADLs/Self Care Home Management;Canalith Repostioning;Therapeutic activities;Therapeutic exercise;Balance training;Neuromuscular re-education;Gait training;Patient/family education;Vestibular;Ultrasound;Moist Heat;Cryotherapy;Electrical Stimulation;Functional mobility training;Manual techniques   PT Next Visit Plan L LE gluteal strengthening, quad/hamstring strengthening.  Continue gait and habituation as needed.  CHECK BLOOD PRESSURE*   Consulted and Agree with Plan of Care Patient      Patient will benefit from skilled therapeutic intervention in order to improve the following deficits and impairments:  Abnormal gait, Decreased balance, Dizziness, Difficulty walking, Decreased mobility, Decreased strength  Visit Diagnosis: Other abnormalities of gait and mobility  Acute pain of left knee  Muscle weakness (generalized)  Dizziness and giddiness  Physical Therapy Progress Note  Dates of Reporting Period: 02/08/17 to 03/25/17  Objective Reports of Subjective Statement: patient notes dizziness significantly improved.  She still c/o dizziness/nausea when lying supine (no head motion x minutes).  Objective Measurements:  L knee pain with intermittent sensation of  knee buckling, able to perform head motion seated without dizziness.   Goal Update: see above  Plan: see above  Reason Skilled Services are Required: addressing gait instability due to multi-factorial imbalance, as well as L knee pain/weakness.     Problem List Patient Active Problem List    Diagnosis Date Noted  . On amiodarone therapy 02/21/2016  . Acute on chronic diastolic heart failure ([place]) 10/29/2015  . Persistent atrial fibrillation ([place])   . Nausea vomiting and diarrhea 12/04/2014  . Hypoglycemia 12/03/2014  . Diabetes mellitus type 2, controlled ([place]) 12/03/2014  . CKD (chronic kidney disease) stage 3, GFR 30-59 ml/min 12/03/2014  . Hiatal hernia 12/03/2014  . GERD (gastroesophageal reflux disease) 12/03/2014  . Bronchitis, acute, with bronchospasm 06/23/2014  . Encounter for therapeutic drug monitoring 04/12/2014  . Atrial fibrillation ([place]) 11/18/2013  . Essential hypertension 11/18/2013  . Hyperlipidemia 11/18/2013  . Diabetes mellitus type 2, uncontrolled, with complications ([place]) 75/61/9718  . Hyperaldosteronism ([place]) 11/18/2013  . Chronic diastolic heart failure ([place]) 11/18/2013    Class: Chronic    Jorell Agne, PT 03/25/2017, 2:27 PM  [facility] [address] [place], [place], [address] Phone: [phone]   Fax:  [phone]  Name: KESHAYLA SCHRUM MRN: [id number] Date of Birth: 1930/06/07

## 2017-03-25 NOTE — Patient Instructions (Signed)
Gaze Stabilization: Sitting    Keeping eyes on target on wall 3 feet away, and move head side to side for _30__ seconds. Do __2-3__ sessions per day.  Copyright  VHI. All rights reserved.   Straight Leg Raise    Bend one leg. Raise other leg _8__ inches with knee locked. Exhale and tighten thigh muscles while raising leg. Repeat with other leg. Repeat _10___ times. Do _2___ sessions per day.  http://[email or web address].exer.[email or web address]   Copyright  VHI. All rights reserved.   IT Band: Knee Across Body    Lie on back, shoulders on surface. Stretch right knee up and across body. Hold _20__ seconds. Relax. Repeat _3__ times. Do _2__ times a day. Repeat with other leg.    Copyright  VHI. All rights reserved.   Chair Sitting    Sit at edge of seat, spine straight, one leg extended. Put a hand on each thigh and bend forward from the hip, keeping spine straight.  Hold __20_ seconds. Repeat _3__ times per session. Do __2_ sessions per day.  Copyright  VHI. All rights reserved.  Bridge    Lie back, legs bent. Inhale, pressing hips up. Keeping ribs in, lengthen lower back. Exhale, rolling down along spine from top. Repeat _10___ times. Do __2__ sessions per day.  http://pm.exer.us/55   Copyright  VHI. All rights reserved.

## 2017-04-04 ENCOUNTER — Ambulatory Visit: Payer: Medicare Other | Admitting: Rehabilitative and Restorative Service Providers"

## 2017-04-04 ENCOUNTER — Telehealth: Payer: Self-pay | Admitting: Rehabilitative and Restorative Service Providers"

## 2017-04-04 NOTE — Telephone Encounter (Signed)
The patient is having a knee procedure tomorrow and called to cancel visit tomorrow.  PT recommended she call to schedule once she undergoes procedure and MD lets her know activity restrictions.   Chenille Toor, PT

## 2017-04-05 ENCOUNTER — Other Ambulatory Visit: Payer: Self-pay | Admitting: Interventional Cardiology

## 2017-04-05 ENCOUNTER — Ambulatory Visit: Payer: Medicare Other | Admitting: Rehabilitative and Restorative Service Providers"

## 2017-04-05 DIAGNOSIS — M1712 Unilateral primary osteoarthritis, left knee: Secondary | ICD-10-CM | POA: Diagnosis not present

## 2017-04-10 ENCOUNTER — Ambulatory Visit: Payer: Medicare Other | Admitting: Rehabilitative and Restorative Service Providers"

## 2017-04-11 ENCOUNTER — Ambulatory Visit: Payer: Medicare Other | Attending: Otolaryngology | Admitting: Rehabilitative and Restorative Service Providers"

## 2017-04-11 VITALS — BP 152/76

## 2017-04-11 DIAGNOSIS — M6281 Muscle weakness (generalized): Secondary | ICD-10-CM | POA: Diagnosis not present

## 2017-04-11 DIAGNOSIS — M25562 Pain in left knee: Secondary | ICD-10-CM | POA: Insufficient documentation

## 2017-04-11 DIAGNOSIS — R2689 Other abnormalities of gait and mobility: Secondary | ICD-10-CM | POA: Insufficient documentation

## 2017-04-11 NOTE — Patient Instructions (Signed)
[place], Supine    Lie on back, feet together. Lower knees toward floor. Hold _20-30__ seconds. Repeat __3_ times per session. Do __3_ sessions per day.  Copyright  VHI. All rights reserved.

## 2017-04-11 NOTE — Therapy (Signed)
[facility] [address] [place], [place], [address] Phone: [phone]   Fax:  [phone]  Physical Therapy Treatment  Patient Details  Name: Ariel Avila MRN: [id number] Date of Birth: 1930-07-03 Referring Provider: Vicie Mutters, MD  Encounter Date: 04/11/2017      PT End of Session - 04/11/17 1556    Visit Number 11   Number of Visits 20   Date for PT Re-Evaluation 04/09/17   Authorization Type G code every 10th visit   PT Start Time 1110   PT Stop Time 1150   PT Time Calculation (min) 40 min   Equipment Utilized During Treatment Gait belt   Activity Tolerance Other (comment);Treatment limited secondary to medical complications (Comment)  nausea and dizziness after lying down, elevated BP   Behavior During Therapy [facility] for tasks assessed/performed      Past Medical History:  Diagnosis Date  . Allergy   . Arthritis    "fingers and back" (06/23/2014)  . Basal cell carcinoma   . CHF (congestive heart failure) ([place])   . Chronic bronchitis ([place])    "lately" (06/23/2014)  . Chronic kidney disease (CKD), stage III (moderate)   . Dysrhythmia   . GERD (gastroesophageal reflux disease)   . H/O hiatal hernia   . Hyperlipidemia   . Hypertension   . Osteoporosis   . Paroxysmal a-fib ([place])   . Type II diabetes mellitus ([place])     Past Surgical History:  Procedure Laterality Date  . ABDOMINAL HYSTERECTOMY    . APPENDECTOMY    . BASAL CELL CARCINOMA EXCISION     "face and nose"  . CARDIOVERSION N/A 10/24/2015   Procedure: CARDIOVERSION;  Surgeon: Pixie Casino, MD;  Location: [facility] ENDOSCOPY;  Service: Cardiovascular;  Laterality: N/A;    Vitals:   04/11/17 1113  BP: (!) 152/76        Subjective Assessment - 04/11/17 1113    Subjective The patient continues with intermittent knee pain (lateral) that is worse today--she woke up with worse pain this morning.  She feels that injection last week helped.  She  reports she has been doing better and she had gone 3 days without needing brace.  She is using ice and voltaren gel to manage pain in knee.  She reports no further knee buckling.   Patient notes blood pressure was 206/101 this morning.  She reports it runs this high and her MD has her taking her BP to monitor.  She had a severe headache this morning that has resolved.     Patient is accompained by: Family member  husband waits in lobby, daughter drove her/ waits outside.   Patient Stated Goals Get rid of dizziness so I can walk straight and turn my head.   Currently in Pain? Yes   Pain Score 2   "just a little"   Pain Location Knee   Pain Orientation Left;Lateral   Pain Descriptors / Indicators Aching;Sore   Pain Type Chronic pain   Pain Onset More than a month ago   Pain Frequency Intermittent   Aggravating Factors  varies   Pain Relieving Factors brace, ice, voltaren                         [facility] Adult PT Treatment/Exercise - 04/11/17 2229      Ambulation/Gait   Ambulation/Gait Yes   Ambulation/Gait Assistance 7: Independent   Ambulation/Gait Assistance Details patient wears L knee brace  for support   Ambulation Distance (Feet) 200 Feet   Ambulation Surface Level   Gait velocity 3.07 ft/sec    Gait Comments Patient's mobility has improved in quality and speed of movement without pain during ambulation today.  She c/o imbalance when attempting to negotiate unlevel surfaces.       Neuro Re-ed    Neuro Re-ed Details  Performed wall bumps standing for hip strategy and balance strategies> progressed to eyes closed with wall bumps with supervision.  Standing head motion in corner for balance activities on solid surfaces.  Compliant surface standing with eyes closed x 30 seconds with close supervision.       Exercises   Exercises Knee/Hip;Ankle     Knee/Hip Exercises: Stretches   Passive Hamstring Stretch Left;2 reps;20 seconds   Passive Hamstring Stretch Limitations  supine position   ITB Stretch 2 reps   ITB Stretch Limitations across body, supine   Gastroc Stretch Right;Left;2 reps;30 seconds   Gastroc Stretch Limitations standing with UE support   Other Knee/Hip Stretches Hip adductor stretch supine "butterfly" stretch, passive stretching left hip adductors with contract/relax performed.      Knee/Hip Exercises: Supine   Bridges 10 reps   Straight Leg Raises Strengthening;Left;10 reps   Straight Leg Raises Limitations Patient demonstrates improved movement as compared to prior sessions.  Sheis eliciting strong quad contraction prior to raising LE.   Straight Leg Raise with External Rotation Strengthening;10 reps   Straight Leg Raise with External Rotation Limitations Left side emphasizing VMO strengthening     Knee/Hip Exercises: Sidelying   Hip ABduction Strengthening;10 reps   Clams x 10 reps for L LE     Manual Therapy   Manual Therapy Soft tissue mobilization   Manual therapy comments L hip adductor and L medial hamstring.  Patient noted pain during soft tissue work, however pain did not last during walking/standing balance activities.    Soft tissue mobilization Soft tissue mobilization with trigger point release x 10 minutes                 PT Education - 04/11/17 2226    Education provided Yes   Education Details HEP: added butterfly stretch for hip adductor tightness   Person(s) Educated Patient   Methods Explanation;Demonstration;Handout   Comprehension Verbalized understanding;Returned demonstration          PT Short Term Goals - 02/25/17 1257      PT SHORT TERM GOAL #1   Title The patient will be indep in HEP for habituation for horizontal head motion, slow gaze activities, and moving sit>R sidelying to improve tolerance to functional activities.   Baseline Met on 02/22/2017   Time 4   Period Weeks   Status Achieved     PT SHORT TERM GOAL #2   Title The patient will have negative R sidelying and/or R dix hallpike  testing indicating resolution of R BPPV.   Baseline Met on 02/25/2017   Time 4   Period Weeks   Status Achieved     PT SHORT TERM GOAL #3   Title The patient will tolerate seated horizontal head turns x 5 reps with no nausea noted.   Baseline Patient continues with mild dizziness, but no nausea at this time (02/22/2017)   Time 4   Period Weeks   Status Achieved     PT SHORT TERM GOAL #4   Title The patient will tolerate rolling supine<>bilateral sidelying without nausea noted and change in dizziness from baseline<2/10.  Baseline Met on 02/25/2017   Time 4   Period Weeks   Status Achieved     PT SHORT TERM GOAL #5   Title The patient will be further assessed on gait speed and balance *needed CGA to leave clinic on eval* and goals to follow, as indicated.   Baseline Gait speed=2.27 ft/sec, Berg=20/56   Time 4   Period Weeks   Status Achieved           PT Long Term Goals - 04/11/17 1154      PT LONG TERM GOAL #1   Title The patient will reduce DHI from 68% to < or equal to 45% to demo dec'ing self perception of disability from dizziness.   Baseline *have not retested due to continuing to treat for knee issues that lead to imbalance at this time.   Time 8   Period Weeks   Status Deferred     PT LONG TERM GOAL #2   Title The patient will be indep with progression of HEP for balance, gait activities, gaze adaptation and habituation as indicated.   Baseline Dizziness has significantly improved-- PT focused more on knee strength/pain mgmt to improve safety and mobility of ambulation.   Time 8   Period Weeks   Status Achieved     PT LONG TERM GOAL #3   Title The patient will note no dizziness with bed mobility or seated head motion for improved tolerance to movement.   Baseline Patient notes dizziness is not her main limiting factor for mobility.   Time 8   Period Weeks   Status Achieved     PT LONG TERM GOAL #4   Title Further Gait speed and berg goals to follow once  assessment completed.   Baseline Target date 04/09/2017  *Gait speed=1.65 ft/sec on 2/19*   Time 8   Period Weeks   Status Achieved     PT LONG TERM GOAL #5   Title Improve gait speed from 2.27 ft/sec (on 2/23) up to > or equal to 2/5 ft/sec to demo improving functional moblity.   Baseline Improved to 3.07 ft/sec today.    Time 8   Period Weeks   Status Achieved     PT LONG TERM GOAL #6   Title The patient will improve Berg from 20/56 up to > or equal to 26/56 to demo decreased risk for falls.   Baseline Plan to continue to new LTGs-- not assessed today as sessions focusing on knee recently.   Time 8   Period Weeks   Status Revised     PT LONG TERM GOAL #7   Title The patient will report knee pain < or equal to 3/10 to demo improved tolerance to functional mobility.   Baseline Describes "mild" pain rating 2/10.   Time 4   Period Weeks   Status Achieved      UPDATED GOALS:     PT Long Term Goals - 04/11/17 2243      PT LONG TERM GOAL #1   Title The patient will return demo HEP progression for LE strength, balance and dizziness as indicated.   Baseline Target date 05/10/17   Time 4   Period Weeks   Status New     PT LONG TERM GOAL #2   Title The patient will demonstrate stepping up onto 6" curb in order to improve ability to take dogs out and access community surfaces.   Baseline Target date 05/10/17   Time 4  Period Weeks   Status New     PT LONG TERM GOAL #3   Title The patient will be further assessed on steps and will return demo step to pattern in order to reduce pressure on left knee.   Baseline Target date 05/10/17   Time 4   Period Weeks   Status New     PT LONG TERM GOAL #4   Title The patient will improve Berg balance score to > or equal to 26/56.   Baseline Target date 05/10/17   Time 4   Period Weeks   Status New     PT LONG TERM GOAL #5   Title The patient will be further assessed on grassy surfaces and will verbalize recommendations for safety to  reduce falls as she takes dogs out in yard.   Baseline Target date 05/10/17   Time 4   Period Weeks   Status New              Plan - 04/11/17 1150    Clinical Impression Statement The patient continues to note significant improvement with dizziness.  Over the past month, sessions have focused on strengthening the L knee as she was c/o L knee buckling and leading to falls.  Patient provided MD order for ortho MD to address and this was assessed under her plan of care.    The patient is showing improvements with the L knee including:  reduced pain, improved gait speed, improved quality of movement during exercise and patient noting some days without pain.  She is also undergoing medical mgmt of knee pain with injections.  PT and patient discussed continuing PT for up to 4 more weeks (if needed) to emphasize safety on community surfaces and negotiaton of steps, grassy surfaces, inclines.  See updated LTGs.    Rehab Potential Good   Clinical Impairments Affecting Rehab Potential complex clinical presentation involving multiple systems   PT Frequency 2x / week   PT Duration 4 weeks   PT Treatment/Interventions ADLs/Self Care Home Management;Canalith Repostioning;Therapeutic activities;Therapeutic exercise;Balance training;Neuromuscular re-education;Gait training;Patient/family education;Vestibular;Ultrasound;Moist Heat;Cryotherapy;Electrical Stimulation;Functional mobility training;Manual techniques   PT Next Visit Plan work on step ups (no longer walks dog in typical location due to having step up to get to path); struggles with unlevel surface negotation when outside with dog;  assess Berg; community ambulation; knee strengthening and balance.   Consulted and Agree with Plan of Care Patient      Patient will benefit from skilled therapeutic intervention in order to improve the following deficits and impairments:  Abnormal gait, Decreased balance, Dizziness, Difficulty walking, Decreased  mobility, Decreased strength, Pain, Impaired flexibility  Visit Diagnosis: Other abnormalities of gait and mobility  Acute pain of left knee  Muscle weakness (generalized)     Problem List Patient Active Problem List   Diagnosis Date Noted  . On amiodarone therapy 02/21/2016  . Acute on chronic diastolic heart failure ([place]) 10/29/2015  . Persistent atrial fibrillation ([place])   . Nausea vomiting and diarrhea 12/04/2014  . Hypoglycemia 12/03/2014  . Diabetes mellitus type 2, controlled ([place]) 12/03/2014  . CKD (chronic kidney disease) stage 3, GFR 30-59 ml/min 12/03/2014  . Hiatal hernia 12/03/2014  . GERD (gastroesophageal reflux disease) 12/03/2014  . Bronchitis, acute, with bronchospasm 06/23/2014  . Encounter for therapeutic drug monitoring 04/12/2014  . Atrial fibrillation ([place]) 11/18/2013  . Essential hypertension 11/18/2013  . Hyperlipidemia 11/18/2013  . Diabetes mellitus type 2, uncontrolled, with complications ([place]) 17/61/6073  . Hyperaldosteronism ([place]) 11/18/2013  .  Chronic diastolic heart failure ([place]) 11/18/2013    Class: Chronic    Chitara Clonch, PT 04/11/2017, 10:41 PM  [facility] [address] [place], [place], [address] Phone: [phone]   Fax:  [phone]  Name: Ariel Avila MRN: [id number] Date of Birth: 07-Mar-1930

## 2017-04-12 ENCOUNTER — Encounter: Payer: Medicare Other | Admitting: Rehabilitative and Restorative Service Providers"

## 2017-04-12 DIAGNOSIS — M1712 Unilateral primary osteoarthritis, left knee: Secondary | ICD-10-CM | POA: Diagnosis not present

## 2017-04-15 ENCOUNTER — Ambulatory Visit (INDEPENDENT_AMBULATORY_CARE_PROVIDER_SITE_OTHER): Payer: Medicare Other | Admitting: Pharmacist Clinician (PhC)/ Clinical Pharmacy Specialist

## 2017-04-15 ENCOUNTER — Ambulatory Visit: Payer: Medicare Other | Admitting: Physical Therapy

## 2017-04-15 DIAGNOSIS — I4891 Unspecified atrial fibrillation: Secondary | ICD-10-CM | POA: Diagnosis not present

## 2017-04-15 DIAGNOSIS — Z5181 Encounter for therapeutic drug level monitoring: Secondary | ICD-10-CM

## 2017-04-15 LAB — POCT INR: INR: 2.2

## 2017-04-18 ENCOUNTER — Ambulatory Visit: Payer: Medicare Other | Admitting: Rehabilitative and Restorative Service Providers"

## 2017-04-19 DIAGNOSIS — M1712 Unilateral primary osteoarthritis, left knee: Secondary | ICD-10-CM | POA: Diagnosis not present

## 2017-04-23 ENCOUNTER — Ambulatory Visit: Payer: Medicare Other | Admitting: Rehabilitative and Restorative Service Providers"

## 2017-04-23 DIAGNOSIS — R2689 Other abnormalities of gait and mobility: Secondary | ICD-10-CM

## 2017-04-23 DIAGNOSIS — M25562 Pain in left knee: Secondary | ICD-10-CM

## 2017-04-23 DIAGNOSIS — M6281 Muscle weakness (generalized): Secondary | ICD-10-CM | POA: Diagnosis not present

## 2017-04-23 NOTE — Patient Instructions (Signed)
Heel Cord Stretch    Place one leg forward, bent, other leg behind and straight. Lean forward keeping back heel flat. Hold __30__ seconds while counting out loud. Repeat with other leg. Repeat __2__ times. Do _2___ sessions per day.  http://gt2.exer.us/512   Copyright  VHI. All rights reserved.   ANKLE: Circles    Move feet in a circle clockwise, then counterclockwise. __10_ reps per set, _1__ sets per day, __2_ days per week   Copyright  VHI. All rights reserved.   ANKLE: Dorsiflexion (Band)    Sit at edge of surface. Place band around top of foot. Keeping heel on floor, raise toes of banded foot. Hold _3__ seconds. Use ___red_ band. _10__ reps per set, __2_ sets per day.  Copyright  VHI. All rights reserved.

## 2017-04-23 NOTE — Therapy (Signed)
[facility] [address] [place], [place], [address] Phone: [phone]   Fax:  [phone]  Physical Therapy Treatment  Patient Details  Name: Ariel Avila MRN: [id number] Date of Birth: September 18, 1930 Referring Provider: Vicie Mutters, MD  Encounter Date: 04/23/2017      PT End of Session - 04/23/17 1013    Visit Number 12   Number of Visits 20   Date for PT Re-Evaluation 05/10/17   Authorization Type G code every 10th visit   PT Start Time 0935   PT Stop Time 1016   PT Time Calculation (min) 41 min   Equipment Utilized During Treatment Gait belt   Activity Tolerance Other (comment);Treatment limited secondary to medical complications (Comment)  nausea and dizziness after lying down, elevated BP   Behavior During Therapy [facility] for tasks assessed/performed      Past Medical History:  Diagnosis Date  . Allergy   . Arthritis    "fingers and back" (06/23/2014)  . Basal cell carcinoma   . CHF (congestive heart failure) ([place])   . Chronic bronchitis ([place])    "lately" (06/23/2014)  . Chronic kidney disease (CKD), stage III (moderate)   . Dysrhythmia   . GERD (gastroesophageal reflux disease)   . H/O hiatal hernia   . Hyperlipidemia   . Hypertension   . Osteoporosis   . Paroxysmal a-fib ([place])   . Type II diabetes mellitus ([place])     Past Surgical History:  Procedure Laterality Date  . ABDOMINAL HYSTERECTOMY    . APPENDECTOMY    . BASAL CELL CARCINOMA EXCISION     "face and nose"  . CARDIOVERSION N/A 10/24/2015   Procedure: CARDIOVERSION;  Surgeon: Pixie Casino, MD;  Location: [facility] ENDOSCOPY;  Service: Cardiovascular;  Laterality: N/A;    There were no vitals filed for this visit.      Subjective Assessment - 04/23/17 0937    Subjective The patient reports that she is not noticing significant dizziness.  Her mobility has improved (Friday was her last knee injection), however she still has L distal knee pain.   She reports she gets intermittent headaches and knows this is related to her hypertension.   The patient wears her sleeve on her knee at home and occasionally wears the orthopedic knee brace when out of the house.  Her pain is no longer in the knee, but distal to knee in proximal anterior tibialis musculature.    Patient Stated Goals Get rid of dizziness so I can walk straight and turn my head.   Currently in Pain? Yes   Pain Score 4    Pain Location Leg   Pain Orientation Left;Distal  distal to knee in anterior tibialis muscle   Pain Descriptors / Indicators Sore   Pain Type Acute pain   Pain Onset 1 to 4 weeks ago   Pain Frequency Intermittent   Aggravating Factors  unsure   Pain Relieving Factors unsure            [facility] PT Assessment - 04/23/17 1928      Palpation   Palpation comment Point tender to palpation over left anterior tibialis musculature.                       [facility] Adult PT Treatment/Exercise - 04/23/17 1928      Ambulation/Gait   Ambulation/Gait Yes   Ambulation/Gait Assistance 7: Independent   Ambulation/Gait Assistance Details patient had one episode of L foot  catching during session and needed min A to recover   Ambulation Distance (Feet) 200 Feet   Ambulation Surface Level   Gait Comments Patient notes sorenss in anterior tibialis with walking--recommended new shoewear fo rimproved support.      Exercises   Exercises Knee/Hip;Ankle     Manual Therapy   Manual Therapy Soft tissue mobilization   Manual therapy comments L anterior tibialis   Soft tissue mobilization soft tissue mobilization L anterior tibialis near insertion at tibia with tenderness noted x 8 minutes.      Ankle Exercises: Stretches   Gastroc Stretch 3 reps;30 seconds     Ankle Exercises: Seated   Other Seated Ankle Exercises Seated ankle dorsiflexion  with red theraband.     Ankle Exercises: Supine   Other Supine Ankle Exercises Ankle circles; ankle alphabet is  painful--began with circles for home.                 PT Education - 04/23/17 1016    Education provided Yes   Education Details HEP: ankle circles, heel cord stretch, ankle dorsiflexion   Person(s) Educated Patient   Methods Explanation;Demonstration;Handout   Comprehension Verbalized understanding;Returned demonstration          PT Short Term Goals - 02/25/17 1257      PT SHORT TERM GOAL #1   Title The patient will be indep in HEP for habituation for horizontal head motion, slow gaze activities, and moving sit>R sidelying to improve tolerance to functional activities.   Baseline Met on 02/22/2017   Time 4   Period Weeks   Status Achieved     PT SHORT TERM GOAL #2   Title The patient will have negative R sidelying and/or R dix hallpike testing indicating resolution of R BPPV.   Baseline Met on 02/25/2017   Time 4   Period Weeks   Status Achieved     PT SHORT TERM GOAL #3   Title The patient will tolerate seated horizontal head turns x 5 reps with no nausea noted.   Baseline Patient continues with mild dizziness, but no nausea at this time (02/22/2017)   Time 4   Period Weeks   Status Achieved     PT SHORT TERM GOAL #4   Title The patient will tolerate rolling supine<>bilateral sidelying without nausea noted and change in dizziness from baseline<2/10.   Baseline Met on 02/25/2017   Time 4   Period Weeks   Status Achieved     PT SHORT TERM GOAL #5   Title The patient will be further assessed on gait speed and balance *needed CGA to leave clinic on eval* and goals to follow, as indicated.   Baseline Gait speed=2.27 ft/sec, Berg=20/56   Time 4   Period Weeks   Status Achieved           PT Long Term Goals - 04/23/17 1014      PT LONG TERM GOAL #1   Title The patient will return demo HEP progression for LE strength, balance and dizziness as indicated.   Baseline Target date 05/10/17   Time 4   Period Weeks   Status On-going     PT LONG TERM GOAL #2    Title The patient will demonstrate stepping up onto 6" curb in order to improve ability to take dogs out and access community surfaces.   Baseline Target date 05/10/17   Time 4   Period Weeks   Status On-going     PT LONG  TERM GOAL #3   Title The patient will be further assessed on steps and will return demo step to pattern in order to reduce pressure on left knee.   Baseline Met on 04/23/17   Time 4   Period Weeks   Status Achieved     PT LONG TERM GOAL #4   Title The patient will improve Berg balance score to > or equal to 26/56.   Baseline Target date 05/10/17   Time 4   Period Weeks   Status On-going     PT LONG TERM GOAL #5   Title The patient will be further assessed on grassy surfaces and will verbalize recommendations for safety to reduce falls as she takes dogs out in yard.   Baseline Target date 05/10/17   Time 4   Period Weeks   Status On-going               Plan - 04/23/17 1925    Clinical Impression Statement The patient's pain has shifted from knee (recently underwent series of 3 injections) to distal pain.  Assessed to ensure she was not experiencing pain from L knee brace (velcro strap is donned distal to the knee), however she has point tenderness to palpation over anterior tibialis.  She does have decreased foot clearance intermittently on the left LE during gait and is walking more as knee has improved.  This may be leading to shin splint type symptoms--PT recommended she add ankle exercises to plan and change shoewear as sketchers are not supportive and have poor cushioning.    PT Treatment/Interventions ADLs/Self Care Home Management;Canalith Repostioning;Therapeutic activities;Therapeutic exercise;Balance training;Neuromuscular re-education;Gait training;Patient/family education;Vestibular;Ultrasound;Moist Heat;Cryotherapy;Electrical Stimulation;Functional mobility training;Manual techniques   PT Next Visit Plan Check LTGs, determine if able to d/c.     Consulted and Agree with Plan of Care Patient      Patient will benefit from skilled therapeutic intervention in order to improve the following deficits and impairments:  Abnormal gait, Decreased balance, Dizziness, Difficulty walking, Decreased mobility, Decreased strength, Pain, Impaired flexibility  Visit Diagnosis: Other abnormalities of gait and mobility  Muscle weakness (generalized)  Acute pain of left knee     Problem List Patient Active Problem List   Diagnosis Date Noted  . On amiodarone therapy 02/21/2016  . Acute on chronic diastolic heart failure ([place]) 10/29/2015  . Persistent atrial fibrillation ([place])   . Nausea vomiting and diarrhea 12/04/2014  . Hypoglycemia 12/03/2014  . Diabetes mellitus type 2, controlled ([place]) 12/03/2014  . CKD (chronic kidney disease) stage 3, GFR 30-59 ml/min 12/03/2014  . Hiatal hernia 12/03/2014  . GERD (gastroesophageal reflux disease) 12/03/2014  . Bronchitis, acute, with bronchospasm 06/23/2014  . Encounter for therapeutic drug monitoring 04/12/2014  . Atrial fibrillation ([place]) 11/18/2013  . Essential hypertension 11/18/2013  . Hyperlipidemia 11/18/2013  . Diabetes mellitus type 2, uncontrolled, with complications ([place]) 99/77/4142  . Hyperaldosteronism ([place]) 11/18/2013  . Chronic diastolic heart failure ([place]) 11/18/2013    Class: Chronic    Jarad Barth, PT 04/23/2017, 7:33 PM  [facility] [address] [place], [place], [address] Phone: [phone]   Fax:  [phone]  Name: ALVERDA NAZZARO MRN: [id number] Date of Birth: 1930-05-29

## 2017-04-24 ENCOUNTER — Ambulatory Visit: Payer: Medicare Other | Admitting: Rehabilitative and Restorative Service Providers"

## 2017-04-25 ENCOUNTER — Other Ambulatory Visit: Payer: Self-pay | Admitting: Interventional Cardiology

## 2017-04-25 ENCOUNTER — Ambulatory Visit: Payer: Medicare Other | Admitting: Rehabilitative and Restorative Service Providers"

## 2017-04-25 DIAGNOSIS — R1110 Vomiting, unspecified: Secondary | ICD-10-CM | POA: Diagnosis not present

## 2017-04-25 DIAGNOSIS — R748 Abnormal levels of other serum enzymes: Secondary | ICD-10-CM | POA: Diagnosis not present

## 2017-04-25 DIAGNOSIS — Z794 Long term (current) use of insulin: Secondary | ICD-10-CM | POA: Diagnosis not present

## 2017-04-25 DIAGNOSIS — N189 Chronic kidney disease, unspecified: Secondary | ICD-10-CM | POA: Diagnosis not present

## 2017-04-25 DIAGNOSIS — E785 Hyperlipidemia, unspecified: Secondary | ICD-10-CM | POA: Diagnosis not present

## 2017-04-25 DIAGNOSIS — Z7901 Long term (current) use of anticoagulants: Secondary | ICD-10-CM | POA: Diagnosis not present

## 2017-04-25 DIAGNOSIS — I10 Essential (primary) hypertension: Secondary | ICD-10-CM | POA: Diagnosis not present

## 2017-04-25 DIAGNOSIS — E1142 Type 2 diabetes mellitus with diabetic polyneuropathy: Secondary | ICD-10-CM | POA: Diagnosis not present

## 2017-05-01 ENCOUNTER — Other Ambulatory Visit: Payer: Self-pay | Admitting: Interventional Cardiology

## 2017-05-03 ENCOUNTER — Ambulatory Visit: Payer: Medicare Other | Attending: Otolaryngology | Admitting: Rehabilitative and Restorative Service Providers"

## 2017-05-03 DIAGNOSIS — R2689 Other abnormalities of gait and mobility: Secondary | ICD-10-CM | POA: Diagnosis not present

## 2017-05-03 DIAGNOSIS — M6281 Muscle weakness (generalized): Secondary | ICD-10-CM | POA: Diagnosis not present

## 2017-05-04 NOTE — Therapy (Signed)
[facility] [address] [place], [place], [address] Phone: [phone]   Fax:  [phone]  Physical Therapy Treatment  Patient Details  Name: Ariel Avila MRN: [id number] Date of Birth: [birth year] Referring Provider: Vicie Mutters, MD  Encounter Date: 05/03/2017      PT End of Session - 05/03/17 1702    Visit Number 13   Number of Visits 20   Date for PT Re-Evaluation 05/10/17   Authorization Type G code every 10th visit   PT Start Time 1317   PT Stop Time 1400   PT Time Calculation (min) 43 min   Equipment Utilized During Treatment Gait belt   Activity Tolerance Other (comment);Treatment limited secondary to medical complications (Comment)  nausea and dizziness after lying down, elevated BP   Behavior During Therapy [facility] for tasks assessed/performed      Past Medical History:  Diagnosis Date  . Allergy   . Arthritis    "fingers and back" (06/23/2014)  . Basal cell carcinoma   . CHF (congestive heart failure) ([place])   . Chronic bronchitis ([place])    "lately" (06/23/2014)  . Chronic kidney disease (CKD), stage III (moderate)   . Dysrhythmia   . GERD (gastroesophageal reflux disease)   . H/O hiatal hernia   . Hyperlipidemia   . Hypertension   . Osteoporosis   . Paroxysmal a-fib ([place])   . Type II diabetes mellitus ([place])     Past Surgical History:  Procedure Laterality Date  . ABDOMINAL HYSTERECTOMY    . APPENDECTOMY    . BASAL CELL CARCINOMA EXCISION     "face and nose"  . CARDIOVERSION N/A 10/24/2015   Procedure: CARDIOVERSION;  Surgeon: Pixie Casino, MD;  Location: [facility] ENDOSCOPY;  Service: Cardiovascular;  Laterality: N/A;    There were no vitals filed for this visit.      Subjective Assessment - 05/03/17 1233    Subjective The patient had a fall yesterday.  She noted that she got dizzy when hair dresser put her in the shampoo bowl, and when she left, she noted dizziness and fell.  Today she is  sore in her L hip and t/o her back.     Patient is accompained by: Family member  [name]   Patient Stated Goals Get rid of dizziness so I can walk straight and turn my head.   Currently in Pain? Yes   Pain Location Leg   Pain Orientation Left;Distal  hurts in lateral distal leg to knee   Pain Descriptors / Indicators Sore   Pain Type Acute pain                   Gait: Ambulation x 230 feet with CGA due to L foot dec'd clearance with one episode of foot drag.  PT encourages patient to wear different shoes for support (wearing sandals)      [facility] Adult PT Treatment/Exercise - 05/04/17 0641      Exercises   Exercises Ankle     Knee/Hip Exercises: Stretches   ITB Stretch 1 rep;Left;30 seconds     Manual Therapy   Manual Therapy Soft tissue mobilization   Manual therapy comments L anterior tibialis; L IT band   Soft tissue mobilization soft tissue mobilization L anterior tibialis and L ITB for trigger point release     Ankle Exercises: Seated   Ankle Circles/Pumps AROM;Right;Left;10 reps         Vestibular Treatment/Exercise - 05/04/17 1884  Vestibular Treatment/Exercise   Vestibular Treatment Provided Habituation;Gaze   Habituation Exercises Brandt Daroff;Seated Horizontal Head Turns   Gaze Exercises X1 Viewing Horizontal     Nestor Lewandowsky   Number of Reps  3   Symptom Description  reviewed from prior HEP from 03/04/17 --recommended patient return to exercise      Seated Horizontal Head Turns   Number of Reps  5   Symptom Description  patient notes dizziness x seconds after stopping horizontal head motion.     X1 Viewing Horizontal   Foot Position seated   Comments x 20 seconds emphasizing gaze fixation on target               PT Education - 05/03/17 1701    Education provided Yes   Education Details REcommended return to prior vestibular exercises from 03/04/17 - printed for patient   Person(s) Educated Patient   Methods  Explanation;Demonstration;Handout   Comprehension Verbalized understanding;Returned demonstration          PT Short Term Goals - 02/25/17 1257      PT SHORT TERM GOAL #1   Title The patient will be indep in HEP for habituation for horizontal head motion, slow gaze activities, and moving sit>R sidelying to improve tolerance to functional activities.   Baseline Met on 02/22/2017   Time 4   Period Weeks   Status Achieved     PT SHORT TERM GOAL #2   Title The patient will have negative R sidelying and/or R dix hallpike testing indicating resolution of R BPPV.   Baseline Met on 02/25/2017   Time 4   Period Weeks   Status Achieved     PT SHORT TERM GOAL #3   Title The patient will tolerate seated horizontal head turns x 5 reps with no nausea noted.   Baseline Patient continues with mild dizziness, but no nausea at this time (02/22/2017)   Time 4   Period Weeks   Status Achieved     PT SHORT TERM GOAL #4   Title The patient will tolerate rolling supine<>bilateral sidelying without nausea noted and change in dizziness from baseline<2/10.   Baseline Met on 02/25/2017   Time 4   Period Weeks   Status Achieved     PT SHORT TERM GOAL #5   Title The patient will be further assessed on gait speed and balance *needed CGA to leave clinic on eval* and goals to follow, as indicated.   Baseline Gait speed=2.27 ft/sec, Berg=20/56   Time 4   Period Weeks   Status Achieved           PT Long Term Goals - 04/23/17 1014      PT LONG TERM GOAL #1   Title The patient will return demo HEP progression for LE strength, balance and dizziness as indicated.   Baseline Target date 05/10/17   Time 4   Period Weeks   Status On-going     PT LONG TERM GOAL #2   Title The patient will demonstrate stepping up onto 6" curb in order to improve ability to take dogs out and access community surfaces.   Baseline Target date 05/10/17   Time 4   Period Weeks   Status On-going     PT LONG TERM GOAL #3    Title The patient will be further assessed on steps and will return demo step to pattern in order to reduce pressure on left knee.   Baseline Met on 04/23/17   Time 4  Period Weeks   Status Achieved     PT LONG TERM GOAL #4   Title The patient will improve Berg balance score to > or equal to 26/56.   Baseline Target date 05/10/17   Time 4   Period Weeks   Status On-going     PT LONG TERM GOAL #5   Title The patient will be further assessed on grassy surfaces and will verbalize recommendations for safety to reduce falls as she takes dogs out in yard.   Baseline Target date 05/10/17   Time 4   Period Weeks   Status On-going               Plan - 05/03/17 1711    Clinical Impression Statement The patient's knee was improving, however recent fall led to worsening symptoms in IT band.  She is continuing with pain in anterior tibialist region possibly b/c during use of knee brace, she does not have as much knee flexion and is having to work anterior tibialis musculature harder in order to clear foot.  She had one episode of L foot catching/creating a loss of balance in PT today--she requires min A to recover.  Patient also has sensation of vertigo today and PT recommended she return to prior exercises.   PT Treatment/Interventions ADLs/Self Care Home Management;Canalith Repostioning;Therapeutic activities;Therapeutic exercise;Balance training;Neuromuscular re-education;Gait training;Patient/family education;Vestibular;Ultrasound;Moist Heat;Cryotherapy;Electrical Stimulation;Functional mobility training;Manual techniques   PT Next Visit Plan Check LTGs, discuss progression post d/c.  Will need cert for last session.   Consulted and Agree with Plan of Care Patient   Family Member Consulted daughter present      Patient will benefit from skilled therapeutic intervention in order to improve the following deficits and impairments:  Abnormal gait, Decreased balance, Dizziness, Difficulty  walking, Decreased mobility, Decreased strength, Pain, Impaired flexibility  Visit Diagnosis: Other abnormalities of gait and mobility  Muscle weakness (generalized)     Problem List Patient Active Problem List   Diagnosis Date Noted  . On amiodarone therapy 02/21/2016  . Acute on chronic diastolic heart failure ([place]) 10/29/2015  . Persistent atrial fibrillation ([place])   . Nausea vomiting and diarrhea 12/04/2014  . Hypoglycemia 12/03/2014  . Diabetes mellitus type 2, controlled ([place]) 12/03/2014  . CKD (chronic kidney disease) stage 3, GFR 30-59 ml/min 12/03/2014  . Hiatal hernia 12/03/2014  . GERD (gastroesophageal reflux disease) 12/03/2014  . Bronchitis, acute, with bronchospasm 06/23/2014  . Encounter for therapeutic drug monitoring 04/12/2014  . Atrial fibrillation ([place]) 11/18/2013  . Essential hypertension 11/18/2013  . Hyperlipidemia 11/18/2013  . Diabetes mellitus type 2, uncontrolled, with complications ([place]) 93/10/2161  . Hyperaldosteronism ([place]) 11/18/2013  . Chronic diastolic heart failure ([place]) 11/18/2013    Class: Chronic    Diamond Martucci, PT 05/04/2017, 6:44 AM  [facility] [address] [place], [place], [address] Phone: [phone]   Fax:  [phone]  Name: Ariel Avila MRN: [id number] Date of Birth: July 23, 1930

## 2017-05-05 ENCOUNTER — Encounter (HOSPITAL_COMMUNITY): Payer: Self-pay

## 2017-05-05 ENCOUNTER — Emergency Department (HOSPITAL_COMMUNITY)
Admission: EM | Admit: 2017-05-05 | Discharge: 2017-05-05 | Disposition: A | Discharge status: Home / Self Care | Payer: Medicare Other | Attending: Emergency Medicine | Admitting: Emergency Medicine

## 2017-05-05 ENCOUNTER — Emergency Department (HOSPITAL_COMMUNITY): Payer: Medicare Other

## 2017-05-05 DIAGNOSIS — I5032 Chronic diastolic (congestive) heart failure: Secondary | ICD-10-CM | POA: Diagnosis not present

## 2017-05-05 DIAGNOSIS — Z85828 Personal history of other malignant neoplasm of skin: Secondary | ICD-10-CM | POA: Insufficient documentation

## 2017-05-05 DIAGNOSIS — S3992XA Unspecified injury of lower back, initial encounter: Secondary | ICD-10-CM | POA: Diagnosis present

## 2017-05-05 DIAGNOSIS — E1122 Type 2 diabetes mellitus with diabetic chronic kidney disease: Secondary | ICD-10-CM | POA: Diagnosis not present

## 2017-05-05 DIAGNOSIS — Y92480 Sidewalk as the place of occurrence of the external cause: Secondary | ICD-10-CM | POA: Diagnosis not present

## 2017-05-05 DIAGNOSIS — I130 Hypertensive heart and chronic kidney disease with heart failure and stage 1 through stage 4 chronic kidney disease, or unspecified chronic kidney disease: Secondary | ICD-10-CM | POA: Diagnosis not present

## 2017-05-05 DIAGNOSIS — Z7901 Long term (current) use of anticoagulants: Secondary | ICD-10-CM | POA: Diagnosis not present

## 2017-05-05 DIAGNOSIS — Z7984 Long term (current) use of oral hypoglycemic drugs: Secondary | ICD-10-CM | POA: Diagnosis not present

## 2017-05-05 DIAGNOSIS — Y9301 Activity, walking, marching and hiking: Secondary | ICD-10-CM | POA: Diagnosis not present

## 2017-05-05 DIAGNOSIS — N390 Urinary tract infection, site not specified: Secondary | ICD-10-CM | POA: Insufficient documentation

## 2017-05-05 DIAGNOSIS — W1839XA Other fall on same level, initial encounter: Secondary | ICD-10-CM | POA: Insufficient documentation

## 2017-05-05 DIAGNOSIS — S79912A Unspecified injury of left hip, initial encounter: Secondary | ICD-10-CM | POA: Diagnosis not present

## 2017-05-05 DIAGNOSIS — N183 Chronic kidney disease, stage 3 (moderate): Secondary | ICD-10-CM | POA: Insufficient documentation

## 2017-05-05 DIAGNOSIS — Y999 Unspecified external cause status: Secondary | ICD-10-CM | POA: Diagnosis not present

## 2017-05-05 DIAGNOSIS — Z79899 Other long term (current) drug therapy: Secondary | ICD-10-CM | POA: Diagnosis not present

## 2017-05-05 DIAGNOSIS — S300XXA Contusion of lower back and pelvis, initial encounter: Secondary | ICD-10-CM

## 2017-05-05 DIAGNOSIS — M25552 Pain in left hip: Secondary | ICD-10-CM | POA: Diagnosis not present

## 2017-05-05 LAB — COMPREHENSIVE METABOLIC PANEL
ALT: 39 U/L (ref 14–54)
ANION GAP: 9 (ref 5–15)
AST: 53 U/L — AB (ref 15–41)
Albumin: 2.8 g/dL — ABNORMAL LOW (ref 3.5–5.0)
Alkaline Phosphatase: 108 U/L (ref 38–126)
BUN: 24 mg/dL — AB (ref 6–20)
CHLORIDE: 106 mmol/L (ref 101–111)
CO2: 24 mmol/L (ref 22–32)
Calcium: 8.5 mg/dL — ABNORMAL LOW (ref 8.9–10.3)
Creatinine, Ser: 1.98 mg/dL — ABNORMAL HIGH (ref 0.44–1.00)
GFR calc Af Amer: 25 mL/min — ABNORMAL LOW (ref >60)
GFR, EST NON AFRICAN AMERICAN: 22 mL/min — AB (ref >60)
Glucose, Bld: 189 mg/dL — ABNORMAL HIGH (ref 65–99)
POTASSIUM: 3.7 mmol/L (ref 3.5–5.1)
Sodium: 139 mmol/L (ref 135–145)
Total Bilirubin: 0.7 mg/dL (ref 0.3–1.2)
Total Protein: 5.4 g/dL — ABNORMAL LOW (ref 6.5–8.1)

## 2017-05-05 LAB — URINALYSIS, ROUTINE W REFLEX MICROSCOPIC
Bilirubin Urine: NEGATIVE
GLUCOSE, UA: 50 mg/dL — AB
HGB URINE DIPSTICK: NEGATIVE
Ketones, ur: NEGATIVE mg/dL
NITRITE: NEGATIVE
PH: 5 (ref 5.0–8.0)
Protein, ur: 100 mg/dL — AB
SPECIFIC GRAVITY, URINE: 1.013 (ref 1.005–1.030)

## 2017-05-05 LAB — CBC WITH DIFFERENTIAL/PLATELET
BASOS ABS: 0 10*3/uL (ref 0.0–0.1)
BASOS PCT: 0 %
EOS ABS: 0.1 10*3/uL (ref 0.0–0.7)
Eosinophils Relative: 2 %
HCT: 34 % — ABNORMAL LOW (ref 36.0–46.0)
Hemoglobin: 11.5 g/dL — ABNORMAL LOW (ref 12.0–15.0)
Lymphocytes Relative: 20 %
Lymphs Abs: 1.2 10*3/uL (ref 0.7–4.0)
MCH: 29.4 pg (ref 26.0–34.0)
MCHC: 33.8 g/dL (ref 30.0–36.0)
MCV: 87 fL (ref 78.0–100.0)
MONOS PCT: 7 %
Monocytes Absolute: 0.4 10*3/uL (ref 0.1–1.0)
NEUTROS ABS: 4.1 10*3/uL (ref 1.7–7.7)
NEUTROS PCT: 71 %
PLATELETS: 178 10*3/uL (ref 150–400)
RBC: 3.91 MIL/uL (ref 3.87–5.11)
RDW: 15.1 % (ref 11.5–15.5)
WBC: 5.8 10*3/uL (ref 4.0–10.5)

## 2017-05-05 LAB — PROTIME-INR
INR: 3.15
PROTHROMBIN TIME: 33 s — AB (ref 11.4–15.2)

## 2017-05-05 MED ORDER — SODIUM CHLORIDE 0.9 % IV BOLUS (SEPSIS)
500.0000 mL | Freq: Once | INTRAVENOUS | Status: AC
  Administered 2017-05-05: 500 mL via INTRAVENOUS

## 2017-05-05 MED ORDER — DEXTROSE 5 % IV SOLN
1.0000 g | Freq: Once | INTRAVENOUS | Status: AC
  Administered 2017-05-05: 1 g via INTRAVENOUS
  Filled 2017-05-05: qty 10

## 2017-05-05 MED ORDER — CEPHALEXIN 500 MG PO CAPS: 500.0000 mg | ORAL_CAPSULE | Freq: Three times a day (TID) | ORAL | 0 refills | Status: DC

## 2017-05-05 NOTE — ED Notes (Signed)
Pt aware that urine sample is needed, but is unable to provide one at this time 

## 2017-05-05 NOTE — ED Notes (Signed)
Patient ambulated steady gait using a wheelchair for a walker.

## 2017-05-05 NOTE — ED Notes (Signed)
Pt transported to xray 

## 2017-05-05 NOTE — ED Triage Notes (Signed)
Pt c/o left pain from a fall that occurred on Thursday of last week. Pt was walking down sidewalk and got dizzy and then fell. Pt denies hitting her head or LOC. Per pt, pt has hx of vertigo and a.fib. Pt takes coumadin. Pt a&ox4.

## 2017-05-05 NOTE — ED Provider Notes (Signed)
[facility] DEPT Provider Note   CSN: [id number] Arrival date & time: 05/05/17  0930     History   Chief Complaint Chief Complaint  Patient presents with  . Fall    HPI Ariel Avila is a 83 y.o. female.  HPI The patient states she has a history of vertigo. She was at the salon and walked out. States she got "swimmy headed" and fell onto her left buttock. Does not believe she hit her head. No loss of consciousness. She denies any headache. She denies current dizziness. No focal weakness or numbness. She's been ambulatory since the fall. She's had progressive pain to the left buttock area over the past few days. States that she felt a "knot". Pain is worse with weightbearing. Patient has a history of atrial fibrillation and is on Coumadin. Last INR was 2.5 Past Medical History:  Diagnosis Date  . Allergy   . Arthritis    "fingers and back" (06/23/2014)  . Basal cell carcinoma   . CHF (congestive heart failure) ([place])   . Chronic bronchitis ([place])    "lately" (06/23/2014)  . Chronic kidney disease (CKD), stage III (moderate)   . Dysrhythmia   . GERD (gastroesophageal reflux disease)   . H/O hiatal hernia   . Hyperlipidemia   . Hypertension   . Osteoporosis   . Paroxysmal A-fib ([place])   . Type II diabetes mellitus [facility])     Patient Active Problem List   Diagnosis Date Noted  . On amiodarone therapy 02/21/2016  . Acute on chronic diastolic heart failure ([place]) 10/29/2015  . Persistent atrial fibrillation ([place])   . Nausea vomiting and diarrhea 12/04/2014  . Hypoglycemia 12/03/2014  . Diabetes mellitus type 2, controlled ([place]) 12/03/2014  . CKD (chronic kidney disease) stage 3, GFR 30-59 ml/min 12/03/2014  . Hiatal hernia 12/03/2014  . GERD (gastroesophageal reflux disease) 12/03/2014  . Bronchitis, acute, with bronchospasm 06/23/2014  . Encounter for therapeutic drug monitoring 04/12/2014  . Atrial fibrillation ([place]) 11/18/2013  . Essential hypertension 11/18/2013  .  Hyperlipidemia 11/18/2013  . Diabetes mellitus type 2, uncontrolled, with complications ([place]) 06/25/9484  . Hyperaldosteronism ([place]) 11/18/2013  . Chronic diastolic heart failure ([place]) 11/18/2013    Class: Chronic    Past Surgical History:  Procedure Laterality Date  . ABDOMINAL HYSTERECTOMY    . APPENDECTOMY    . BASAL CELL CARCINOMA EXCISION     "face and nose"  . CARDIOVERSION N/A 10/24/2015   Procedure: CARDIOVERSION;  Surgeon: Pixie Casino, MD;  Location: [facility] ENDOSCOPY;  Service: Cardiovascular;  Laterality: N/A;    OB History    No data available       Home Medications    Prior to Admission medications   Medication Sig Start Date End Date Taking? Authorizing Provider  acetaminophen (TYLENOL) 500 MG tablet Take 500 mg by mouth every 6 (six) hours as needed for moderate pain.   Yes [provider]  amiodarone (PACERONE) 200 MG tablet TAKE ONE TABLET BY MOUTH ONCE DAILY 04/25/17  Yes Belva Crome, MD  cloNIDine (CATAPRES) 0.2 MG tablet Take 1 tablet (0.2 mg total) by mouth 2 (two) times daily. Patient taking differently: Take 0.2-0.3 mg by mouth 2 (two) times daily. 0.3 mg in the morning & 0.2 mg at bedtime 11/01/15  Yes Reyne Dumas, MD  desloratadine (CLARINEX) 5 MG tablet Take 5 mg by mouth daily.   Yes [provider]  diltiazem (CARTIA XT) 180 MG 24 hr capsule Take 1 capsule (180  mg total) by mouth daily. 04/25/17  Yes Belva Crome, MD  ferrous sulfate 325 (65 FE) MG tablet Take 325 mg by mouth 2 (two) times daily.    Yes [provider]  fluticasone (FLONASE) 50 MCG/ACT nasal spray Place 1 spray into both nostrils daily as needed for allergies or rhinitis.    Yes [provider]  furosemide (LASIX) 20 MG tablet Take 40-80 mg by mouth every other day. Alternates between 40 and 80 mg every other day.   Yes [provider]  glimepiride (AMARYL) 1 MG tablet Take 1 tablet (1 mg total) by mouth daily with breakfast. 12/06/14  Yes  Barton Dubois, MD  hydrALAZINE (APRESOLINE) 50 MG tablet Take 50 mg by mouth 3 (three) times daily.    Yes [provider]  labetalol (NORMODYNE) 300 MG tablet Take 300 mg by mouth 2 (two) times daily.   Yes [provider]  lovastatin (MEVACOR) 40 MG tablet Take 1 tablet (40 mg total) by mouth at bedtime. 01/05/14  Yes Belva Crome, MD  Multiple Vitamin (MULTIVITAMIN WITH MINERALS) TABS tablet Take 1 tablet by mouth daily. One a Day Women's   Yes [provider]  Multiple Vitamins-Minerals (EYE VITAMINS PO) Take 1 capsule by mouth daily.   Yes [provider]  Multiple Vitamins-Minerals (HAIR/SKIN/NAILS/BIOTIN) TABS Take 1 tablet by mouth daily.   Yes [provider]  pantoprazole (PROTONIX) 40 MG tablet Take 40 mg by mouth daily.   Yes [provider]  Polyvinyl Alcohol-Povidone (REFRESH OP) Place 1 drop into both eyes 2 (two) times daily as needed (for dry eyes).   Yes [provider]  potassium chloride (K-DUR) 10 MEQ tablet Take 0.5 tablets (5 mEq total) by mouth daily. Patient taking differently: Take 10 mEq by mouth daily.  02/02/16  Yes Ranga, Simbiso, MD  vitamin A 8000 UNIT capsule Take 8,000 Units by mouth daily.   Yes [provider]  warfarin (COUMADIN) 2 MG tablet Take 1-2 tablets by mouth daily as directed by coumadin clinic Patient taking differently: Take 2-4 mg by mouth daily at 6 PM. 4 mg everyday except on Sunday and Thursday patient takes 2 mg. 05/01/17  Yes Belva Crome, MD  cephALEXin (KEFLEX) 500 MG capsule Take 1 capsule (500 mg total) by mouth 3 (three) times daily. 05/05/17   Julianne Rice, MD    Family History Family History  Problem Relation Age of Onset  . Tuberculosis Mother   . Heart disease Father     bad valve  . Heart attack Father   . Heart disease Brother     died at 71  . Heart attack Paternal Grandmother   . Heart attack Brother   . Hypertension Neg Hx   . Stroke Neg Hx      Social History Social History  Substance Use Topics  . Smoking status: Never Smoker  . Smokeless tobacco: Never Used  . Alcohol use No     Allergies   Levaquin [levofloxacin] and Sulfa antibiotics   Review of Systems Review of Systems  Constitutional: Negative for chills and fever.  HENT: Negative for facial swelling.   Eyes: Negative for visual disturbance.  Respiratory: Negative for shortness of breath.   Cardiovascular: Negative for chest pain and leg swelling.  Gastrointestinal: Negative for abdominal pain, nausea and vomiting.  Musculoskeletal: Positive for arthralgias and myalgias. Negative for back pain and neck pain.  Skin: Positive for wound. Negative for rash.  Neurological: Positive for  dizziness and light-headedness. Negative for syncope, weakness, numbness and headaches.  All other systems reviewed and are negative.    Physical Exam Updated Vital Signs BP (!) 175/66   Pulse (!) 58   Temp 98.6 F (37 C) (Oral)   Resp 17   Ht 5\' 4"  (1.626 m)   Wt 140 lb (63.5 kg)   SpO2 97%   BMI 24.03 kg/m   Physical Exam  Constitutional: She is oriented to person, place, and time. She appears well-developed and well-nourished. No distress.  HENT:  Head: Normocephalic and atraumatic.  Mouth/Throat: Oropharynx is clear and moist.  No obvious head injury present.  Eyes: EOM are normal. Pupils are equal, round, and reactive to light.  Injected sclera bilaterally. No appreciated nystagmus  Neck: Normal range of motion. Neck supple.  No posterior midline cervical tenderness to palpation.  Cardiovascular: Normal rate and regular rhythm.  Exam reveals no gallop and no friction rub.   No murmur heard. Pulmonary/Chest: Effort normal and breath sounds normal. No respiratory distress. She has no wheezes. She has no rales. She exhibits no tenderness.  Abdominal: Soft. Bowel sounds are normal. There is no tenderness. There is no rebound and no guarding.  Musculoskeletal:  Normal range of motion. She exhibits tenderness. She exhibits no edema.  Patient has palpable mass to the left buttock. Slight bluish discoloration. Tender to palpation. She has full range of motion of bilateral hips and pelvis is stable. No thoracic or lumbar tenderness to palpation. Distal pulses are 2+. No lower extremity swelling or asymmetry or tenderness.  Neurological: She is alert and oriented to person, place, and time.  5/5 motor in all extremities. Sensation intact. Patient ambulates without assistance.  Skin: Skin is warm and dry. Capillary refill takes less than 2 seconds. No rash noted. No erythema.  Psychiatric: She has a normal mood and affect. Her behavior is normal.  Nursing note and vitals reviewed.    ED Treatments / Results  Labs (all labs ordered are listed, but only abnormal results are displayed) Labs Reviewed  URINE CULTURE - Abnormal; Notable for the following:       Result Value   Culture >=100,000 COLONIES/mL ESCHERICHIA COLI (*)    Organism ID, Bacteria ESCHERICHIA COLI (*)    All other components within normal limits  CBC WITH DIFFERENTIAL/PLATELET - Abnormal; Notable for the following:    Hemoglobin 11.5 (*)    HCT 34.0 (*)    All other components within normal limits  COMPREHENSIVE METABOLIC PANEL - Abnormal; Notable for the following:    Glucose, Bld 189 (*)    BUN 24 (*)    Creatinine, Ser 1.98 (*)    Calcium 8.5 (*)    Total Protein 5.4 (*)    Albumin 2.8 (*)    AST 53 (*)    GFR calc non Af Amer 22 (*)    GFR calc Af Amer 25 (*)    All other components within normal limits  PROTIME-INR - Abnormal; Notable for the following:    Prothrombin Time 33.0 (*)    All other components within normal limits  URINALYSIS, ROUTINE W REFLEX MICROSCOPIC - Abnormal; Notable for the following:    APPearance CLOUDY (*)    Glucose, UA 50 (*)    Protein, ur 100 (*)    Leukocytes, UA LARGE (*)    Bacteria, UA FEW (*)    Squamous Epithelial / LPF 0-5 (*)     All other components within normal limits  EKG  EKG Interpretation  Date/Time:  Sunday May 05 2017 09:50:51 EDT Ventricular Rate:  57 PR Interval:    QRS Duration: 102 QT Interval:  531 QTC Calculation: 518 R Axis:   -42 Text Interpretation:  Sinus rhythm Left ventricular hypertrophy Inferior infarct, old Anterior infarct, old Prolonged QT interval Baseline wander in lead(s) V1 V2 Confirmed by Lita Mains  MD, Chrisy Hillebrand ([id number]) on 05/05/2017 10:30:56 AM       Radiology No results found.  Procedures Procedures (including critical care time)  Medications Ordered in ED Medications  sodium chloride 0.9 % bolus 500 mL (0 mLs Intravenous Stopped 05/05/17 1340)  cefTRIAXone (ROCEPHIN) 1 g in dextrose 5 % 50 mL IVPB (0 g Intravenous Stopped 05/05/17 1505)     Initial Impression / Assessment and Plan / ED Course  I have reviewed the triage vital signs and the nursing notes.  Pertinent labs & imaging results that were available during my care of the patient were reviewed by me and considered in my medical decision making (see chart for details).     No evidence of femur fracture. Patient is ambulatory using a wheelchair as walker. Appears to have urinary tract infection. Given IV antibiotics. Pain likely due to underlying hematoma. Advised holding Coumadin for 2 days and follow-up with her primary physician. Return precautions have been given.  Final Clinical Impressions(s) / ED Diagnoses   Final diagnoses:  Traumatic hematoma of buttock, initial encounter  Urinary tract infection without hematuria, site unspecified    New Prescriptions Discharge Medication List as of 05/05/2017  3:22 PM    START taking these medications   Details  cephALEXin (KEFLEX) 500 MG capsule Take 1 capsule (500 mg total) by mouth 3 (three) times daily., Starting Sun 05/05/2017, Print         Julianne Rice, MD 05/08/17 [phone]

## 2017-05-05 NOTE — Discharge Instructions (Signed)
Hold Coumadin for 2 days. Take antibiotic as prescribed. Continue with ice to the left hip as needed. Return immediately for worsening pain, inability to walk, fever or for any concerns.

## 2017-05-07 ENCOUNTER — Encounter: Payer: Self-pay | Admitting: Interventional Cardiology

## 2017-05-07 LAB — URINE CULTURE

## 2017-05-08 ENCOUNTER — Telehealth: Payer: Self-pay | Admitting: Emergency Medicine

## 2017-05-08 NOTE — Telephone Encounter (Signed)
Post ED Visit - Positive Culture Follow-up  Culture report reviewed by antimicrobial stewardship pharmacist:  [x]  Elenor Quinones, Pharm.D. []  Heide Guile, Pharm.D., BCPS AQ-ID []  Parks Neptune, Pharm.D., BCPS []  Alycia Rossetti, Pharm.D., BCPS []  [place], Pharm.D., BCPS, AAHIVP []  Legrand Como, Pharm.D., BCPS, AAHIVP []  Salome Arnt, PharmD, BCPS []  Dimitri Ped, PharmD, BCPS []  Vincenza Hews, PharmD, BCPS  Positive urine culture Treated with cephalexin, organism sensitive to the same and no further patient follow-up is required at this time.  Hazle Nordmann 05/08/2017, 12:34 PM

## 2017-05-14 ENCOUNTER — Ambulatory Visit (INDEPENDENT_AMBULATORY_CARE_PROVIDER_SITE_OTHER): Payer: Medicare Other | Admitting: Pharmacist

## 2017-05-14 DIAGNOSIS — Z5181 Encounter for therapeutic drug level monitoring: Secondary | ICD-10-CM

## 2017-05-14 DIAGNOSIS — I4891 Unspecified atrial fibrillation: Secondary | ICD-10-CM | POA: Diagnosis not present

## 2017-05-14 LAB — POCT INR: INR: 2.3

## 2017-05-16 DIAGNOSIS — R296 Repeated falls: Secondary | ICD-10-CM | POA: Diagnosis not present

## 2017-05-16 DIAGNOSIS — M7632 Iliotibial band syndrome, left leg: Secondary | ICD-10-CM | POA: Diagnosis not present

## 2017-05-16 DIAGNOSIS — M533 Sacrococcygeal disorders, not elsewhere classified: Secondary | ICD-10-CM | POA: Diagnosis not present

## 2017-05-17 ENCOUNTER — Encounter: Payer: PRIVATE HEALTH INSURANCE | Admitting: Rehabilitative and Restorative Service Providers"

## 2017-05-20 ENCOUNTER — Other Ambulatory Visit: Payer: Self-pay | Admitting: Interventional Cardiology

## 2017-05-20 ENCOUNTER — Ambulatory Visit: Payer: Medicare Other | Admitting: Rehabilitative and Restorative Service Providers"

## 2017-05-20 DIAGNOSIS — R2689 Other abnormalities of gait and mobility: Secondary | ICD-10-CM

## 2017-05-20 DIAGNOSIS — M6281 Muscle weakness (generalized): Secondary | ICD-10-CM

## 2017-05-20 NOTE — Therapy (Signed)
[facility] [address] [place], [place], [address] Phone: [phone]   Fax:  [phone]  Physical Therapy Treatment  Patient Details  Name: Ariel Avila MRN: [id number] Date of Birth: 1930-03-03 Referring Provider: Vicie Mutters, MD  Encounter Date: 05/20/2017      PT End of Session - 05/20/17 1954    Visit Number 14   Number of Visits 20   Date for PT Re-Evaluation 05/10/17   Authorization Type G code every 10th visit   PT Start Time 0938   PT Stop Time 1020   PT Time Calculation (min) 42 min   Equipment Utilized During Treatment --   Activity Tolerance Other (comment);Treatment limited secondary to medical complications (Comment)  nausea and dizziness after lying down, elevated BP   Behavior During Therapy [facility] for tasks assessed/performed      Past Medical History:  Diagnosis Date  . Allergy   . Arthritis    "fingers and back" (06/23/2014)  . Basal cell carcinoma   . CHF (congestive heart failure) ([place])   . Chronic bronchitis ([place])    "lately" (06/23/2014)  . Chronic kidney disease (CKD), stage III (moderate)   . Dysrhythmia   . GERD (gastroesophageal reflux disease)   . H/O hiatal hernia   . Hyperlipidemia   . Hypertension   . Osteoporosis   . Paroxysmal A-fib ([place])   . Type II diabetes mellitus ([place])     Past Surgical History:  Procedure Laterality Date  . ABDOMINAL HYSTERECTOMY    . APPENDECTOMY    . BASAL CELL CARCINOMA EXCISION     "face and nose"  . CARDIOVERSION N/A 10/24/2015   Procedure: CARDIOVERSION;  Surgeon: Pixie Casino, MD;  Location: [facility] ENDOSCOPY;  Service: Cardiovascular;  Laterality: N/A;    There were no vitals filed for this visit.      Subjective Assessment - 05/20/17 0938    Subjective The patient went to ED due to hematoma after fall in early May..  She arrives to therapy with order for SI joint pain and IT band syndrome.  HEP -- patient unable to tolerate ankle  pumps and ankle alphabet.  L anterior/lateral tibialis pain most prominent with exercise and walking.  L SI pain when walking or leaning back in chair and she is point tender to palpation L SI.  L greater trochanter pain "not as bad" as othe rpain.  L foot does not catch when using the walker per report.    Patient is accompained by: Family member  Ariel Avila, daughter   Patient Stated Goals Reduce pain. Improve mobility.   Currently in Pain? Yes   Pain Score --  See pain descriptions above.             [facility] PT Assessment - 05/20/17 0947      Palpation   Palpation comment L distal knee/lateral- patient sore over extensor digitorum longus and anterior tibialis.  Tender over L greater trochanter (superiorly) and at muscle trigger point 2 inches distal to greater trochanter.  L SI joint and L sciatic notch are also painful to palpation.      Ambulation/Gait   Ambulation/Gait Yes   Ambulation/Gait Assistance 6: Modified independent (Device/Increase time)   Ambulation Distance (Feet) 200 Feet   Assistive device Rolling walker   Ambulation Surface Level   Gait Comments patient is using a rolling walker and is now wearing more supportive shoes (new balance versus previously wore sketchers).  PT encouraged continued use of  RW and wearing New Balance for improved LE support.                       [facility] Adult PT Treatment/Exercise - 05/20/17 0947      Self-Care   Self-Care Other Self-Care Comments   Other Self-Care Comments  Recommended patient only use foam roller on L IT band due to superficial nerve structures in distal LE and this increases pain significantly at home.  Encouraged patient to continue to use RW for safety due to recurent falls.     Exercises   Exercises Lumbar;Knee/Hip     Lumbar Exercises: Stretches   Active Hamstring Stretch 1 rep  seated   Active Hamstring Stretch Limitations reviewed from prior HEP reviewing technique   Passive Hamstring Stretch 2 reps   supine   Passive Hamstring Stretch Limitations with passive overpressure   Single Knee to Chest Stretch 2 reps   Single Knee to Chest Stretch Limitations Moved L knee towards right shoulder for piriformis stretch-added to HEP   Piriformis Stretch 1 rep  did not add to HEP due to discomfort/pain   Piriformis Stretch Limitations patient has increased pain when ER L Hip, therefore provided as L knee to R shoulder to modify.     Lumbar Exercises: Supine   Bridge 10 reps     Manual Therapy   Manual Therapy Soft tissue mobilization   Manual therapy comments L lateral/distal LE; L IT band   Soft tissue mobilization gentle/superficial distal soft tissue massage; L IT band trigger point release     Ankle Exercises: Stretches   Gastroc Stretch 2 reps  modified   Other Stretch Patient c/o significant pain with standing calf stretch.  PT modified recommending no flexion of anterior LE--but rather standing in stride with gentle anterior shift of hips for more gentle/tolerable stretch.                 PT Education - 05/20/17 1947    Education provided Yes   Education Details Modified HEP and gave updated handout   Person(s) Educated Patient;Child(ren)   Methods Explanation;Demonstration;Handout   Comprehension Verbalized understanding;Returned demonstration          PT Short Term Goals - 02/25/17 1257      PT SHORT TERM GOAL #1   Title The patient will be indep in HEP for habituation for horizontal head motion, slow gaze activities, and moving sit>R sidelying to improve tolerance to functional activities.   Baseline Met on 02/22/2017   Time 4   Period Weeks   Status Achieved     PT SHORT TERM GOAL #2   Title The patient will have negative R sidelying and/or R dix hallpike testing indicating resolution of R BPPV.   Baseline Met on 02/25/2017   Time 4   Period Weeks   Status Achieved     PT SHORT TERM GOAL #3   Title The patient will tolerate seated horizontal head turns x  5 reps with no nausea noted.   Baseline Patient continues with mild dizziness, but no nausea at this time (02/22/2017)   Time 4   Period Weeks   Status Achieved     PT SHORT TERM GOAL #4   Title The patient will tolerate rolling supine<>bilateral sidelying without nausea noted and change in dizziness from baseline<2/10.   Baseline Met on 02/25/2017   Time 4   Period Weeks   Status Achieved     PT SHORT TERM GOAL #  5   Title The patient will be further assessed on gait speed and balance *needed CGA to leave clinic on eval* and goals to follow, as indicated.   Baseline Gait speed=2.27 ft/sec, Berg=20/56   Time 4   Period Weeks   Status Achieved           PT Long Term Goals - 05/20/17 2229      PT LONG TERM GOAL #1   Title The patient will return demo HEP progression for LE strength, balance and dizziness as indicated.   Baseline REVISED TARGET DATE 06/20/17   Time 4   Period Weeks   Status Revised     PT LONG TERM GOAL #2   Title The patient will demonstrate stepping up onto 6" curb in order to improve ability to take dogs out and access community surfaces.   Baseline REVISED TARGET DATE 06/20/17   Time 4   Period Weeks   Status Revised     PT LONG TERM GOAL #3   Title The patient will be further assessed on steps and will return demo step to pattern in order to reduce pressure on left knee.   Baseline Met on 04/23/17   Time 4   Period Weeks   Status Achieved     PT LONG TERM GOAL #4   Title The patient will improve Berg balance score to > or equal to 26/56.   Baseline REVISED TARGET DATE 06/20/17   Time 4   Period Weeks   Status Revised     PT LONG TERM GOAL #5   Title The patient will be further assessed on grassy surfaces and will verbalize recommendations for safety to reduce falls as she takes dogs out in yard.   Baseline REVISED TARGET DATE 06/20/17   Time 4   Period Weeks   Status Revised               Plan - 05/20/17 2230    Clinical Impression  Statement The patient is continuing to have distal LE pain and after a recent fall now presents with some soreness in L SI region, L ITB, and distal L LE.  The knee pain is significantly improved.  The patient is currently using a RW and this is encouraged by PT to continue as the falls she is having are leading to pain and further limitations in mobility.  PT to progress balance, gait, LE strengthening and flexibility to address deficits and optimize functional status. A new order was received today by primary care MD to continue PT for L SI and ITB.   PT Frequency 1x / week   PT Duration 4 weeks   PT Treatment/Interventions ADLs/Self Care Home Management;Canalith Repostioning;Therapeutic activities;Therapeutic exercise;Balance training;Neuromuscular re-education;Gait training;Patient/family education;Vestibular;Ultrasound;Moist Heat;Cryotherapy;Electrical Stimulation;Functional mobility training;Manual techniques   PT Next Visit Plan Check updated HEP; balance training; gait training; pain reduction to improve mobility.   Consulted and Agree with Plan of Care Patient   Family Member Consulted daughter present      Patient will benefit from skilled therapeutic intervention in order to improve the following deficits and impairments:  Abnormal gait, Decreased balance, Dizziness, Difficulty walking, Decreased mobility, Decreased strength, Pain, Impaired flexibility  Visit Diagnosis: Other abnormalities of gait and mobility  Muscle weakness (generalized)     Problem List Patient Active Problem List   Diagnosis Date Noted  . On amiodarone therapy 02/21/2016  . Acute on chronic diastolic heart failure ([place]) 10/29/2015  . Persistent atrial fibrillation ([place])   .  Nausea vomiting and diarrhea 12/04/2014  . Hypoglycemia 12/03/2014  . Diabetes mellitus type 2, controlled ([place]) 12/03/2014  . CKD (chronic kidney disease) stage 3, GFR 30-59 ml/min 12/03/2014  . Hiatal hernia 12/03/2014  . GERD  (gastroesophageal reflux disease) 12/03/2014  . Bronchitis, acute, with bronchospasm 06/23/2014  . Encounter for therapeutic drug monitoring 04/12/2014  . Atrial fibrillation ([place]) 11/18/2013  . Essential hypertension 11/18/2013  . Hyperlipidemia 11/18/2013  . Diabetes mellitus type 2, uncontrolled, with complications ([place]) 15/94/7076  . Hyperaldosteronism ([place]) 11/18/2013  . Chronic diastolic heart failure ([place]) 11/18/2013    Class: Chronic    Ariel Avila, PT 05/20/2017, 10:34 PM  [facility] [address] [place], [place], [address] Phone: [phone]   Fax:  [phone]  Name: Ariel Avila MRN: [id number] Date of Birth: 1930/12/23

## 2017-05-20 NOTE — Patient Instructions (Signed)
BEGIN EXERCISES JUST ONE TIME PER DAY.  IF YOU EXPERIENCE SORENESS, DIVIDE EXERCISES INTO 2 GROUPS-- DO SOME IN MORNING/SOME IN AFTERNOON.  IF PAIN CONTINUES, HOLD UNTIL RETURN TO THERAPY.  Heel Cord Stretch    KEEP FRONT LEG STRAIGHT FOR NOW* Place one leg forward, bent, other leg behind and straight. Lean forward keeping back heel flat. Hold __30__ seconds while counting out loud. Repeat with other leg. Repeat __2__ times. Do _2___ sessions per day.  http://[email or web address].exer.us/512   Copyright  VHI. All rights reserved.   ANKLE: Circles    Move feet in a circle clockwise, then counterclockwise. _5_ reps per set, _1__ sets per day, __2_ days per week   Copyright  VHI. All rights reserved.    Butterfly, Supine    Lie on back, feet together. Lower knees toward floor. Hold _20-30__ seconds. Repeat __3_ times per session. Do __3_ sessions per day.  Copyright  VHI. All rights reserved.  Straight Leg Raise    Bend one leg. Raise other leg _8__ inches with knee locked. Exhale and tighten thigh muscles while raising leg. Repeat with other leg. Repeat _5__ times. Do _2___ sessions per day.  http://[email or web address].exer.us/270  Copyright  VHI. All rights reserved. Chair Sitting    Sit at edge of seat, spine straight, one leg extended, lean forward slowly. Hold __20_ seconds. Repeat _3__ times per session. Do __2_ sessions per day.  Copyright  VHI. All rights reserved. Bridge    Lie back, legs bent. Inhale, pressing hips up. Keeping ribs in, lengthen lower back. Exhale, rolling down along spine from top. Repeat _10___ times. Do __2__ sessions per day.  http://pm.exer.us/55   Copyright  VHI. All rights reserved.      Electronically signed by Mervyn Gay, PT at 03/25/2017 9:56 AM    Piriformis Stretch    Lying on back, pull right knee toward opposite shoulder. Hold __20__ seconds.  FOCUS: ON LEFT KNEE TO OPPOSITE SHOULDER.  Repeat __2__ times. Do __1__  sessions per day.  http://[email or web address].exer.us/258   Copyright  VHI. All rights reserved.

## 2017-05-23 ENCOUNTER — Ambulatory Visit: Payer: Medicare Other | Admitting: Interventional Cardiology

## 2017-05-24 DIAGNOSIS — Z7901 Long term (current) use of anticoagulants: Secondary | ICD-10-CM | POA: Diagnosis not present

## 2017-05-24 DIAGNOSIS — E785 Hyperlipidemia, unspecified: Secondary | ICD-10-CM | POA: Diagnosis not present

## 2017-05-24 DIAGNOSIS — Z7984 Long term (current) use of oral hypoglycemic drugs: Secondary | ICD-10-CM | POA: Diagnosis not present

## 2017-05-24 DIAGNOSIS — I10 Essential (primary) hypertension: Secondary | ICD-10-CM | POA: Diagnosis not present

## 2017-05-24 DIAGNOSIS — R748 Abnormal levels of other serum enzymes: Secondary | ICD-10-CM | POA: Diagnosis not present

## 2017-05-24 DIAGNOSIS — N189 Chronic kidney disease, unspecified: Secondary | ICD-10-CM | POA: Diagnosis not present

## 2017-05-24 DIAGNOSIS — E1142 Type 2 diabetes mellitus with diabetic polyneuropathy: Secondary | ICD-10-CM | POA: Diagnosis not present

## 2017-05-24 DIAGNOSIS — L659 Nonscarring hair loss, unspecified: Secondary | ICD-10-CM | POA: Diagnosis not present

## 2017-05-24 DIAGNOSIS — R600 Localized edema: Secondary | ICD-10-CM | POA: Diagnosis not present

## 2017-05-28 ENCOUNTER — Ambulatory Visit: Payer: Medicare Other | Admitting: Rehabilitative and Restorative Service Providers"

## 2017-05-28 DIAGNOSIS — M6281 Muscle weakness (generalized): Secondary | ICD-10-CM

## 2017-05-28 DIAGNOSIS — R2689 Other abnormalities of gait and mobility: Secondary | ICD-10-CM | POA: Diagnosis not present

## 2017-05-28 NOTE — Therapy (Signed)
[place] [address] [place], [place], [address] Phone: [phone]   Fax:  [phone]  Physical Therapy Treatment  Patient Details  Name: Ariel Avila MRN: [id number] Date of Birth: 1930/03/08 Referring Provider:  Victorino December, MD Yaakov Guthrie, MD  Encounter Date: 05/28/2017      PT End of Session - 05/28/17 1323    Visit Number 15   Number of Visits 20   Date for PT Re-Evaluation 06/20/17   Authorization Type G code every 10th visit   PT Start Time 0933   PT Stop Time 1015   PT Time Calculation (min) 42 min   Activity Tolerance Patient tolerated treatment well   Behavior During Therapy [facility] for tasks assessed/performed      Past Medical History:  Diagnosis Date  . Allergy   . Arthritis    "fingers and back" (06/23/2014)  . Basal cell carcinoma   . CHF (congestive heart failure) ([place])   . Chronic bronchitis ([place])    "lately" (06/23/2014)  . Chronic kidney disease (CKD), stage III (moderate)   . Dysrhythmia   . GERD (gastroesophageal reflux disease)   . H/O hiatal hernia   . Hyperlipidemia   . Hypertension   . Osteoporosis   . Paroxysmal A-fib ([place])   . Type II diabetes mellitus ([place])     Past Surgical History:  Procedure Laterality Date  . ABDOMINAL HYSTERECTOMY    . APPENDECTOMY    . BASAL CELL CARCINOMA EXCISION     "face and nose"  . CARDIOVERSION N/A 10/24/2015   Procedure: CARDIOVERSION;  Surgeon: Pixie Casino, MD;  Location: [facility] ENDOSCOPY;  Service: Cardiovascular;  Laterality: N/A;    There were no vitals filed for this visit.      Subjective Assessment - 05/28/17 0938    Subjective The patient is feeling better.  She reports she can walk in the house without the walker, but is still using it when out of the home.  The patient reports pain is worst on L lateral leg, worse with walking.  She notes it does wake her up at night intermittently.  Pain gets worse with longer distances.   She denies numbness or tingling in leg.  She notes L SI pain does not hurt with walking, but she notes mild discomfort if she rubs her L low back/ SI Region.  Piriformis stretch at home pulls too much in L gluteal to perform.    Patient is accompained by: Family member  daughter   Patient Stated Goals Reduce pain. Improve mobility.   Currently in Pain? Yes   Pain Score 4    Pain Location Leg   Pain Orientation Left;Lower;Lateral   Pain Descriptors / Indicators Aching   Pain Type Acute pain   Pain Onset More than a month ago   Pain Frequency Intermittent   Aggravating Factors  walking   Pain Relieving Factors rest improves            [facility] PT Assessment - 05/28/17 0956      ROM / Strength   AROM / PROM / Strength Strength     Strength   Overall Strength Comments L hip flexion 4+/5, L knee flexion 4/5, L knee extension 5/5, L anke DF 4/5, L ankle inversion 5/5, L ankle everison 5/5.      Ambulation/Gait   Gait velocity 2.25 ft/sec     Standardized Balance Assessment   Standardized Balance Assessment Berg Balance Test  Berg Balance Test   Sit to Stand Able to stand  independently using hands   Standing Unsupported Able to stand safely 2 minutes   Sitting with Back Unsupported but Feet Supported on Floor or Stool Able to sit safely and securely 2 minutes   Stand to Sit Controls descent by using hands   Transfers Able to transfer safely, definite need of hands   Standing Unsupported with Eyes Closed Able to stand 10 seconds with supervision   Standing Ubsupported with Feet Together Needs help to attain position but able to stand for 30 seconds with feet together   From Standing, Reach Forward with Outstretched Arm Reaches forward but needs supervision   From Standing Position, Pick up Object from [place] to pick up shoe, needs supervision   From Standing Position, Turn to Look Behind Over each Shoulder Turn sideways only but maintains balance   Turn 360 Degrees Able to  turn 360 degrees safely but slowly   Standing Unsupported, Alternately Place Feet on Step/Stool Able to complete >2 steps/needs minimal assist   Standing Unsupported, One Foot in Front Able to take small step independently and hold 30 seconds   Standing on One Leg Unable to try or needs assist to prevent fall   Total Score 32   Berg comment: 32/56                     [facility] Adult PT Treatment/Exercise - 05/28/17 4097      Ambulation/Gait   Ambulation/Gait Yes   Ambulation/Gait Assistance 6: Modified independent (Device/Increase time)   Ambulation/Gait Assistance Details with rolling walker and without device   Ambulation Distance (Feet) 200 Feet   Assistive device Rolling walker;None   Ambulation Surface Level   Stairs Yes   Stairs Assistance 6: Modified independent (Device/Increase time)   Stair Management Technique Step to pattern;Two rails   Number of Stairs 4     Self-Care   Self-Care Other Self-Care Comments   Other Self-Care Comments  Educated patient/daughter on home safety and fall prevention discussing night lights, removal of throw rugs, dec'd clutter.  PT recommended patient continue to use RW when out of her home.  She is limiting her mobility noting she is afraid of falling.  PT discussed still getting out with family and RW use to maintain mobility and continue strengthening. Patient/daughter verbalize understanding.      Exercises   Exercises Other Exercises   Other Exercises  Reviewed all HEP including:  modifying position for supine pirformis stretch, seated and supine hamstring stretch, bridges, SLR, ankle circles and heel cord stretch.                PT Education - 05/28/17 1322    Education provided Yes   Education Details Recommended continuation of current HEP    Person(s) Educated Patient;Child(ren)   Methods Explanation;Demonstration;Handout   Comprehension Verbalized understanding;Returned demonstration          PT Short Term  Goals - 02/25/17 1257      PT SHORT TERM GOAL #1   Title The patient will be indep in HEP for habituation for horizontal head motion, slow gaze activities, and moving sit>R sidelying to improve tolerance to functional activities.   Baseline Met on 02/22/2017   Time 4   Period Weeks   Status Achieved     PT SHORT TERM GOAL #2   Title The patient will have negative R sidelying and/or R dix hallpike testing indicating resolution of R  BPPV.   Baseline Met on 02/25/2017   Time 4   Period Weeks   Status Achieved     PT SHORT TERM GOAL #3   Title The patient will tolerate seated horizontal head turns x 5 reps with no nausea noted.   Baseline Patient continues with mild dizziness, but no nausea at this time (02/22/2017)   Time 4   Period Weeks   Status Achieved     PT SHORT TERM GOAL #4   Title The patient will tolerate rolling supine<>bilateral sidelying without nausea noted and change in dizziness from baseline<2/10.   Baseline Met on 02/25/2017   Time 4   Period Weeks   Status Achieved     PT SHORT TERM GOAL #5   Title The patient will be further assessed on gait speed and balance *needed CGA to leave clinic on eval* and goals to follow, as indicated.   Baseline Gait speed=2.27 ft/sec, Berg=20/56   Time 4   Period Weeks   Status Achieved           PT Long Term Goals - 05/28/17 1001      PT LONG TERM GOAL #1   Title The patient will return demo HEP progression for LE strength, balance and dizziness as indicated.   Baseline REVISED TARGET DATE 06/20/17   Time 4   Period Weeks   Status Achieved     PT LONG TERM GOAL #2   Title The patient will demonstrate stepping up onto 6" curb in order to improve ability to take dogs out and access community surfaces.   Baseline *PT recommended she use rollater RW for safety with community negotiation.    Time 4   Period Weeks   Status Not Met     PT LONG TERM GOAL #3   Title The patient will be further assessed on steps and will  return demo step to pattern in order to reduce pressure on left knee.   Baseline Met on 04/23/17   Time 4   Period Weeks   Status Achieved     PT LONG TERM GOAL #4   Title The patient will improve Berg balance score to > or equal to 26/56.   Baseline Patinet scores 32/56.    Time 4   Period Weeks   Status Achieved     PT LONG TERM GOAL #5   Title The patient will be further assessed on grassy surfaces and will verbalize recommendations for safety to reduce falls as she takes dogs out in yard.   Baseline PT recommended using rollater RW for safety when in the community.     Time 4   Period Weeks   Status Deferred               Plan - 05/28/17 1432    Clinical Impression Statement The patient partially met LTGs.  She continues with distal LE pain (distal to knee and lateral), tenderness to palpation of anterior tibialis.  She denies numbness/tingling in the lower leg.  When patient initially presented with L LE distal/lateral pain, I checked the distal strap on her knee brace and it was donned so that 2-3 fingers could fit under strap--making pressure on nerve an unlikely cause of pain.  When ambulating with the knee brace, it limited knee flexion during swing phase of gait and the patient was having to work harder to clear the left foot and most likely compensated by using increased ankle dorsiflexion.   This compensatory strategy  could've created strain in the muscle.  However, it would be expected that this would be improving by this time as patient stopped wearing knee brace b/c lateral knee pain improved.  PT recommended she f/u with MD if this pain does not improve over next 2 weeks for further medical workup. The patient denies back pain.  She notes pain in L4 distribution at this time--which could be due to anterior tibialis, ankle evertor strain vs lumbar referral pattern.  Patient has had multiple falls which have increased her risk for injury.    Clinical Impairments Affecting  Rehab Potential complex clinical presentation involving multiple systems   PT Treatment/Interventions ADLs/Self Care Home Management;Canalith Repostioning;Therapeutic activities;Therapeutic exercise;Balance training;Neuromuscular re-education;Gait training;Patient/family education;Vestibular;Ultrasound;Moist Heat;Cryotherapy;Electrical Stimulation;Functional mobility training;Manual techniques   PT Next Visit Plan Patient on hold: working on HEP, f/u with MD if distal leg pain persists.  PT will wait 2-3 weeks to ensure no difficulties with HEP and then do d/c.    Consulted and Agree with Plan of Care Patient   Family Member Consulted daughter present      Patient will benefit from skilled therapeutic intervention in order to improve the following deficits and impairments:  Abnormal gait, Decreased balance, Dizziness, Difficulty walking, Decreased mobility, Decreased strength, Pain, Impaired flexibility  Visit Diagnosis: Other abnormalities of gait and mobility  Muscle weakness (generalized)       G-Codes - 31-May-2017 1646    Functional Assessment Tool Used (Outpatient Only) knee pain resolved- distal LE pain (lateral and distal to knee) in anterior tibialis/evertors   Functional Limitation Mobility: Walking and moving around   Mobility: Walking and Moving Around Goal Status [phone]) At least 20 percent but less than 40 percent impaired, limited or restricted   Mobility: Walking and Moving Around Discharge Status [phone]) At least 20 percent but less than 40 percent impaired, limited or restricted      Problem List Patient Active Problem List   Diagnosis Date Noted  . On amiodarone therapy 02/21/2016  . Acute on chronic diastolic heart failure ([place]) 10/29/2015  . Persistent atrial fibrillation ([place])   . Nausea vomiting and diarrhea 12/04/2014  . Hypoglycemia 12/03/2014  . Diabetes mellitus type 2, controlled ([place]) 12/03/2014  . CKD (chronic kidney disease) stage 3, GFR 30-59 ml/min  12/03/2014  . Hiatal hernia 12/03/2014  . GERD (gastroesophageal reflux disease) 12/03/2014  . Bronchitis, acute, with bronchospasm 06/23/2014  . Encounter for therapeutic drug monitoring 04/12/2014  . Atrial fibrillation ([place]) 11/18/2013  . Essential hypertension 11/18/2013  . Hyperlipidemia 11/18/2013  . Diabetes mellitus type 2, uncontrolled, with complications ([place]) 90/93/1121  . Hyperaldosteronism ([place]) 11/18/2013  . Chronic diastolic heart failure ([place]) 11/18/2013    Class: Chronic    Ajahni Nay, PT May 31, 2017, 4:50 PM  [place] [address] [place], [place], [address] Phone: [phone]   Fax:  [phone]  Name: Ariel Avila MRN: [id number] Date of Birth: [birth year]

## 2017-05-29 ENCOUNTER — Ambulatory Visit (INDEPENDENT_AMBULATORY_CARE_PROVIDER_SITE_OTHER): Payer: Medicare Other | Admitting: Nurse Practitioner

## 2017-05-29 ENCOUNTER — Other Ambulatory Visit: Payer: Self-pay | Admitting: *Deleted

## 2017-05-29 ENCOUNTER — Encounter: Payer: Self-pay | Admitting: Nurse Practitioner

## 2017-05-29 VITALS — BP 138/66 | HR 56 | Ht 64.0 in | Wt 138.8 lb

## 2017-05-29 DIAGNOSIS — E7800 Pure hypercholesterolemia, unspecified: Secondary | ICD-10-CM

## 2017-05-29 DIAGNOSIS — I5032 Chronic diastolic (congestive) heart failure: Secondary | ICD-10-CM | POA: Diagnosis not present

## 2017-05-29 DIAGNOSIS — Z79899 Other long term (current) drug therapy: Secondary | ICD-10-CM

## 2017-05-29 DIAGNOSIS — I480 Paroxysmal atrial fibrillation: Secondary | ICD-10-CM

## 2017-05-29 LAB — CBC
Hematocrit: 34.6 % (ref 34.0–46.6)
Hemoglobin: 11.7 g/dL (ref 11.1–15.9)
MCH: 29.9 pg (ref 26.6–33.0)
MCHC: 33.8 g/dL (ref 31.5–35.7)
MCV: 89 fL (ref 79–97)
Platelets: 197 10*3/uL (ref 150–379)
RBC: 3.91 x10E6/uL (ref 3.77–5.28)
RDW: 14.9 % (ref 12.3–15.4)
WBC: 4.2 10*3/uL (ref 3.4–10.8)

## 2017-05-29 LAB — BASIC METABOLIC PANEL
BUN/Creatinine Ratio: 13 (ref 12–28)
BUN: 27 mg/dL (ref 8–27)
CO2: 24 mmol/L (ref 18–29)
Calcium: 8.7 mg/dL (ref 8.7–10.3)
Chloride: 101 mmol/L (ref 96–106)
Creatinine, Ser: 2.06 mg/dL — ABNORMAL HIGH (ref 0.57–1.00)
GFR calc Af Amer: 25 mL/min/{1.73_m2} — ABNORMAL LOW (ref >59)
GFR calc non Af Amer: 21 mL/min/{1.73_m2} — ABNORMAL LOW (ref >59)
Glucose: 240 mg/dL — ABNORMAL HIGH (ref 65–99)
Potassium: 4.2 mmol/L (ref 3.5–5.2)
Sodium: 139 mmol/L (ref 134–144)

## 2017-05-29 LAB — HEPATIC FUNCTION PANEL
ALT: 38 IU/L — ABNORMAL HIGH (ref 0–32)
AST: 59 IU/L — ABNORMAL HIGH (ref 0–40)
Albumin: 3.4 g/dL — ABNORMAL LOW (ref 3.5–4.7)
Alkaline Phosphatase: 149 IU/L — ABNORMAL HIGH (ref 39–117)
Bilirubin Total: 0.4 mg/dL (ref 0.0–1.2)
Bilirubin, Direct: 0.2 mg/dL (ref 0.00–0.40)
Total Protein: 5.4 g/dL — ABNORMAL LOW (ref 6.0–8.5)

## 2017-05-29 LAB — PRO B NATRIURETIC PEPTIDE: NT-Pro BNP: 1817 pg/mL — ABNORMAL HIGH (ref 0–738)

## 2017-05-29 LAB — TSH: TSH: 1.63 u[IU]/mL (ref 0.450–4.500)

## 2017-05-29 MED ORDER — LABETALOL HCL 300 MG PO TABS: 300.0000 mg | ORAL_TABLET | Freq: Two times a day (BID) | ORAL | 3 refills | Status: AC

## 2017-05-29 NOTE — Progress Notes (Signed)
CARDIOLOGY OFFICE NOTE  Date:  05/29/2017    Ariel Avila Date of Birth: 1930/07/05 Medical Record #[id number]  PCP:  Vernie Shanks, MD  Cardiologist:  Jennings Books   Chief Complaint  Patient presents with  . Atrial Fibrillation  . Congestive Heart Failure    Follow up visit - seen for Dr. Tamala Julian    History of Present Illness: Ariel Avila is a 83 y.o. female who presents today for a follow up visit. Seen for Dr. Tamala Julian.   She has a history of acute diastolic heart failure, atrial fibrillation on chronic amiodarone therapy, hypertension, chronic kidney disease III, hyperlipidemia, and CAD (diffuse calcification on past CT).   She has not been seen here since February of 2017 - she had had a recent hospitalization for colitis complicated by blood in the stool and an acute exacerbation of diastolic HF.   ER visit earlier this month after sustaining a fall - had progressive pain in the left buttock - remains on coumadin - coumadin was held for 2 days - also treated for a UTI. Advised to see PCP.   Comes in today. Here with her daughter Ariel Avila who is a Marine scientist at [employer] on the Rapid Response Team. Having more issues with swelling. Has always had some labile BP - she is on high dose Clonidine - .3mg  in the Am and .2mg  in the pm. Then with more swelling in her feet/legs - she was using more salt - likes canned soup and bacon/sausage - had trouble getting her shoes on - but now has cut back on the salt and this is improved. She has had several bouts of vertigo - or what was thought to be vertigo. This has made her fall as well as issues with her knee. This is now improved but she will still have some dizziness. No passing out. HR now in the mid 50's. No actual chest pain. Stable DOE. Weight is down 8 pounds since last visit here.   Past Medical History:  Diagnosis Date  . Allergy   . Arthritis    "fingers and back" (06/23/2014)  . Basal cell carcinoma   . CHF (congestive heart  failure) ([place])   . Chronic bronchitis ([place])    "lately" (06/23/2014)  . Chronic kidney disease (CKD), stage III (moderate)   . Dysrhythmia   . GERD (gastroesophageal reflux disease)   . H/O hiatal hernia   . Hyperlipidemia   . Hypertension   . Osteoporosis   . Paroxysmal A-fib ([place])   . Type II diabetes mellitus ([place])     Past Surgical History:  Procedure Laterality Date  . ABDOMINAL HYSTERECTOMY    . APPENDECTOMY    . BASAL CELL CARCINOMA EXCISION     "face and nose"  . CARDIOVERSION N/A 10/24/2015   Procedure: CARDIOVERSION;  Surgeon: Pixie Casino, MD;  [facility] ENDOSCOPY;  Service: Cardiovascular;  Laterality: N/A;     Medications: Current Outpatient Prescriptions  Medication Sig Dispense Refill  . acetaminophen (TYLENOL) 500 MG tablet Take 500 mg by mouth every 6 (six) hours as needed for moderate pain.    Marland Kitchen amiodarone (PACERONE) 200 MG tablet TAKE 1 TABLET BY MOUTH ONCE DAILY 30 tablet 1  . cloNIDine (CATAPRES) 0.2 MG tablet Take 0.2 mg by mouth 2 (two) times daily. 0.3 mg am  and 0.2 mg pm    . desloratadine (CLARINEX) 5 MG tablet Take 5 mg by mouth daily.    Marland Kitchen  ferrous sulfate 325 (65 FE) MG tablet Take 325 mg by mouth 2 (two) times daily.     . fluticasone (FLONASE) 50 MCG/ACT nasal spray Place 1 spray into both nostrils daily as needed for allergies or rhinitis.     . furosemide (LASIX) 20 MG tablet Take 40-80 mg by mouth every other day. Alternates between 40 and 80 mg every other day.    Marland Kitchen glimepiride (AMARYL) 1 MG tablet Take 1 tablet (1 mg total) by mouth daily with breakfast. 30 tablet 1  . hydrALAZINE (APRESOLINE) 50 MG tablet Take 50 mg by mouth 3 (three) times daily.     Marland Kitchen labetalol (NORMODYNE) 300 MG tablet Take 300 mg by mouth 2 (two) times daily.    Marland Kitchen Lifitegrast (XIIDRA) 5 % SOLN Apply 1 drop to eye 2 (two) times daily.    Marland Kitchen lovastatin (MEVACOR) 40 MG tablet Take 1 tablet (40 mg total) by mouth at bedtime. 30 tablet 10  . Multiple Vitamin  (MULTIVITAMIN WITH MINERALS) TABS tablet Take 1 tablet by mouth daily. One a Day Women's    . Multiple Vitamins-Minerals (EYE VITAMINS PO) Take 1 capsule by mouth daily.    . Multiple Vitamins-Minerals (HAIR/SKIN/NAILS/BIOTIN) TABS Take 1 tablet by mouth daily.    . pantoprazole (PROTONIX) 40 MG tablet Take 40 mg by mouth daily.    . Polyvinyl Alcohol-Povidone (REFRESH OP) Place 1 drop into both eyes 2 (two) times daily as needed (for dry eyes).    . potassium chloride (K-DUR) 10 MEQ tablet Take 0.5 tablets (5 mEq total) by mouth daily. (Patient taking differently: Take 10 mEq by mouth daily. ) 30 tablet 0  . vitamin A 8000 UNIT capsule Take 8,000 Units by mouth daily.    Marland Kitchen warfarin (COUMADIN) 2 MG tablet Take 1-2 tablets by mouth daily as directed by coumadin clinic (Patient taking differently: Take 2-4 mg by mouth daily at 6 PM. 4 mg everyday except on Sunday and Thursday patient takes 2 mg.) 50 tablet 2   No current facility-administered medications for this visit.     Allergies: Allergies  Allergen Reactions  . Levaquin [Levofloxacin] Hives, Itching and Other (See Comments)    insomnia  . Sulfa Antibiotics Rash  . Sulfacetamide Sodium Rash    Social History: The patient  reports that she has never smoked. She has never used smokeless tobacco. She reports that she does not drink alcohol or use drugs.   Family History: The patient's family history includes Heart attack in her brother, father, and paternal grandmother; Heart disease in her brother and father; Tuberculosis in her mother.   Review of Systems: Please see the history of present illness.   Otherwise, the review of systems is positive for none.   All other systems are reviewed and negative.   Physical Exam: VS:  BP 138/66 (BP Location: Left Arm, Patient Position: Sitting, Cuff Size: Normal)   Pulse (!) 56   Ht 5\' 4"  (1.626 m)   Wt 138 lb 12.8 oz (63 kg)   BMI 23.82 kg/m  .  BMI Body mass index is 23.82 kg/m.  Wt  Readings from Last 3 Encounters:  05/29/17 138 lb 12.8 oz (63 kg)  05/05/17 140 lb (63.5 kg)  06/15/16 144 lb (65.3 kg)    General: Pleasant. Elderly female who is alert and in no acute distress. Her weight is down 8 pounds since last visit here.    HEENT: Normal.  Neck: Supple, no JVD, carotid bruits, or masses noted.  Cardiac: Regular rate and rhythm. But slow. Trace edema.  Respiratory:  Lungs are clear to auscultation bilaterally with normal work of breathing.  GI: Soft and nontender.  MS: No deformity or atrophy. Gait and ROM intact.  Skin: Warm and dry. Color is normal.  Neuro:  Strength and sensation are intact and no gross focal deficits noted.  Psych: Alert, appropriate and with normal affect.   LABORATORY DATA:  EKG:  EKG is ordered today. This demonstrates sinus bradycardia. QT is 516. HR is 56.  Lab Results  Component Value Date   WBC 5.8 05/05/2017   HGB 11.5 (L) 05/05/2017   HCT 34.0 (L) 05/05/2017   PLT 178 05/05/2017   GLUCOSE 189 (H) 05/05/2017   ALT 39 05/05/2017   AST 53 (H) 05/05/2017   NA 139 05/05/2017   K 3.7 05/05/2017   CL 106 05/05/2017   CREATININE 1.98 (H) 05/05/2017   BUN 24 (H) 05/05/2017   CO2 24 05/05/2017   TSH 3.311 10/25/2015   INR 2.3 05/14/2017   HGBA1C 5.8 (H) 12/04/2014     Lab Results  Component Value Date   INR 2.3 05/14/2017   INR 3.15 05/05/2017   INR 2.2 04/15/2017     BNP (last 3 results) No results for input(s): BNP in the last 8760 hours.  ProBNP (last 3 results) No results for input(s): PROBNP in the last 8760 hours.   Other Studies Reviewed Today:  Echo Study Conclusions from 10/2015  - Left ventricle: The cavity size was normal. Wall thickness was   normal. Systolic function was normal. The estimated ejection   fraction was in the range of 60% to 65%. Wall motion was normal;   there were no regional wall motion abnormalities. Features are   consistent with a pseudonormal left ventricular filling  pattern,   with concomitant abnormal relaxation and increased filling   pressure (grade 2 diastolic dysfunction). - Mitral valve: There was moderate regurgitation. - Left atrium: The atrium was mildly dilated. - Pericardium, extracardiac: A trivial pericardial effusion was   identified.   Assessment/Plan:  1. Paroxysmal atrial fibrillation (HCC) - EKG today with NSR but more bradycardic - I suspect this is due to the Clonidine in addition to her other AV nodal blocking agents and amiodarone. Stopping CCB today. Needs surveillance labs today.    2. Essential hypertension - BP ok on current regimen. She has tended to have labile HTN. Stopping CCB today  3. Chronic diastolic heart failure (HCC) - weight is actually down - she is getting too much salt - she has cut back and this has helped her swelling. Stopping CCB as well today. Check BNP.   4. Chronic anticoagulation with Coumadin -  she has had some falls - attributed to vertigo and a bad knee which both are now improved.    5. Amiodarone therapy - needs surveillance labs. Recent increase in her LFTs - rechecking today.   6. Recent fall - she has had several - if this continues may need to readdress the coumadin.     Current medicines are reviewed with the patient today.  The patient does not have concerns regarding medicines other than what has been noted above.  The following changes have been made:  See above.  Labs/ tests ordered today include:    Orders Placed This Encounter  Procedures  . Basic metabolic panel  . CBC  . Hepatic function panel  . TSH  . EKG 12-Lead     Disposition:  FU with Dr. Tamala Julian as planned.   Patient is agreeable to this plan and will call if any problems develop in the interim.   SignedTruitt Merle, NP  05/29/2017 8:37 AM  [place] [address] [place], [place]  [address] Phone: [phone] Fax: [phone]

## 2017-05-29 NOTE — Patient Instructions (Addendum)
We will be checking the following labs today - BMET, CBC, HPF, BNP and TSH   Medication Instructions:    Continue with your current medicines. BUT   I am stopping the Cartia (Diltiazem)     Testing/Procedures To Be Arranged:  N/A   Follow-Up:   See Dr. Tamala Julian as planned in July   Other Special Instructions:   N/A    If you need a refill on your cardiac medications before your next appointment, please call your pharmacy.   Call the [place] office at [phone] if you have any questions, problems or concerns.

## 2017-06-07 ENCOUNTER — Ambulatory Visit: Payer: Medicare Other | Admitting: Rehabilitative and Restorative Service Providers"

## 2017-06-11 ENCOUNTER — Ambulatory Visit (INDEPENDENT_AMBULATORY_CARE_PROVIDER_SITE_OTHER): Payer: Medicare Other | Admitting: Pharmacist Clinician (PhC)/ Clinical Pharmacy Specialist

## 2017-06-11 DIAGNOSIS — Z5181 Encounter for therapeutic drug level monitoring: Secondary | ICD-10-CM | POA: Diagnosis not present

## 2017-06-11 DIAGNOSIS — I4891 Unspecified atrial fibrillation: Secondary | ICD-10-CM | POA: Diagnosis not present

## 2017-06-11 LAB — POCT INR: INR: 3.4

## 2017-06-14 ENCOUNTER — Ambulatory Visit: Payer: Medicare Other | Admitting: Rehabilitative and Restorative Service Providers"

## 2017-06-19 ENCOUNTER — Encounter (HOSPITAL_COMMUNITY): Payer: Self-pay | Admitting: Emergency Medicine

## 2017-06-19 ENCOUNTER — Emergency Department (HOSPITAL_COMMUNITY): Payer: Medicare Other

## 2017-06-19 DIAGNOSIS — Y999 Unspecified external cause status: Secondary | ICD-10-CM | POA: Diagnosis not present

## 2017-06-19 DIAGNOSIS — I509 Heart failure, unspecified: Secondary | ICD-10-CM | POA: Insufficient documentation

## 2017-06-19 DIAGNOSIS — M25462 Effusion, left knee: Secondary | ICD-10-CM | POA: Diagnosis not present

## 2017-06-19 DIAGNOSIS — Y929 Unspecified place or not applicable: Secondary | ICD-10-CM | POA: Insufficient documentation

## 2017-06-19 DIAGNOSIS — Z85828 Personal history of other malignant neoplasm of skin: Secondary | ICD-10-CM | POA: Insufficient documentation

## 2017-06-19 DIAGNOSIS — E1122 Type 2 diabetes mellitus with diabetic chronic kidney disease: Secondary | ICD-10-CM | POA: Diagnosis not present

## 2017-06-19 DIAGNOSIS — S3992XA Unspecified injury of lower back, initial encounter: Secondary | ICD-10-CM | POA: Diagnosis not present

## 2017-06-19 DIAGNOSIS — N183 Chronic kidney disease, stage 3 (moderate): Secondary | ICD-10-CM | POA: Diagnosis not present

## 2017-06-19 DIAGNOSIS — W1789XA Other fall from one level to another, initial encounter: Secondary | ICD-10-CM | POA: Insufficient documentation

## 2017-06-19 DIAGNOSIS — I130 Hypertensive heart and chronic kidney disease with heart failure and stage 1 through stage 4 chronic kidney disease, or unspecified chronic kidney disease: Secondary | ICD-10-CM | POA: Insufficient documentation

## 2017-06-19 DIAGNOSIS — S8992XA Unspecified injury of left lower leg, initial encounter: Secondary | ICD-10-CM | POA: Diagnosis not present

## 2017-06-19 DIAGNOSIS — Z7901 Long term (current) use of anticoagulants: Secondary | ICD-10-CM | POA: Diagnosis not present

## 2017-06-19 DIAGNOSIS — H16223 Keratoconjunctivitis sicca, not specified as Sjogren's, bilateral: Secondary | ICD-10-CM | POA: Diagnosis not present

## 2017-06-19 DIAGNOSIS — H01001 Unspecified blepharitis right upper eyelid: Secondary | ICD-10-CM | POA: Diagnosis not present

## 2017-06-19 DIAGNOSIS — M545 Low back pain: Secondary | ICD-10-CM | POA: Insufficient documentation

## 2017-06-19 DIAGNOSIS — H16122 Filamentary keratitis, left eye: Secondary | ICD-10-CM | POA: Diagnosis not present

## 2017-06-19 DIAGNOSIS — E113293 Type 2 diabetes mellitus with mild nonproliferative diabetic retinopathy without macular edema, bilateral: Secondary | ICD-10-CM | POA: Diagnosis not present

## 2017-06-19 DIAGNOSIS — M25562 Pain in left knee: Secondary | ICD-10-CM | POA: Diagnosis not present

## 2017-06-19 DIAGNOSIS — H01002 Unspecified blepharitis right lower eyelid: Secondary | ICD-10-CM | POA: Diagnosis not present

## 2017-06-19 DIAGNOSIS — S0990XA Unspecified injury of head, initial encounter: Secondary | ICD-10-CM | POA: Diagnosis not present

## 2017-06-19 DIAGNOSIS — S199XXA Unspecified injury of neck, initial encounter: Secondary | ICD-10-CM | POA: Diagnosis not present

## 2017-06-19 DIAGNOSIS — Y939 Activity, unspecified: Secondary | ICD-10-CM | POA: Insufficient documentation

## 2017-06-19 MED ORDER — OXYCODONE-ACETAMINOPHEN 5-325 MG PO TABS
ORAL_TABLET | ORAL | Status: AC
  Filled 2017-06-19: qty 1

## 2017-06-19 MED ORDER — ONDANSETRON 4 MG PO TBDP
ORAL_TABLET | ORAL | Status: AC
  Filled 2017-06-19: qty 1

## 2017-06-19 NOTE — ED Triage Notes (Signed)
Pt from home after mechanical fall. Using walker, went to sit down and fell on her bottom. Pt able to get up from floor, unable to apply weight to left led. C/o left knee pain. Hx of issues with the left knee. Recent gel injections to that knee. Family reports 6 falls in last 5 months.

## 2017-06-19 NOTE — ED Notes (Signed)
Pt approached nurse first with complaints of severe back pain and the family member added that she did hit the back of her head today when she fell and is on coumadin, dr Thomasene Lot notified and vo given

## 2017-06-20 ENCOUNTER — Emergency Department (HOSPITAL_COMMUNITY)
Admission: EM | Admit: 2017-06-20 | Discharge: 2017-06-20 | Disposition: A | Discharge status: Home / Self Care | Payer: Medicare Other | Attending: Emergency Medicine | Admitting: Emergency Medicine

## 2017-06-20 ENCOUNTER — Emergency Department (HOSPITAL_COMMUNITY): Payer: Medicare Other

## 2017-06-20 DIAGNOSIS — M549 Dorsalgia, unspecified: Secondary | ICD-10-CM

## 2017-06-20 DIAGNOSIS — S0990XA Unspecified injury of head, initial encounter: Secondary | ICD-10-CM

## 2017-06-20 DIAGNOSIS — S3992XA Unspecified injury of lower back, initial encounter: Secondary | ICD-10-CM | POA: Diagnosis not present

## 2017-06-20 DIAGNOSIS — W19XXXA Unspecified fall, initial encounter: Secondary | ICD-10-CM

## 2017-06-20 DIAGNOSIS — M25562 Pain in left knee: Secondary | ICD-10-CM

## 2017-06-20 MED ORDER — DOCUSATE SODIUM 100 MG PO CAPS: 100.0000 mg | ORAL_CAPSULE | Freq: Two times a day (BID) | ORAL | 0 refills | Status: AC

## 2017-06-20 MED ORDER — CLONIDINE HCL 0.2 MG PO TABS
0.2000 mg | ORAL_TABLET | Freq: Once | ORAL | Status: AC
  Administered 2017-06-20: 0.2 mg via ORAL
  Filled 2017-06-20: qty 1

## 2017-06-20 MED ORDER — HYDRALAZINE HCL 25 MG PO TABS
50.0000 mg | ORAL_TABLET | Freq: Once | ORAL | Status: AC
  Administered 2017-06-20: 50 mg via ORAL
  Filled 2017-06-20: qty 2

## 2017-06-20 MED ORDER — LABETALOL HCL 200 MG PO TABS
300.0000 mg | ORAL_TABLET | Freq: Once | ORAL | Status: AC
  Administered 2017-06-20: 300 mg via ORAL
  Filled 2017-06-20: qty 2

## 2017-06-20 MED ORDER — OXYCODONE-ACETAMINOPHEN 5-325 MG PO TABS: 1.0000 | ORAL_TABLET | ORAL | 0 refills | Status: DC | PRN

## 2017-06-20 MED ORDER — OXYCODONE-ACETAMINOPHEN 5-325 MG PO TABS
2.0000 | ORAL_TABLET | Freq: Once | ORAL | Status: AC
  Administered 2017-06-20: 2 via ORAL
  Filled 2017-06-20: qty 2

## 2017-06-20 NOTE — ED Notes (Signed)
Patient transported to CT 

## 2017-06-20 NOTE — ED Provider Notes (Signed)
TIME SEEN: 12:43 AM   By signing my name below, I, Collene Leyden, attest that this documentation has been prepared under the direction and in the presence of Britania Shreeve, Delice Bison, DO. Electronically Signed: Collene Leyden, Scribe. 06/20/17. 12:54 AM.  CHIEF COMPLAINT: Fall   HPI:  Ariel Avila is a 83 y.o. female with a history of CHF, CKD, GERD, HTN, PAF on coumadin, and HLD, who presents to the Emergency Department complaining of sudden-onset, constant left knee pain s/p mechanical fall that occurred earlier tonight. Patient states she was ambulating with a walker when she attempted to sit at the kitchen table when her left knee gave out, causing her to fall. States she has chronic left knee pain and this is becoming more frequent where her knee gives out on her. She is being followed by Dr. Stann Mainland with orthopedics for this. Patient was noted to have "gel injections" in the left knee recently. Patient did hit her head, but denies loss of consciousness. Patient is currently on coumadin. Patient reports associated left knee swelling and lower back pain. Patient has applied ice to the area with some relief. Patient was given percocet in triage.patient was ambulatory after the fall. According to family bedside the patient has had 6 falls in the past 5 months that she states are all related to this left knee pain. Patient denies any history of cancer. Patient denies any numbness, weakness, chest pain, abdominal pain, dizziness, shortness of breath, headache, or any additional symptoms.   Patient states her blood pressure is elevated, because she has not yet taken her blood pressure medications tonight due to her injury. Patient states her blood pressure is 189/90 at baseline. Patient usually takes clonidine twice daily, Labatelol BID and Hydralazine TID for blood pressure. She denies any headache, vision changes, chest pain, shortness of breath, numbness, tingling or focal weakness.   ROS: See  HPI Constitutional: no fever  Eyes: no drainage  ENT: no runny nose   Cardiovascular:  no chest pain  Resp: no SOB  GI: no vomiting GU: no dysuria Integumentary: no rash  Allergy: no hives  Musculoskeletal: + left knee pain and swelling Neurological: no slurred speech ROS otherwise negative  PAST MEDICAL HISTORY/PAST SURGICAL HISTORY:  Past Medical History:  Diagnosis Date  . Allergy   . Arthritis    "fingers and back" (06/23/2014)  . Basal cell carcinoma   . CHF (congestive heart failure) ([place])   . Chronic bronchitis ([place])    "lately" (06/23/2014)  . Chronic kidney disease (CKD), stage III (moderate)   . Dysrhythmia   . GERD (gastroesophageal reflux disease)   . H/O hiatal hernia   . Hyperlipidemia   . Hypertension   . Osteoporosis   . Paroxysmal A-fib ([place])   . Type II diabetes mellitus ([facility])     MEDICATIONS:  Prior to Admission medications   Medication Sig Start Date End Date Taking? Authorizing Provider  acetaminophen (TYLENOL) 500 MG tablet Take 500 mg by mouth every 6 (six) hours as needed for moderate pain.    [provider]  amiodarone (PACERONE) 200 MG tablet TAKE 1 TABLET BY MOUTH ONCE DAILY 05/21/17   Belva Crome, MD  cloNIDine (CATAPRES) 0.2 MG tablet Take 0.2 mg by mouth 2 (two) times daily. 0.3 mg am  and 0.2 mg pm    [provider]  desloratadine (CLARINEX) 5 MG tablet Take 5 mg by mouth daily.    [provider]  ferrous sulfate 325 (65 FE)  MG tablet Take 325 mg by mouth 2 (two) times daily.     [provider]  fluticasone (FLONASE) 50 MCG/ACT nasal spray Place 1 spray into both nostrils daily as needed for allergies or rhinitis.     [provider]  furosemide (LASIX) 20 MG tablet Take 40-80 mg by mouth every other day. Alternates between 40 and 80 mg every other day.    [provider]  glimepiride (AMARYL) 1 MG tablet Take 1 tablet (1 mg total) by mouth daily with breakfast. 12/06/14   Barton Dubois, MD  hydrALAZINE (APRESOLINE) 50 MG tablet Take 50 mg by mouth 3 (three) times daily.     [provider]  labetalol (NORMODYNE) 300 MG tablet Take 1 tablet (300 mg total) by mouth 2 (two) times daily. 05/29/17   Burtis Junes, NP  Lifitegrast Shirley Friar) 5 % SOLN Apply 1 drop to eye 2 (two) times daily.    [provider]  lovastatin (MEVACOR) 40 MG tablet Take 1 tablet (40 mg total) by mouth at bedtime. 01/05/14   Belva Crome, MD  Multiple Vitamin (MULTIVITAMIN WITH MINERALS) TABS tablet Take 1 tablet by mouth daily. One a Airline pilot, Historical, MD  Multiple Vitamins-Minerals (EYE VITAMINS PO) Take 1 capsule by mouth daily.    [provider]  Multiple Vitamins-Minerals (HAIR/SKIN/NAILS/BIOTIN) TABS Take 1 tablet by mouth daily.    [provider]  pantoprazole (PROTONIX) 40 MG tablet Take 40 mg by mouth daily.    [provider]  Polyvinyl Alcohol-Povidone (REFRESH OP) Place 1 drop into both eyes 2 (two) times daily as needed (for dry eyes).    [provider]  potassium chloride (K-DUR) 10 MEQ tablet Take 0.5 tablets (5 mEq total) by mouth daily. Patient taking differently: Take 10 mEq by mouth daily.  02/02/16   Nat Math, MD  vitamin A 8000 UNIT capsule Take 8,000 Units by mouth daily.    [provider]  warfarin (COUMADIN) 2 MG tablet Take 1-2 tablets by mouth daily as directed by coumadin clinic Patient taking differently: Take 2-4 mg by mouth daily at 6 PM. 4 mg everyday except on Sunday and Thursday patient takes 2 mg. 05/01/17   Belva Crome, MD    ALLERGIES:  Allergies  Allergen Reactions  . Levaquin [Levofloxacin] Hives, Itching and Other (See Comments)    insomnia  . Sulfa Antibiotics Rash  . Sulfacetamide Sodium Rash    SOCIAL HISTORY:  Social History  Substance Use Topics  . Smoking status: Never Smoker  . Smokeless tobacco: Never Used  . Alcohol use No    FAMILY HISTORY: Family  History  Problem Relation Age of Onset  . Tuberculosis Mother   . Heart disease Father        bad valve  . Heart attack Father   . Heart disease Brother        died at 40  . Heart attack Paternal Grandmother   . Heart attack Brother   . Hypertension Neg Hx   . Stroke Neg Hx     EXAM: BP (!) 179/81 (BP Location: Left Arm)   Pulse 67   Temp 98.9 F (37.2 C) (Oral)   Resp 17   Ht 5\' 4"  (1.626 m)   Wt 138 lb (62.6 kg)   SpO2 94%   BMI 23.69 kg/m  CONSTITUTIONAL: Alert and oriented and responds appropriately to questions. Well-appearing; well-nourished; GCS 109, elderly, extremely pleasant HEAD: Normocephalic;  atraumatic EYES: Conjunctivae clear, PERRL, EOMI ENT: normal nose; no rhinorrhea; moist mucous membranes; pharynx without lesions noted; no dental injury; no septal hematoma NECK: Supple, no meningismus, no LAD; no midline spinal tenderness, step-off or deformity; trachea midline CARD: RRR; S1 and S2 appreciated; no murmurs, no clicks, no rubs, no gallops RESP: Normal chest excursion without splinting or tachypnea; breath sounds clear and equal bilaterally; no wheezes, no rhonchi, no rales; no hypoxia or respiratory distress CHEST:  chest wall stable, no crepitus or ecchymosis or deformity, nontender to palpation; no flail chest ABD/GI: Normal bowel sounds; non-distended; soft, non-tender, no rebound, no guarding; no ecchymosis or other lesions noted PELVIS:  stable, nontender to palpation BACK:  The back appears normal and is tender over the lower lumbar spine to palpation, there is no CVA tenderness; no midline spinal step-off or deformity EXT: Patient is tender to palpation over the left knee without obvious deformity. Full range of motion in his knee. No appreciable ligamentous laxity. 2+ DP pulses bilaterally. Normal ROM in all joints; otherwise extremities are non-tender to palpation; no edema; normal capillary refill; no cyanosis, no bony deformity of patient's extremities,  no joint effusion, compartments are soft, extremities are warm and well-perfused, no ecchymosis SKIN: Normal color for age and race; warm NEURO: Moves all extremities equally, sensation to light touch intact diffusely, cranial nerves II through XII intact, normal speech PSYCH: The patient's mood and manner are appropriate. Grooming and personal hygiene are appropriate.  MEDICAL DECISION MAKING: Patient here with mechanical fall. CT imaging of her head, cervical spine, x-ray of the lumbar spine and left knee showed no acute abnormality. She does have pretty significant tenderness over the lower lumbar spine and her daughter is a surgical nurse and we both feel it would not be unreasonable to obtain a CT. I do not feel she needs an MRI. She is neurologically intact. Reports Percocet in triage did help with her pain. We'll give her a second pill here. She is also hypertensive without symptoms and states this is normal for her. We'll give her her home dose of clonidine, labetalol as well as hydralazine.  ED PROGRESS: Patient reports her pain is improved. Her CT scan shows no acute abnormality. Patient will follow-up with her orthopedic physician for chronic knee pain. We'll discharge with Percocet to take at home. Have advised her to take stool softeners while taking this medication. We did discuss head injury return precautions. Recommended rest, elevation and ice be applied to her left knee. She does use a walker at all times. Her blood pressure has improved with her home medications. Patient and daughter feel comfortable with plan for discharge home. Have discussed return precautions.   At this time, I do not feel there is any life-threatening condition present. I have reviewed and discussed all results (EKG, imaging, lab, urine as appropriate) and exam findings with patient/family. I have reviewed nursing notes and appropriate previous records.  I feel the patient is safe to be discharged home without  further emergent workup and can continue workup as an outpatient as needed. Discussed usual and customary return precautions. Patient/family verbalize understanding and are comfortable with this plan.  Outpatient follow-up has been provided if needed. All questions have been answered.   I personally performed the services described in this documentation, which was scribed in my presence. The recorded information has been reviewed and is accurate.     Ronisha Herringshaw, Delice Bison, DO 06/20/17 [phone]

## 2017-06-21 ENCOUNTER — Ambulatory Visit: Payer: Medicare Other | Admitting: Rehabilitative and Restorative Service Providers"

## 2017-07-01 ENCOUNTER — Ambulatory Visit (INDEPENDENT_AMBULATORY_CARE_PROVIDER_SITE_OTHER): Payer: Medicare Other | Admitting: Pharmacist

## 2017-07-01 DIAGNOSIS — I4891 Unspecified atrial fibrillation: Secondary | ICD-10-CM

## 2017-07-01 DIAGNOSIS — Z5181 Encounter for therapeutic drug level monitoring: Secondary | ICD-10-CM

## 2017-07-01 LAB — POCT INR: INR: 4.7

## 2017-07-04 ENCOUNTER — Encounter: Payer: Self-pay | Admitting: Rehabilitative and Restorative Service Providers"

## 2017-07-04 NOTE — Therapy (Signed)
[facility] [address] [place], [place], [address] Phone: [phone]   Fax:  [phone]  Patient Details  Name: Ariel Avila MRN: [id number] Date of Birth: 04-04-1930 Referring Provider:  Victorino December, MD  Encounter Date: last encounter 05/28/17  PHYSICAL THERAPY DISCHARGE SUMMARY  Visits from Start of Care: 15  Current functional level related to goals / functional outcomes:     PT Short Term Goals - 02/25/17 1257      PT SHORT TERM GOAL #1   Title The patient will be indep in HEP for habituation for horizontal head motion, slow gaze activities, and moving sit>R sidelying to improve tolerance to functional activities.   Baseline Met on 02/22/2017   Time 4   Period Weeks   Status Achieved     PT SHORT TERM GOAL #2   Title The patient will have negative R sidelying and/or R dix hallpike testing indicating resolution of R BPPV.   Baseline Met on 02/25/2017   Time 4   Period Weeks   Status Achieved     PT SHORT TERM GOAL #3   Title The patient will tolerate seated horizontal head turns x 5 reps with no nausea noted.   Baseline Patient continues with mild dizziness, but no nausea at this time (02/22/2017)   Time 4   Period Weeks   Status Achieved     PT SHORT TERM GOAL #4   Title The patient will tolerate rolling supine<>bilateral sidelying without nausea noted and change in dizziness from baseline<2/10.   Baseline Met on 02/25/2017   Time 4   Period Weeks   Status Achieved     PT SHORT TERM GOAL #5   Title The patient will be further assessed on gait speed and balance *needed CGA to leave clinic on eval* and goals to follow, as indicated.   Baseline Gait speed=2.27 ft/sec, Berg=20/56   Time 4   Period Weeks   Status Achieved         PT Long Term Goals - 05/28/17 1001      PT LONG TERM GOAL #1   Title The patient will return demo HEP progression for LE strength, balance and dizziness as indicated.    Baseline REVISED TARGET DATE 06/20/17   Time 4   Period Weeks   Status Achieved     PT LONG TERM GOAL #2   Title The patient will demonstrate stepping up onto 6" curb in order to improve ability to take dogs out and access community surfaces.   Baseline *PT recommended she use rollater RW for safety with community negotiation.    Time 4   Period Weeks   Status Not Met     PT LONG TERM GOAL #3   Title The patient will be further assessed on steps and will return demo step to pattern in order to reduce pressure on left knee.   Baseline Met on 04/23/17   Time 4   Period Weeks   Status Achieved     PT LONG TERM GOAL #4   Title The patient will improve Berg balance score to > or equal to 26/56.   Baseline Patinet scores 32/56.    Time 4   Period Weeks   Status Achieved     PT LONG TERM GOAL #5   Title The patient will be further assessed on grassy surfaces and will verbalize recommendations for safety to reduce falls as she takes dogs out in yard.  Baseline PT recommended using rollater RW for safety when in the community.     Time 4   Period Weeks   Status Deferred        Remaining deficits: SI joint pain, L LE distal anterior tibialis weakness/pain H/o chronic vertigo H/o falls Gait instability   Education / Equipment: Comprehensive HEP.  Plan: Patient agrees to discharge.  Patient goals were partially met. Patient is being discharged due to meeting the stated rehab goals.  ?????         Thank you for the referral of this patient. Rudell Cobb, MPT   [place] 07/04/2017, 9:11 AM  [facility] [address] [place], [place], [address] Phone: [phone]   Fax:  [phone]

## 2017-07-07 ENCOUNTER — Encounter (HOSPITAL_BASED_OUTPATIENT_CLINIC_OR_DEPARTMENT_OTHER): Payer: Self-pay | Admitting: Emergency Medicine

## 2017-07-07 ENCOUNTER — Emergency Department (HOSPITAL_BASED_OUTPATIENT_CLINIC_OR_DEPARTMENT_OTHER)
Admission: EM | Admit: 2017-07-07 | Discharge: 2017-07-07 | Disposition: A | Discharge status: Home / Self Care | Payer: Medicare Other | Attending: Emergency Medicine | Admitting: Emergency Medicine

## 2017-07-07 DIAGNOSIS — L02811 Cutaneous abscess of head [any part, except face]: Secondary | ICD-10-CM | POA: Insufficient documentation

## 2017-07-07 DIAGNOSIS — H5712 Ocular pain, left eye: Secondary | ICD-10-CM | POA: Diagnosis present

## 2017-07-07 DIAGNOSIS — N183 Chronic kidney disease, stage 3 (moderate): Secondary | ICD-10-CM | POA: Insufficient documentation

## 2017-07-07 DIAGNOSIS — I130 Hypertensive heart and chronic kidney disease with heart failure and stage 1 through stage 4 chronic kidney disease, or unspecified chronic kidney disease: Secondary | ICD-10-CM | POA: Diagnosis not present

## 2017-07-07 DIAGNOSIS — Z79899 Other long term (current) drug therapy: Secondary | ICD-10-CM | POA: Diagnosis not present

## 2017-07-07 DIAGNOSIS — Z7984 Long term (current) use of oral hypoglycemic drugs: Secondary | ICD-10-CM | POA: Insufficient documentation

## 2017-07-07 DIAGNOSIS — Z7901 Long term (current) use of anticoagulants: Secondary | ICD-10-CM | POA: Diagnosis not present

## 2017-07-07 DIAGNOSIS — L0291 Cutaneous abscess, unspecified: Secondary | ICD-10-CM

## 2017-07-07 DIAGNOSIS — I2510 Atherosclerotic heart disease of native coronary artery without angina pectoris: Secondary | ICD-10-CM | POA: Insufficient documentation

## 2017-07-07 DIAGNOSIS — I5032 Chronic diastolic (congestive) heart failure: Secondary | ICD-10-CM | POA: Diagnosis not present

## 2017-07-07 DIAGNOSIS — E1122 Type 2 diabetes mellitus with diabetic chronic kidney disease: Secondary | ICD-10-CM | POA: Diagnosis not present

## 2017-07-07 MED ORDER — HYDROCODONE-ACETAMINOPHEN 5-325 MG PO TABS
1.0000 | ORAL_TABLET | Freq: Once | ORAL | Status: AC
  Administered 2017-07-07: 1 via ORAL
  Filled 2017-07-07: qty 1

## 2017-07-07 MED ORDER — CEPHALEXIN 500 MG PO CAPS: 500.0000 mg | ORAL_CAPSULE | Freq: Four times a day (QID) | ORAL | 0 refills | Status: AC

## 2017-07-07 MED ORDER — LIDOCAINE-EPINEPHRINE (PF) 2 %-1:200000 IJ SOLN
10.0000 mL | Freq: Once | INTRAMUSCULAR | Status: AC
  Administered 2017-07-07: 10 mL
  Filled 2017-07-07: qty 10

## 2017-07-07 MED ORDER — CEPHALEXIN 250 MG PO CAPS
500.0000 mg | ORAL_CAPSULE | Freq: Once | ORAL | Status: AC
  Administered 2017-07-07: 500 mg via ORAL
  Filled 2017-07-07: qty 2

## 2017-07-07 MED ORDER — HYDROCODONE-ACETAMINOPHEN 5-325 MG PO TABS: 1.0000 | ORAL_TABLET | ORAL | 0 refills | Status: AC | PRN

## 2017-07-07 NOTE — ED Provider Notes (Signed)
[facility] Provider Note   CSN: [id number] Arrival date & time: 07/07/17  2002 By signing my name below, I, Dyke Brackett, attest that this documentation has been prepared under the direction and in the presence of Isla Pence, MD . Electronically Signed: Dyke Brackett, Scribe. 07/07/2017. 9:44 PM.   History   Chief Complaint Chief Complaint  Patient presents with  . Eye Problem   HPI Comments: Ariel Avila is a 83 y.o. female with a history of CHF, CKD, and DM who presents to the Emergency Department complaining of a moderate, gradually worsening area of pain and swelling to the left scalp onset earlier this week. Pt states pain is exacerbated with palpation and direct pressure. Per daughter, pt was unable to sleep last night due to pain. Pt initially thought this was a pimple and attempted to pop it last night. She reports associated swelling to her left upper eyelid today. No alleviating or modifying factors noted.  No OTC treatments tried for these symptoms PTA.  Pt denies any fevers and has no other associated symptoms or acute complaints at this time.   The history is provided by the patient and a relative. No language interpreter was used.   Past Medical History:  Diagnosis Date  . Allergy   . Arthritis    "fingers and back" (06/23/2014)  . Basal cell carcinoma   . CHF (congestive heart failure) ([place])   . Chronic bronchitis ([place])    "lately" (06/23/2014)  . Chronic kidney disease (CKD), stage III (moderate)   . Dysrhythmia   . GERD (gastroesophageal reflux disease)   . H/O hiatal hernia   . Hyperlipidemia   . Hypertension   . Osteoporosis   . Paroxysmal A-fib ([place])   . Type II diabetes mellitus [facility])     Patient Active Problem List   Diagnosis Date Noted  . Coronary artery calcification seen on CAT scan 07/07/2017  . On amiodarone therapy 02/21/2016  . Nausea vomiting and diarrhea 12/04/2014  . Hypoglycemia 12/03/2014  . CKD (chronic kidney disease)  stage 3, GFR 30-59 ml/min 12/03/2014  . Hiatal hernia 12/03/2014  . GERD (gastroesophageal reflux disease) 12/03/2014  . Bronchitis, acute, with bronchospasm 06/23/2014  . Encounter for therapeutic drug monitoring 04/12/2014  . Paroxysmal atrial fibrillation ([place]) 11/18/2013  . Essential hypertension 11/18/2013  . Hyperlipidemia 11/18/2013  . Diabetes mellitus type 2, uncontrolled, with complications ([place]) 40/98/1191  . Hyperaldosteronism ([place]) 11/18/2013  . Chronic diastolic heart failure ([place]) 11/18/2013    Class: Chronic    Past Surgical History:  Procedure Laterality Date  . ABDOMINAL HYSTERECTOMY    . APPENDECTOMY    . BASAL CELL CARCINOMA EXCISION     "face and nose"  . CARDIOVERSION N/A 10/24/2015   Procedure: CARDIOVERSION;  Surgeon: Pixie Casino, MD;  Location: [facility] ENDOSCOPY;  Service: Cardiovascular;  Laterality: N/A;    OB History    No data available       Home Medications    Prior to Admission medications   Medication Sig Start Date End Date Taking? Authorizing Provider  acetaminophen (TYLENOL) 500 MG tablet Take 500 mg by mouth every 6 (six) hours as needed for moderate pain.    [provider]  amiodarone (PACERONE) 200 MG tablet TAKE 1 TABLET BY MOUTH ONCE DAILY 05/21/17   Belva Crome, MD  cephALEXin (KEFLEX) 500 MG capsule Take 1 capsule (500 mg total) by mouth 4 (four) times daily. 07/07/17   Isla Pence, MD  cloNIDine (CATAPRES)  0.2 MG tablet Take 0.2 mg by mouth 2 (two) times daily. 0.3 mg am  and 0.2 mg pm    [provider]  desloratadine (CLARINEX) 5 MG tablet Take 5 mg by mouth daily.    [provider]  docusate sodium (COLACE) 100 MG capsule Take 1 capsule (100 mg total) by mouth every 12 (twelve) hours. 06/20/17   Ward, Delice Bison, DO  ferrous sulfate 325 (65 FE) MG tablet Take 325 mg by mouth 2 (two) times daily.     [provider]  fluticasone (FLONASE) 50 MCG/ACT nasal spray Place 1 spray into both  nostrils daily as needed for allergies or rhinitis.     [provider]  furosemide (LASIX) 20 MG tablet Take 40-80 mg by mouth every other day. Alternates between 40 and 80 mg every other day.    [provider]  glimepiride (AMARYL) 1 MG tablet Take 1 tablet (1 mg total) by mouth daily with breakfast. 12/06/14   Barton Dubois, MD  hydrALAZINE (APRESOLINE) 50 MG tablet Take 50 mg by mouth 3 (three) times daily.     [provider]  HYDROcodone-acetaminophen (NORCO/VICODIN) 5-325 MG tablet Take 1 tablet by mouth every 4 (four) hours as needed. 07/07/17   Isla Pence, MD  labetalol (NORMODYNE) 300 MG tablet Take 1 tablet (300 mg total) by mouth 2 (two) times daily. 05/29/17   Burtis Junes, NP  Lifitegrast Shirley Friar) 5 % SOLN Apply 1 drop to eye 2 (two) times daily.    [provider]  lovastatin (MEVACOR) 40 MG tablet Take 1 tablet (40 mg total) by mouth at bedtime. 01/05/14   Belva Crome, MD  Multiple Vitamin (MULTIVITAMIN WITH MINERALS) TABS tablet Take 1 tablet by mouth daily. One a Airline pilot, Historical, MD  Multiple Vitamins-Minerals (EYE VITAMINS PO) Take 1 capsule by mouth daily.    [provider]  Multiple Vitamins-Minerals (HAIR/SKIN/NAILS/BIOTIN) TABS Take 1 tablet by mouth daily.    [provider]  oxyCODONE-acetaminophen (PERCOCET/ROXICET) 5-325 MG tablet Take 1 tablet by mouth every 4 (four) hours as needed. 06/20/17   Ward, Delice Bison, DO  pantoprazole (PROTONIX) 40 MG tablet Take 40 mg by mouth daily.    [provider]  Polyvinyl Alcohol-Povidone (REFRESH OP) Place 1 drop into both eyes 2 (two) times daily as needed (for dry eyes).    [provider]  potassium chloride (K-DUR) 10 MEQ tablet Take 0.5 tablets (5 mEq total) by mouth daily. Patient taking differently: Take 10 mEq by mouth daily.  02/02/16   Nat Math, MD  vitamin A 8000 UNIT capsule Take 8,000 Units by mouth daily.    [provider]  warfarin (COUMADIN) 2 MG tablet Take 1-2 tablets by mouth daily as directed by coumadin clinic Patient taking differently: Take 2-4 mg by mouth daily at 6 PM. 4 mg everyday except on Sunday and Thursday patient takes 2 mg. 05/01/17   Belva Crome, MD    Family History Family History  Problem Relation Age of Onset  . Tuberculosis Mother   . Heart disease Father        bad valve  . Heart attack Father   . Heart disease Brother        died at 62  . Heart attack Paternal Grandmother   . Heart attack Brother   . Hypertension Neg Hx   . Stroke Neg Hx     Social History Social History  Substance Use Topics  . Smoking status: Never Smoker  . Smokeless tobacco: Never Used  . Alcohol use No     Allergies   Levaquin [levofloxacin]; Sulfa antibiotics; and Sulfacetamide sodium   Review of Systems Review of Systems All systems reviewed and are negative for acute change except as noted in the HPI.  Physical Exam Updated Vital Signs BP (!) 208/62 (BP Location: Right Arm)   Pulse 68   Temp 98.7 F (37.1 C) (Oral)   Resp 16   Ht 5\' 4"  (1.626 m)   Wt 62.6 kg (138 lb)   SpO2 98%   BMI 23.69 kg/m   Physical Exam  Constitutional: She is oriented to person, place, and time. She appears well-developed and well-nourished. No distress.  HENT:  Head: Normocephalic.  5x5cm abscess to top of head with swelling underneath to forehead and left upper eyelid.   Eyes: Conjunctivae are normal.  Cardiovascular: Normal rate.   Pulmonary/Chest: Effort normal.  Abdominal: She exhibits no distension.  Neurological: She is alert and oriented to person, place, and time.  Skin: Skin is warm and dry.  Nursing note and vitals reviewed.  ED Treatments / Results  DIAGNOSTIC STUDIES:  Oxygen Saturation is 98% on RA, normal by my interpretation.    COORDINATION OF CARE:  9:09 PM Discussed treatment plan with pt at bedside and pt agreed to plan.   Labs (all labs ordered are  listed, but only abnormal results are displayed) Labs Reviewed - No data to display  EKG  EKG Interpretation None       Radiology No results found.  Procedures .Marland KitchenIncision and Drainage Date/Time: 07/07/2017 9:43 PM Performed by: Isla Pence Authorized by: Isla Pence   Consent:    Consent obtained:  Verbal   Consent given by:  Patient   Risks discussed:  Bleeding and incomplete drainage   Alternatives discussed:  No treatment Location:    Type:  Abscess   Size:  5 by 5   Location:  Head   Head location:  Scalp Pre-procedure details:    Skin preparation:  Betadine Anesthesia (see MAR for exact dosages):    Anesthesia method:  Local infiltration   Local anesthetic:  Lidocaine 2% WITH epi Procedure type:    Complexity:  Simple Procedure details:    Incision types:  Single straight   Incision depth:  Dermal   Scalpel blade:  11   Wound management:  Probed and deloculated   Drainage:  Purulent   Drainage amount:  Moderate   Wound treatment:  Wound left open   Packing materials:  None Post-procedure details:    Patient tolerance of procedure:  Tolerated well, no immediate complications   (including critical care time)  Medications Ordered in ED Medications  cephALEXin (KEFLEX) capsule 500 mg (not administered)  HYDROcodone-acetaminophen (NORCO/VICODIN) 5-325 MG per tablet 1 tablet (1 tablet Oral Given 07/07/17 2117)  lidocaine-EPINEPHrine (XYLOCAINE W/EPI) 2 %-1:200000 (PF) injection 10 mL (10 mLs Infiltration Given by Other 07/07/17 2117)     Initial Impression / Assessment and Plan / ED Course  I have reviewed the triage vital signs and the nursing notes.  Pertinent labs & imaging results that were available during my care of the patient were reviewed by me and considered in my medical decision making (see chart for details).   Pt tolerated procedure well.  She will be started on keflex.  She knows to return if worse.    Final Clinical Impressions(s) /  ED Diagnoses  Final diagnoses:  Abscess    New Prescriptions New Prescriptions   CEPHALEXIN (KEFLEX) 500 MG CAPSULE    Take 1 capsule (500 mg total) by mouth 4 (four) times daily.   HYDROCODONE-ACETAMINOPHEN (NORCO/VICODIN) 5-325 MG TABLET    Take 1 tablet by mouth every 4 (four) hours as needed.   I personally performed the services described in this documentation, which was scribed in my presence. The recorded information has been reviewed and is accurate.    Isla Pence, MD 07/07/17 2145

## 2017-07-07 NOTE — Progress Notes (Signed)
Cardiology Office Note    Date:  07/08/2017   ID:  Ariel Avila, DOB [birth year], MRN [id number]  PCP:  Vernie Shanks, MD  Cardiologist: Sinclair Grooms, MD   Chief Complaint  Patient presents with  . Atrial Fibrillation  . Edema    History of Present Illness:  Ariel Avila is a 83 y.o. female chronic diastolic heart failure, paroxysmal atrial fibrillation on chronic amiodarone therapy, hypertension, chronic kidney disease III, hyperlipidemia, and CAD (diffuse calcification on past CT).    There are no specific cardiac complaints. She has not had chest pain, orthopnea, or PND. Increasing furosemide 80 mg a day for 4 days slightly improved lower extremity swelling. Diltiazem was discontinued. On the last office visit, BNP was 1817. She was never short of breath doing this time. Perhaps lower extremity swelling improved. She denies chest pain.  Her husband died late last week. This is unfortunate but overall may be an improvement for her as she was chronically sleep deprived providing personal care for him 24/7.  She has become much more sedentary. She has difficulty with balance and this had falls.  Past Medical History:  Diagnosis Date  . Allergy   . Arthritis    "fingers and back" (06/23/2014)  . Basal cell carcinoma   . CHF (congestive heart failure) ([place])   . Chronic bronchitis ([place])    "lately" (06/23/2014)  . Chronic kidney disease (CKD), stage III (moderate)   . Dysrhythmia   . GERD (gastroesophageal reflux disease)   . H/O hiatal hernia   . Hyperlipidemia   . Hypertension   . Osteoporosis   . Paroxysmal A-fib ([place])   . Type II diabetes mellitus ([place])     Past Surgical History:  Procedure Laterality Date  . ABDOMINAL HYSTERECTOMY    . APPENDECTOMY    . BASAL CELL CARCINOMA EXCISION     "face and nose"  . CARDIOVERSION Ariel Avila 10/24/2015   Procedure: CARDIOVERSION;  Surgeon: Pixie Casino, MD;  Location: [facility] ENDOSCOPY;  Service: Cardiovascular;  Laterality:  Ariel Avila;    Current Medications: Outpatient Medications Prior to Visit  Medication Sig Dispense Refill  . acetaminophen (TYLENOL) 500 MG tablet Take 500 mg by mouth every 6 (six) hours as needed for moderate pain.    Marland Kitchen amiodarone (PACERONE) 200 MG tablet TAKE 1 TABLET BY MOUTH ONCE DAILY 30 tablet 1  . cephALEXin (KEFLEX) 500 MG capsule Take 1 capsule (500 mg total) by mouth 4 (four) times daily. 28 capsule 0  . cloNIDine (CATAPRES) 0.2 MG tablet Take 0.2 mg by mouth 2 (two) times daily. 0.3 mg am  and 0.2 mg pm    . desloratadine (CLARINEX) 5 MG tablet Take 5 mg by mouth daily.    Marland Kitchen docusate sodium (COLACE) 100 MG capsule Take 1 capsule (100 mg total) by mouth every 12 (twelve) hours. 60 capsule 0  . ferrous sulfate 325 (65 FE) MG tablet Take 325 mg by mouth 2 (two) times daily.     . fluticasone (FLONASE) 50 MCG/ACT nasal spray Place 1 spray into both nostrils daily as needed for allergies or rhinitis.     . furosemide (LASIX) 20 MG tablet Take 40-80 mg by mouth every other day. Alternates between 40 and 80 mg every other day.    Marland Kitchen glimepiride (AMARYL) 1 MG tablet Take 1 tablet (1 mg total) by mouth daily with breakfast. 30 tablet 1  . hydrALAZINE (APRESOLINE) 50 MG tablet Take 50 mg by mouth  3 (three) times daily.     Marland Kitchen HYDROcodone-acetaminophen (NORCO/VICODIN) 5-325 MG tablet Take 1 tablet by mouth every 4 (four) hours as needed. 10 tablet 0  . labetalol (NORMODYNE) 300 MG tablet Take 1 tablet (300 mg total) by mouth 2 (two) times daily. 180 tablet 3  . Lifitegrast (XIIDRA) 5 % SOLN Apply 1 drop to eye 2 (two) times daily.    Marland Kitchen lovastatin (MEVACOR) 40 MG tablet Take 1 tablet (40 mg total) by mouth at bedtime. 30 tablet 10  . Multiple Vitamin (MULTIVITAMIN WITH MINERALS) TABS tablet Take 1 tablet by mouth daily. One a Day Women's    . Multiple Vitamins-Minerals (EYE VITAMINS PO) Take 1 capsule by mouth daily.    . Multiple Vitamins-Minerals (HAIR/SKIN/NAILS/BIOTIN) TABS Take 1 tablet by mouth  daily.    . pantoprazole (PROTONIX) 40 MG tablet Take 40 mg by mouth daily.    . Polyvinyl Alcohol-Povidone (REFRESH OP) Place 1 drop into both eyes 2 (two) times daily as needed (for dry eyes).    . vitamin A 8000 UNIT capsule Take 8,000 Units by mouth daily.    Marland Kitchen warfarin (COUMADIN) 2 MG tablet Take 1-2 tablets by mouth daily as directed by coumadin clinic (Patient taking differently: Take 2-4 mg by mouth daily at 6 PM. 4 mg everyday except on Sunday and Thursday patient takes 2 mg.) 50 tablet 2  . oxyCODONE-acetaminophen (PERCOCET/ROXICET) 5-325 MG tablet Take 1 tablet by mouth every 4 (four) hours as needed. (Patient not taking: Reported on 07/08/2017) 20 tablet 0  . potassium chloride (K-DUR) 10 MEQ tablet Take 0.5 tablets (5 mEq total) by mouth daily. (Patient not taking: Reported on 07/08/2017) 30 tablet 0   No facility-administered medications prior to visit.      Allergies:   Levaquin [levofloxacin]; Sulfa antibiotics; and Sulfacetamide sodium   Social History   Social History  . Marital status: Married    Spouse name: Ariel Avila  . Number of children: Ariel Avila  . Years of education: Ariel Avila   Occupational History  . Retired    Social History Main Topics  . Smoking status: Never Smoker  . Smokeless tobacco: Never Used  . Alcohol use No  . Drug use: No  . Sexual activity: No   Other Topics Concern  . None   Social History Narrative   Lives in [place]. Daughter works in [employer] at [employer].      Family History:  The patient's family history includes Heart attack in her brother, father, and paternal grandmother; Heart disease in her brother and father; Tuberculosis in her mother.   ROS:   Please see the history of present illness.    Poor balance, difficulty ambulating, recent abscess on her scalp that required incision and drainage. Poor appetite.  All other systems reviewed and are negative.   PHYSICAL EXAM:   VS:  BP (!) 160/60 (BP Location: Right Arm)   Pulse 62   Ht 5\' 4"   (1.626 m)   Wt 136 lb (61.7 kg)   BMI 23.34 kg/m    GEN: Well nourished, well developed, in no acute distress  HEENT: normal  Neck: no JVD, carotid bruits, or masses Cardiac: RRR; no murmurs, rubs, or gallops. 2+ ankle edema bilaterally improved compared to May visit. The slight increase in furosemide on the last visit did not make much of a difference. Respiratory:  clear to auscultation bilaterally, normal work of breathing GI: soft, nontender, nondistended, + BS MS: no deformity or atrophy  Skin: warm  and dry, no rash Neuro:  Alert and Oriented x 3, Strength and sensation are intact Psych: euthymic mood, full affect  Wt Readings from Last 3 Encounters:  07/08/17 136 lb (61.7 kg)  07/07/17 138 lb (62.6 kg)  06/19/17 138 lb (62.6 kg)      Studies/Labs Reviewed:   EKG:  EKG  Not repeated  Recent Labs: 05/29/2017: ALT 38; BUN 27; Creatinine, Ser 2.06; Hemoglobin 11.7; NT-Pro BNP 1,817; Platelets 197; Potassium 4.2; Sodium 139; TSH 1.630   Lipid Panel No results found for: CHOL, TRIG, HDL, CHOLHDL, VLDL, LDLCALC, LDLDIRECT  Additional studies/ records that were reviewed today include:  Most recent baseline creatinine is 2.07  Pro BNP 1817    ASSESSMENT:    1. Chronic diastolic heart failure (HCC)   2. Paroxysmal atrial fibrillation ([place])   3. Essential hypertension   4. Uncontrolled type 2 diabetes mellitus with complication, without long-term current use of insulin ([place])   5. CKD (chronic kidney disease) stage 3, GFR 30-59 ml/min   6. Other hyperlipidemia   7. On amiodarone therapy   8. Encounter for therapeutic drug monitoring   9. Coronary artery calcification seen on CAT scan      PLAN:  In order of problems listed above:  1. She appears to be euvolemic except for lower extremity swelling. She may require compression stockings. Would not resume higher dose Lasix at this time given chronic kidney disease. If dyspnea develops it would be appropriate to use a  more intense diuretic dose. 2. Currently in sinus rhythm based upon exam. Clinical follow-up in 6 months. Amiodarone is suppressing A. Fib. 3. Blood pressure is 160/60 mmHg. This slight increase is related to discontinuation of diltiazem. Given a history of hypertension and CK, will observe and not further change the blood pressure regimen. 4. Not addressed 5. Not addressed 6. Not addressed 7. Maintaining normal sinus rhythm based on exam on amiodarone. TSH and a panic, were normal in May. Repeat these blood tests in 6 months. 8. Continue follow-up in Coumadin clinic   Overall, clinical observation. They will notify if lower extremity swelling worsens or shortness of breath develops. Continue diuretic regimen had 80 mg alternated with 40 mg. Clinical follow-up in 6 months with TSH and hepatic panel. 9. Liver panel and TSH were normal in May. These will be repeated on return in 6 months. EKG will be done at that time as well.    Medication Adjustments/Labs and Tests Ordered: Current medicines are reviewed at length with the patient today.  Concerns regarding medicines are outlined above.  Medication changes, Labs and Tests ordered today are listed in the Patient Instructions below. Patient Instructions  Medication Instructions:  None  Labwork: None  Testing/Procedures: None  Follow-Up: Your physician wants you to follow-up in: 6 months with Dr. Tamala Julian.  You will receive a reminder letter in the mail two months in advance. If you don't receive a letter, please call our office to schedule the follow-up appointment.   Any Other Special Instructions Will Be Listed Below (If Applicable).     If you need a refill on your cardiac medications before your next appointment, please call your pharmacy.      Signed, Sinclair Grooms, MD  07/08/2017 11:13 AM    [facility] [address], [place], [place]  [address] Phone: [phone]; Fax: [phone]

## 2017-07-07 NOTE — ED Triage Notes (Signed)
Patient states that she has a bump to her left head earlier this week. The patient states that last night it popped. Today it started to cause swelling down into her left

## 2017-07-08 ENCOUNTER — Ambulatory Visit (INDEPENDENT_AMBULATORY_CARE_PROVIDER_SITE_OTHER): Payer: Medicare Other | Admitting: Interventional Cardiology

## 2017-07-08 ENCOUNTER — Encounter: Payer: Self-pay | Admitting: Interventional Cardiology

## 2017-07-08 VITALS — BP 160/60 | HR 62 | Ht 64.0 in | Wt 136.0 lb

## 2017-07-08 DIAGNOSIS — N183 Chronic kidney disease, stage 3 unspecified: Secondary | ICD-10-CM

## 2017-07-08 DIAGNOSIS — I2510 Atherosclerotic heart disease of native coronary artery without angina pectoris: Secondary | ICD-10-CM

## 2017-07-08 DIAGNOSIS — E784 Other hyperlipidemia: Secondary | ICD-10-CM | POA: Diagnosis not present

## 2017-07-08 DIAGNOSIS — E1165 Type 2 diabetes mellitus with hyperglycemia: Secondary | ICD-10-CM | POA: Diagnosis not present

## 2017-07-08 DIAGNOSIS — Z79899 Other long term (current) drug therapy: Secondary | ICD-10-CM

## 2017-07-08 DIAGNOSIS — I5032 Chronic diastolic (congestive) heart failure: Secondary | ICD-10-CM

## 2017-07-08 DIAGNOSIS — E7849 Other hyperlipidemia: Secondary | ICD-10-CM

## 2017-07-08 DIAGNOSIS — E118 Type 2 diabetes mellitus with unspecified complications: Secondary | ICD-10-CM | POA: Diagnosis not present

## 2017-07-08 DIAGNOSIS — I480 Paroxysmal atrial fibrillation: Secondary | ICD-10-CM

## 2017-07-08 DIAGNOSIS — IMO0002 Reserved for concepts with insufficient information to code with codable children: Secondary | ICD-10-CM

## 2017-07-08 DIAGNOSIS — Z5181 Encounter for therapeutic drug level monitoring: Secondary | ICD-10-CM

## 2017-07-08 DIAGNOSIS — I10 Essential (primary) hypertension: Secondary | ICD-10-CM | POA: Diagnosis not present

## 2017-07-08 NOTE — Patient Instructions (Signed)
Medication Instructions:  None  Labwork: None  Testing/Procedures: None  Follow-Up: Your physician wants you to follow-up in: 6 months with Dr. Smith.  You will receive a reminder letter in the mail two months in advance. If you don't receive a letter, please call our office to schedule the follow-up appointment.   Any Other Special Instructions Will Be Listed Below (If Applicable).     If you need a refill on your cardiac medications before your next appointment, please call your pharmacy.   

## 2017-07-10 DIAGNOSIS — M5416 Radiculopathy, lumbar region: Secondary | ICD-10-CM | POA: Diagnosis not present

## 2017-07-12 DIAGNOSIS — R748 Abnormal levels of other serum enzymes: Secondary | ICD-10-CM | POA: Diagnosis not present

## 2017-07-12 DIAGNOSIS — Z7901 Long term (current) use of anticoagulants: Secondary | ICD-10-CM | POA: Diagnosis not present

## 2017-07-12 DIAGNOSIS — Z7984 Long term (current) use of oral hypoglycemic drugs: Secondary | ICD-10-CM | POA: Diagnosis not present

## 2017-07-12 DIAGNOSIS — E785 Hyperlipidemia, unspecified: Secondary | ICD-10-CM | POA: Diagnosis not present

## 2017-07-12 DIAGNOSIS — R600 Localized edema: Secondary | ICD-10-CM | POA: Diagnosis not present

## 2017-07-12 DIAGNOSIS — I10 Essential (primary) hypertension: Secondary | ICD-10-CM | POA: Diagnosis not present

## 2017-07-12 DIAGNOSIS — E1142 Type 2 diabetes mellitus with diabetic polyneuropathy: Secondary | ICD-10-CM | POA: Diagnosis not present

## 2017-07-12 DIAGNOSIS — N189 Chronic kidney disease, unspecified: Secondary | ICD-10-CM | POA: Diagnosis not present

## 2017-07-16 ENCOUNTER — Ambulatory Visit (INDEPENDENT_AMBULATORY_CARE_PROVIDER_SITE_OTHER): Payer: Medicare Other | Admitting: Pharmacist

## 2017-07-16 DIAGNOSIS — I480 Paroxysmal atrial fibrillation: Secondary | ICD-10-CM | POA: Diagnosis not present

## 2017-07-16 DIAGNOSIS — Z5181 Encounter for therapeutic drug level monitoring: Secondary | ICD-10-CM

## 2017-07-16 DIAGNOSIS — I2510 Atherosclerotic heart disease of native coronary artery without angina pectoris: Secondary | ICD-10-CM | POA: Diagnosis not present

## 2017-07-16 DIAGNOSIS — I4891 Unspecified atrial fibrillation: Secondary | ICD-10-CM | POA: Diagnosis not present

## 2017-07-16 LAB — POCT INR: INR: 2.6

## 2017-07-17 DIAGNOSIS — M5416 Radiculopathy, lumbar region: Secondary | ICD-10-CM | POA: Diagnosis not present

## 2017-07-22 ENCOUNTER — Other Ambulatory Visit: Payer: Self-pay | Admitting: Interventional Cardiology

## 2017-07-22 ENCOUNTER — Telehealth: Payer: Self-pay

## 2017-07-22 NOTE — Telephone Encounter (Signed)
I have done an Amiodarone PA through covermymeds. Awaiting response.

## 2017-07-23 NOTE — Telephone Encounter (Signed)
Amiodarone PA has been approved through [facility]. Approval good until 07/22/18.

## 2017-08-04 ENCOUNTER — Other Ambulatory Visit: Payer: Self-pay | Admitting: Interventional Cardiology

## 2017-08-13 ENCOUNTER — Ambulatory Visit (INDEPENDENT_AMBULATORY_CARE_PROVIDER_SITE_OTHER): Payer: Medicare Other | Admitting: Pharmacist Clinician (PhC)/ Clinical Pharmacy Specialist

## 2017-08-13 DIAGNOSIS — I480 Paroxysmal atrial fibrillation: Secondary | ICD-10-CM

## 2017-08-13 DIAGNOSIS — I4891 Unspecified atrial fibrillation: Secondary | ICD-10-CM

## 2017-08-13 DIAGNOSIS — Z5181 Encounter for therapeutic drug level monitoring: Secondary | ICD-10-CM

## 2017-08-13 DIAGNOSIS — I2510 Atherosclerotic heart disease of native coronary artery without angina pectoris: Secondary | ICD-10-CM | POA: Diagnosis not present

## 2017-08-13 LAB — POCT INR: INR: 3.8

## 2017-08-15 ENCOUNTER — Emergency Department (HOSPITAL_COMMUNITY): Payer: Medicare Other

## 2017-08-15 ENCOUNTER — Inpatient Hospital Stay (HOSPITAL_COMMUNITY)
Admission: EM | Admit: 2017-08-15 | Discharge: 2017-08-31 | DRG: 064 | Disposition: E | Payer: Medicare Other | Attending: Family Medicine | Admitting: Family Medicine

## 2017-08-15 ENCOUNTER — Encounter (HOSPITAL_COMMUNITY): Payer: Self-pay | Admitting: Emergency Medicine

## 2017-08-15 DIAGNOSIS — I129 Hypertensive chronic kidney disease with stage 1 through stage 4 chronic kidney disease, or unspecified chronic kidney disease: Secondary | ICD-10-CM | POA: Diagnosis present

## 2017-08-15 DIAGNOSIS — Z7189 Other specified counseling: Secondary | ICD-10-CM | POA: Insufficient documentation

## 2017-08-15 DIAGNOSIS — I2510 Atherosclerotic heart disease of native coronary artery without angina pectoris: Secondary | ICD-10-CM | POA: Diagnosis present

## 2017-08-15 DIAGNOSIS — Z85828 Personal history of other malignant neoplasm of skin: Secondary | ICD-10-CM

## 2017-08-15 DIAGNOSIS — I471 Supraventricular tachycardia: Secondary | ICD-10-CM | POA: Diagnosis not present

## 2017-08-15 DIAGNOSIS — I499 Cardiac arrhythmia, unspecified: Secondary | ICD-10-CM | POA: Diagnosis not present

## 2017-08-15 DIAGNOSIS — R402432 Glasgow coma scale score 3-8, at arrival to emergency department: Secondary | ICD-10-CM | POA: Diagnosis present

## 2017-08-15 DIAGNOSIS — I619 Nontraumatic intracerebral hemorrhage, unspecified: Secondary | ICD-10-CM | POA: Diagnosis not present

## 2017-08-15 DIAGNOSIS — E1122 Type 2 diabetes mellitus with diabetic chronic kidney disease: Secondary | ICD-10-CM | POA: Diagnosis present

## 2017-08-15 DIAGNOSIS — Z9049 Acquired absence of other specified parts of digestive tract: Secondary | ICD-10-CM | POA: Diagnosis not present

## 2017-08-15 DIAGNOSIS — G936 Cerebral edema: Secondary | ICD-10-CM | POA: Diagnosis present

## 2017-08-15 DIAGNOSIS — K219 Gastro-esophageal reflux disease without esophagitis: Secondary | ICD-10-CM | POA: Diagnosis present

## 2017-08-15 DIAGNOSIS — Z515 Encounter for palliative care: Secondary | ICD-10-CM | POA: Insufficient documentation

## 2017-08-15 DIAGNOSIS — Z90710 Acquired absence of both cervix and uterus: Secondary | ICD-10-CM | POA: Diagnosis not present

## 2017-08-15 DIAGNOSIS — I509 Heart failure, unspecified: Secondary | ICD-10-CM | POA: Diagnosis not present

## 2017-08-15 DIAGNOSIS — Z66 Do not resuscitate: Secondary | ICD-10-CM | POA: Diagnosis present

## 2017-08-15 DIAGNOSIS — I4891 Unspecified atrial fibrillation: Secondary | ICD-10-CM | POA: Diagnosis present

## 2017-08-15 DIAGNOSIS — I618 Other nontraumatic intracerebral hemorrhage: Secondary | ICD-10-CM

## 2017-08-15 DIAGNOSIS — R4182 Altered mental status, unspecified: Secondary | ICD-10-CM | POA: Diagnosis not present

## 2017-08-15 DIAGNOSIS — Z7951 Long term (current) use of inhaled steroids: Secondary | ICD-10-CM | POA: Diagnosis not present

## 2017-08-15 DIAGNOSIS — Z79899 Other long term (current) drug therapy: Secondary | ICD-10-CM

## 2017-08-15 DIAGNOSIS — R4189 Other symptoms and signs involving cognitive functions and awareness: Secondary | ICD-10-CM

## 2017-08-15 DIAGNOSIS — Z8249 Family history of ischemic heart disease and other diseases of the circulatory system: Secondary | ICD-10-CM

## 2017-08-15 DIAGNOSIS — Z7901 Long term (current) use of anticoagulants: Secondary | ICD-10-CM | POA: Diagnosis not present

## 2017-08-15 DIAGNOSIS — R55 Syncope and collapse: Secondary | ICD-10-CM | POA: Diagnosis not present

## 2017-08-15 DIAGNOSIS — N183 Chronic kidney disease, stage 3 (moderate): Secondary | ICD-10-CM | POA: Diagnosis present

## 2017-08-15 DIAGNOSIS — Z882 Allergy status to sulfonamides status: Secondary | ICD-10-CM | POA: Diagnosis not present

## 2017-08-15 DIAGNOSIS — I629 Nontraumatic intracranial hemorrhage, unspecified: Secondary | ICD-10-CM | POA: Diagnosis not present

## 2017-08-15 DIAGNOSIS — Z888 Allergy status to other drugs, medicaments and biological substances status: Secondary | ICD-10-CM

## 2017-08-15 LAB — COMPREHENSIVE METABOLIC PANEL
ALBUMIN: 2.9 g/dL — AB (ref 3.5–5.0)
ALT: 36 U/L (ref 14–54)
ANION GAP: 10 (ref 5–15)
AST: 69 U/L — ABNORMAL HIGH (ref 15–41)
Alkaline Phosphatase: 132 U/L — ABNORMAL HIGH (ref 38–126)
BILIRUBIN TOTAL: 0.9 mg/dL (ref 0.3–1.2)
BUN: 24 mg/dL — ABNORMAL HIGH (ref 6–20)
CO2: 24 mmol/L (ref 22–32)
Calcium: 8.4 mg/dL — ABNORMAL LOW (ref 8.9–10.3)
Chloride: 104 mmol/L (ref 101–111)
Creatinine, Ser: 1.93 mg/dL — ABNORMAL HIGH (ref 0.44–1.00)
GFR calc non Af Amer: 23 mL/min — ABNORMAL LOW (ref >60)
GFR, EST AFRICAN AMERICAN: 26 mL/min — AB (ref >60)
GLUCOSE: 193 mg/dL — AB (ref 65–99)
POTASSIUM: 3.8 mmol/L (ref 3.5–5.1)
Sodium: 138 mmol/L (ref 135–145)
TOTAL PROTEIN: 5.6 g/dL — AB (ref 6.5–8.1)

## 2017-08-15 LAB — CBC WITH DIFFERENTIAL/PLATELET
BASOS PCT: 0 %
Basophils Absolute: 0 10*3/uL (ref 0.0–0.1)
EOS ABS: 0.1 10*3/uL (ref 0.0–0.7)
Eosinophils Relative: 1 %
HEMATOCRIT: 34.1 % — AB (ref 36.0–46.0)
Hemoglobin: 11.3 g/dL — ABNORMAL LOW (ref 12.0–15.0)
LYMPHS PCT: 19 %
Lymphs Abs: 1.4 10*3/uL (ref 0.7–4.0)
MCH: 28.7 pg (ref 26.0–34.0)
MCHC: 33.1 g/dL (ref 30.0–36.0)
MCV: 86.5 fL (ref 78.0–100.0)
MONO ABS: 0.4 10*3/uL (ref 0.1–1.0)
MONOS PCT: 6 %
Neutro Abs: 5.4 10*3/uL (ref 1.7–7.7)
Neutrophils Relative %: 74 %
Platelets: 207 10*3/uL (ref 150–400)
RBC: 3.94 MIL/uL (ref 3.87–5.11)
RDW: 14 % (ref 11.5–15.5)
WBC: 7.3 10*3/uL (ref 4.0–10.5)

## 2017-08-15 LAB — I-STAT CHEM 8, ED
BUN: 27 mg/dL — ABNORMAL HIGH (ref 6–20)
Calcium, Ion: 1.07 mmol/L — ABNORMAL LOW (ref 1.15–1.40)
Chloride: 102 mmol/L (ref 101–111)
Creatinine, Ser: 1.8 mg/dL — ABNORMAL HIGH (ref 0.44–1.00)
Glucose, Bld: 193 mg/dL — ABNORMAL HIGH (ref 65–99)
HEMATOCRIT: 34 % — AB (ref 36.0–46.0)
HEMOGLOBIN: 11.6 g/dL — AB (ref 12.0–15.0)
Potassium: 3.8 mmol/L (ref 3.5–5.1)
SODIUM: 139 mmol/L (ref 135–145)
TCO2: 26 mmol/L (ref 0–100)

## 2017-08-15 LAB — PROTIME-INR
INR: 2.54
Prothrombin Time: 27.8 seconds — ABNORMAL HIGH (ref 11.4–15.2)

## 2017-08-15 LAB — I-STAT TROPONIN, ED: Troponin i, poc: 0.01 ng/mL (ref 0.00–0.08)

## 2017-08-15 LAB — I-STAT CG4 LACTIC ACID, ED: LACTIC ACID, VENOUS: 0.78 mmol/L (ref 0.5–1.9)

## 2017-08-15 MED ORDER — SODIUM CHLORIDE 0.9 % IV SOLN
2.0000 mg/h | INTRAVENOUS | Status: DC
  Administered 2017-08-15: 2 mg/h via INTRAVENOUS
  Filled 2017-08-15: qty 10

## 2017-08-15 MED ORDER — CLEVIDIPINE BUTYRATE 0.5 MG/ML IV EMUL
INTRAVENOUS | Status: AC
Start: 2017-08-15 — End: 2017-08-15
  Filled 2017-08-15: qty 50

## 2017-08-15 MED ORDER — CLEVIDIPINE BUTYRATE 0.5 MG/ML IV EMUL
0.0000 mg/h | INTRAVENOUS | Status: DC
  Administered 2017-08-15: 1 mg/h via INTRAVENOUS

## 2017-08-15 MED ORDER — ROCURONIUM BROMIDE 50 MG/5ML IV SOLN
INTRAVENOUS | Status: AC | PRN
  Administered 2017-08-15: 100 mg via INTRAVENOUS

## 2017-08-15 MED ORDER — GLYCOPYRROLATE 0.2 MG/ML IJ SOLN: 0.2000 mg | Freq: Once | INTRAMUSCULAR | Status: DC

## 2017-08-15 MED ORDER — GLYCOPYRROLATE 0.2 MG/ML IJ SOLN: 0.2000 mg | INTRAMUSCULAR | Status: DC | PRN

## 2017-08-15 MED ORDER — MORPHINE BOLUS VIA INFUSION
4.0000 mg | INTRAVENOUS | Status: DC | PRN
  Filled 2017-08-15: qty 4

## 2017-08-15 MED ORDER — BIOTENE DRY MOUTH MT LIQD: 15.0000 mL | OROMUCOSAL | Status: DC | PRN

## 2017-08-15 MED ORDER — LORAZEPAM 2 MG/ML IJ SOLN: 1.0000 mg | INTRAMUSCULAR | Status: DC | PRN

## 2017-08-15 MED ORDER — GLYCOPYRROLATE 0.2 MG/ML IJ SOLN
0.2000 mg | Freq: Once | INTRAMUSCULAR | Status: AC
  Administered 2017-08-15: 0.2 mg via INTRAVENOUS
  Filled 2017-08-15: qty 1

## 2017-08-15 MED ORDER — ETOMIDATE 2 MG/ML IV SOLN
INTRAVENOUS | Status: AC | PRN
  Administered 2017-08-15: 30 mg via INTRAVENOUS

## 2017-08-15 MED ORDER — HALOPERIDOL LACTATE 5 MG/ML IJ SOLN: 0.5000 mg | INTRAMUSCULAR | Status: DC | PRN

## 2017-08-15 MED ORDER — ACETAMINOPHEN 650 MG RE SUPP: 650.0000 mg | Freq: Four times a day (QID) | RECTAL | Status: DC | PRN

## 2017-08-15 MED ORDER — SODIUM CHLORIDE 0.9 % IV SOLN
2.0000 mg/h | INTRAVENOUS | Status: DC
  Filled 2017-08-15: qty 10

## 2017-08-15 MED ORDER — POLYVINYL ALCOHOL 1.4 % OP SOLN
1.0000 [drp] | Freq: Four times a day (QID) | OPHTHALMIC | Status: DC | PRN
  Filled 2017-08-15: qty 15

## 2017-08-15 MED ORDER — ONDANSETRON HCL 4 MG/2ML IJ SOLN: 4.0000 mg | INTRAMUSCULAR | Status: DC | PRN

## 2017-08-15 MED ORDER — MORPHINE SULFATE (PF) 4 MG/ML IV SOLN
2.0000 mg | Freq: Once | INTRAVENOUS | Status: AC
  Administered 2017-08-15: 2 mg via INTRAVENOUS

## 2017-08-15 MED ORDER — CLEVIDIPINE BUTYRATE 0.5 MG/ML IV EMUL
0.0000 mg/h | INTRAVENOUS | Status: DC
  Administered 2017-08-15: 16 mg/h via INTRAVENOUS

## 2017-08-15 MED ORDER — LORAZEPAM 2 MG/ML IJ SOLN
1.0000 mg | Freq: Once | INTRAMUSCULAR | Status: AC
  Administered 2017-08-15: 1 mg via INTRAVENOUS
  Filled 2017-08-15: qty 1

## 2017-08-31 DIAGNOSIS — 419620001 Death: Secondary | SNOMED CT

## 2017-08-31 NOTE — Progress Notes (Signed)
CSW spoke with family in which family expressed that they are doing well and do not need anything at this time. CSW was informed by doctor that pt a Art therapist will be over to the ED soon to assist with the paperwork to [facility]. CSW will continue to assists with needs and support family as presented.    Virgie Dad Darran Gabay, MSW, [facility] Clinical Social Worker [phone]

## 2017-08-31 NOTE — ED Notes (Signed)
MD preparing to intubate

## 2017-08-31 NOTE — Discharge Planning (Signed)
Pt to dc to [facility] from ED today.

## 2017-08-31 NOTE — Consult Note (Signed)
Consultation Note Date: 09/13/2017   Patient Name: Ariel Avila  DOB: 26-Mar-1930  MRN: [id number]  Age / Sex: 83 y.o., female  PCP: Vernie Shanks, MD Referring Physician: Tegeler, Gwenyth Allegra, *  Reason for Consultation: Establishing goals of care, Psychosocial/spiritual support and Terminal Care  HPI/Patient Profile: 83 y.o. female  with past medical history of dCHF, CAD, CKD stage 3, A. Fib on amiodorone and coumadin, DM2, and HTN. She now presents from home after being found unresponsive. On arrival she was intubated to allow for head CT. Imaging revealed a large intracerebral hemorrhage with vasogenic edema and 88mm right to left midline shift. Family updated and given the grave prognosis. In alignment with her previously expressed wishes, family would like to transition to full comfort measures and one-way extubate. Palliative consulted to assist in symptom management and EOL care.    Clinical Assessment and Goals of Care: Ariel Avila is unresponsive with non-reactive pupils. Extensive family has now presented to her bedside and are requesting extubation as soon as possible. They continue to express the goal of focusing on comfort, and recognize she is at the end of her life. They are hopeful she can go to [facility] or possibly [facility]. Of note, no beds at [facility] presently. I shared the plan of extubating then assessing stability and tolerance of transitioning out of the hospital. I went through the extubation process, as well as the plan for symptom management during and post extubation.  ADDENDUM: Pt extubated 1053. Post extubation she still had a strong heartbeat but was not breathing. Family updated and plan to watch and wait explained. Pt expired with death pronounced at 49.   Primary Decision Maker NEXT OF KIN   SUMMARY OF RECOMMENDATIONS    DNR, full comfort care with plan for one way extubation   Extubation and comfort  orders placed   SW consult in to investigate residential hospice availability in case pt stable for transport after extubation  ADDENDUM: Pt died post extubation.   Code Status/Advance Care Planning:  DNR  Symptom Management:  Appears comfortable at present. Respiratory distress on admission.  Pain: Morphine infusion and PRN boluses Anxiety: Ativan, Haldol N/V: Zofran Secretions: Robinul  Palliative Prophylaxis:   Frequent Pain Assessment and Oral Care  Additional Recommendations (Limitations, Scope, Preferences):  Full Comfort Care  Psycho-social/Spiritual:   Desire for further Chaplaincy support:yes  Additional Recommendations: Education on Hospice  Prognosis:   Hours - Days in the setting of significant intra cerebral hemorrhage with vasogenic edema and 94mm Right to left midline shift.   Discharge Planning: To Be Determined. If stable, family would like residential hospice.      Primary Diagnoses: Present on Admission: **None**   I have reviewed the medical record, interviewed the patient and family, and examined the patient. The following aspects are pertinent.  Past Medical History:  Diagnosis Date  . Allergy   . Arthritis    "fingers and back" (06/23/2014)  . Basal cell carcinoma   . CHF (congestive heart failure) ([place])   . Chronic bronchitis ([place])    "lately" (06/23/2014)  . Chronic kidney disease (CKD), stage III (moderate)   . Dysrhythmia   . GERD (gastroesophageal reflux disease)   . H/O hiatal hernia   . Hyperlipidemia   . Hypertension   . Osteoporosis   . Paroxysmal A-fib ([place])   . Type II diabetes mellitus ([place])    Social History   Social History  . Marital status: Married  Spouse name: N/A  . Number of children: N/A  . Years of education: N/A   Occupational History  . Retired    Social History Main Topics  . Smoking status: Never Smoker  . Smokeless tobacco: Never Used  . Alcohol use No  . Drug use: No  . Sexual  activity: No   Other Topics Concern  . None   Social History Narrative   Lives in [place]. Daughter works in [employer] at [employer].    Family History  Problem Relation Age of Onset  . Tuberculosis Mother   . Heart disease Father        bad valve  . Heart attack Father   . Heart disease Brother        died at 54  . Heart attack Paternal Grandmother   . Heart attack Brother   . Hypertension Neg Hx   . Stroke Neg Hx    Scheduled Meds: .  morphine injection  2 mg Intravenous Once   And  . LORazepam  1 mg Intravenous Once   And  . glycopyrrolate  0.2 mg Intravenous Once   Continuous Infusions: . clevidipine Stopped (Aug 23, 2017 0848)  . morphine     PRN Meds:.acetaminophen, antiseptic oral rinse, glycopyrrolate, haloperidol lactate, LORazepam, morphine **AND** morphine, ondansetron (ZOFRAN) IV, polyvinyl alcohol Allergies  Allergen Reactions  . Levaquin [Levofloxacin] Hives, Itching and Other (See Comments)    insomnia  . Sulfa Antibiotics Rash  . Sulfacetamide Sodium Rash   Review of Systems  Unable to perform ROS  Physical Exam  Constitutional: No distress.  HENT:  Left scalp with scab and surrounding shaved area. Pt presently intubated  Eyes:  Pupils non-reactive  Cardiovascular: Normal rate and regular rhythm.   Pulmonary/Chest:  Intubated. Poor airflow in lung bases  Abdominal: Soft.  Musculoskeletal:  Not moving  Neurological:  Unresponsive  Skin: Skin is warm and dry. There is pallor.  Psychiatric:  UTA    Vital Signs: BP (!) 165/83   Pulse 65   Temp (!) 97.4 F (36.3 C) (Tympanic)   Resp 15   SpO2 100%  Pain Assessment:  (Pt unconsious )     SpO2: SpO2: 100 % O2 Device:SpO2: 100 % O2 Flow Rate: .   IO: Intake/output summary: No intake or output data in the 24 hours ending 08-23-17 1011  LBM:   Baseline Weight:   Most recent weight:       Palliative Assessment/Data: PPS 10%   Flowsheet Rows     Most Recent Value  Intake Tab  Referral  Department  -- [ER]  Unit at Time of Referral  ER  Palliative Care Primary Diagnosis  Neurology  Date Notified  08-23-17  Palliative Care Type  New Palliative care  Reason for referral  End of Life Care Assistance  Date of Admission  23-Aug-2017  Date first seen by Palliative Care  2017/08/23  # of days Palliative referral response time  0 Day(s)  # of days IP prior to Palliative referral  0  Clinical Assessment  Psychosocial & Spiritual Assessment  Palliative Care Outcomes  Patient/Family meeting held?  Yes  Who was at the meeting?  -- [3 children w their spouses]  Palliative Care Outcomes  Clarified goals of care, Counseled regarding hospice, Provided end of life care assistance, Changed to focus on comfort      Time Total: 70 minutes Greater than 50%  of this time was spent counseling and coordinating care related to the above assessment  and plan.  Signed by: Charlynn Court, NP Palliative Medicine Team Pager # [phone] (M-F 7a-5p) Team Phone # [phone] (Nights/Weekends)

## 2017-08-31 NOTE — ED Notes (Signed)
Respiratory at bedside for extubation. Palliative MD present at bedside. Family informed and with chaplain

## 2017-08-31 NOTE — ED Notes (Signed)
Pt has been extubated and suctioned. Family back at bedside. Pt has bed at [facility], care is being coordinated for pt to be discharged and transported to facility. Family aware

## 2017-08-31 NOTE — ED Notes (Signed)
Family at bedside. 

## 2017-08-31 NOTE — Progress Notes (Addendum)
Palliative Medicine RN Note: Rec'd call from ED MD. Pt is in trauma B. Currently on vent/ETT but family wants to extubate and pursue strict comfort care. Daughter works with rapid response team. Pt went to bed normal last night but had a massive head bleed overnight. Per family, pt has always been clear that she would not want aggressive care at EOL, and ETT was only placed to allow for CT.   Family requests that we prepare for extubation in ED, and they would like her to go straight to [facility]. Pt's husband died there 9 weeks ago today, and they have connections there, so that is the only place they want to go. Updated Dr Hilma Favors with PMT, who gave hospice rx; NP Charlynn Court will come place comfort orders  I contacted Cheryll Cockayne w [facility]; there are no beds at this moment, but they will contact me if something changes.   Marjie Skiff Jaydon Avina, RN, BSN, [facility] 08-18-17 9:16 AM Cell [phone] 8:00-4:00 Monday-Friday Office [phone]

## 2017-08-31 NOTE — Progress Notes (Signed)
Palliative Medicine RN Note:  Pt is relaxed and calm. No s/s distress, pain, shortness of breath. Completely non-responsive and not making respiratory effort. Initially, pt had no respirations but strong heart beat per ED MD. However, pt had no respirations, no HR at 1114 per ED MD, and pt was pronounced dead.  ED RN Hope was with pt and family, and she explained the post mortem process of allowing visitation and [facility] staff making necessary phone calls. Family is tearful but appreciative of support.  Marjie Skiff Welborn Keena, RN, BSN, [facility] 08/23/2017 11:40 AM Cell [phone] 8:00-4:00 Monday-Friday Office [phone]

## 2017-08-31 NOTE — ED Provider Notes (Signed)
[facility] DEPT Provider Note   CSN: [id number] Arrival date & time: 2017/09/09  0730     History   Chief Complaint Chief Complaint  Patient presents with  . Altered Mental Status    HPI Ariel Avila is a 83 y.o. female.  The history is provided by the EMS personnel, a relative and medical records.  Altered Mental Status   This is a new problem. The current episode started 12 to 24 hours ago. The problem has been rapidly worsening. Associated symptoms include unresponsiveness. Risk factors: recent diarrhea and abdominal pain. Her past medical history is significant for diabetes and hypertension. Her past medical history does not include head trauma.   Level V caveat for unresponsiveness  Past Medical History:  Diagnosis Date  . Allergy   . Arthritis    "fingers and back" (06/23/2014)  . Basal cell carcinoma   . CHF (congestive heart failure) ([place])   . Chronic bronchitis ([place])    "lately" (06/23/2014)  . Chronic kidney disease (CKD), stage III (moderate)   . Dysrhythmia   . GERD (gastroesophageal reflux disease)   . H/O hiatal hernia   . Hyperlipidemia   . Hypertension   . Osteoporosis   . Paroxysmal A-fib ([place])   . Type II diabetes mellitus [facility])     Patient Active Problem List   Diagnosis Date Noted  . Coronary artery calcification seen on CAT scan 07/07/2017  . On amiodarone therapy 02/21/2016  . Nausea vomiting and diarrhea 12/04/2014  . Hypoglycemia 12/03/2014  . CKD (chronic kidney disease) stage 3, GFR 30-59 ml/min 12/03/2014  . Hiatal hernia 12/03/2014  . GERD (gastroesophageal reflux disease) 12/03/2014  . Bronchitis, acute, with bronchospasm 06/23/2014  . Encounter for therapeutic drug monitoring 04/12/2014  . Paroxysmal atrial fibrillation ([place]) 11/18/2013  . Essential hypertension 11/18/2013  . Hyperlipidemia 11/18/2013  . Diabetes mellitus type 2, uncontrolled, with complications ([place]) 16/38/4665  . Hyperaldosteronism ([place]) 11/18/2013  .  Chronic diastolic heart failure ([place]) 11/18/2013    Class: Chronic    Past Surgical History:  Procedure Laterality Date  . ABDOMINAL HYSTERECTOMY    . APPENDECTOMY    . BASAL CELL CARCINOMA EXCISION     "face and nose"  . CARDIOVERSION N/A 10/24/2015   Procedure: CARDIOVERSION;  Surgeon: Pixie Casino, MD;  Location: [facility] ENDOSCOPY;  Service: Cardiovascular;  Laterality: N/A;    OB History    No data available       Home Medications    Prior to Admission medications   Medication Sig Start Date End Date Taking? Authorizing Provider  acetaminophen (TYLENOL) 500 MG tablet Take 500 mg by mouth every 6 (six) hours as needed for moderate pain.    [provider]  amiodarone (PACERONE) 200 MG tablet Take 1 tablet (200 mg total) by mouth daily. 07/22/17   Belva Crome, MD  cephALEXin (KEFLEX) 500 MG capsule Take 1 capsule (500 mg total) by mouth 4 (four) times daily. 07/07/17   Isla Pence, MD  cloNIDine (CATAPRES) 0.2 MG tablet Take 0.2 mg by mouth 2 (two) times daily. 0.3 mg am  and 0.2 mg pm    [provider]  desloratadine (CLARINEX) 5 MG tablet Take 5 mg by mouth daily.    [provider]  docusate sodium (COLACE) 100 MG capsule Take 1 capsule (100 mg total) by mouth every 12 (twelve) hours. 06/20/17   [place], Delice Bison, DO  ferrous sulfate 325 (65 FE) MG tablet Take 325 mg by mouth  2 (two) times daily.     [provider]  fluticasone (FLONASE) 50 MCG/ACT nasal spray Place 1 spray into both nostrils daily as needed for allergies or rhinitis.     [provider]  furosemide (LASIX) 20 MG tablet Take 40-80 mg by mouth every other day. Alternates between 40 and 80 mg every other day.    [provider]  glimepiride (AMARYL) 1 MG tablet Take 1 tablet (1 mg total) by mouth daily with breakfast. 12/06/14   Barton Dubois, MD  hydrALAZINE (APRESOLINE) 50 MG tablet Take 50 mg by mouth 3 (three) times daily.     [provider]    HYDROcodone-acetaminophen (NORCO/VICODIN) 5-325 MG tablet Take 1 tablet by mouth every 4 (four) hours as needed. 07/07/17   Isla Pence, MD  labetalol (NORMODYNE) 300 MG tablet Take 1 tablet (300 mg total) by mouth 2 (two) times daily. 05/29/17   Burtis Junes, NP  Lifitegrast Shirley Friar) 5 % SOLN Apply 1 drop to eye 2 (two) times daily.    [provider]  lovastatin (MEVACOR) 40 MG tablet Take 1 tablet (40 mg total) by mouth at bedtime. 01/05/14   Belva Crome, MD  Multiple Vitamin (MULTIVITAMIN WITH MINERALS) TABS tablet Take 1 tablet by mouth daily. One a Airline pilot, Historical, MD  Multiple Vitamins-Minerals (EYE VITAMINS PO) Take 1 capsule by mouth daily.    [provider]  Multiple Vitamins-Minerals (HAIR/SKIN/NAILS/BIOTIN) TABS Take 1 tablet by mouth daily.    [provider]  pantoprazole (PROTONIX) 40 MG tablet Take 40 mg by mouth daily.    [provider]  Polyvinyl Alcohol-Povidone (REFRESH OP) Place 1 drop into both eyes 2 (two) times daily as needed (for dry eyes).    [provider]  potassium chloride (K-DUR) 10 MEQ tablet Take 10 mEq by mouth daily.    [provider]  vitamin A 8000 UNIT capsule Take 8,000 Units by mouth daily.    [provider]  warfarin (COUMADIN) 2 MG tablet TAKE 1 TO 2 TABLETS BY MOUTH ONCE DAILY 08/05/17   Belva Crome, MD    Family History Family History  Problem Relation Age of Onset  . Tuberculosis Mother   . Heart disease Father        bad valve  . Heart attack Father   . Heart disease Brother        died at 8  . Heart attack Paternal Grandmother   . Heart attack Brother   . Hypertension Neg Hx   . Stroke Neg Hx     Social History Social History  Substance Use Topics  . Smoking status: Never Smoker  . Smokeless tobacco: Never Used  . Alcohol use No     Allergies   Levaquin [levofloxacin]; Sulfa antibiotics; and Sulfacetamide sodium   Review of  Systems Review of Systems  Unable to perform ROS: Patient unresponsive     Physical Exam Updated Vital Signs BP (!) 252/96 (BP Location: Right Arm)   Pulse 69   Temp (!) 97.4 F (36.3 C) (Tympanic)   Resp 17   SpO2 100%   Physical Exam  Constitutional: She appears well-developed and well-nourished. No distress.  HENT:  Head: Normocephalic.  Mouth/Throat: Oropharynx is clear and moist. No oropharyngeal exudate.  Eyes: Conjunctivae are normal. Right pupil is not reactive. Left pupil is not reactive.  Pupils are 4 mm and nonreactive bilaterally.  Cardiovascular: Bradycardia present.   No murmur  heard. Heart rate in the 40s through 60s.  Pulmonary/Chest: No stridor. Bradypnea noted. She is in respiratory distress.  Abdominal: Soft.  Neurological: She is unresponsive. GCS eye subscore is 1. GCS verbal subscore is 1. GCS motor subscore is 1.  Patient has GCS of 3 and is unresponsive.  Skin: Capillary refill takes less than 2 seconds. She is not diaphoretic.  Psychiatric: She has a normal mood and affect.  Nursing note and vitals reviewed.    ED Treatments / Results  Labs (all labs ordered are listed, but only abnormal results are displayed) Labs Reviewed  PROTIME-INR - Abnormal; Notable for the following:       Result Value   Prothrombin Time 27.8 (*)    All other components within normal limits  CBC WITH DIFFERENTIAL/PLATELET - Abnormal; Notable for the following:    Hemoglobin 11.3 (*)    HCT 34.1 (*)    All other components within normal limits  COMPREHENSIVE METABOLIC PANEL - Abnormal; Notable for the following:    Glucose, Bld 193 (*)    BUN 24 (*)    Creatinine, Ser 1.93 (*)    Calcium 8.4 (*)    Total Protein 5.6 (*)    Albumin 2.9 (*)    AST 69 (*)    Alkaline Phosphatase 132 (*)    GFR calc non Af Amer 23 (*)    GFR calc Af Amer 26 (*)    All other components within normal limits  I-STAT CHEM 8, ED - Abnormal; Notable for the following:    BUN 27 (*)     Creatinine, Ser 1.80 (*)    Glucose, Bld 193 (*)    Calcium, Ion 1.07 (*)    Hemoglobin 11.6 (*)    HCT 34.0 (*)    All other components within normal limits  I-STAT CG4 LACTIC ACID, ED  I-STAT TROPONIN, ED    EKG  EKG Interpretation None       Radiology Ct Head Wo Contrast  Result Date: 2017-09-10 CLINICAL DATA:  Unexplained altered level of consciousness EXAM: CT HEAD WITHOUT CONTRAST TECHNIQUE: Contiguous axial images were obtained from the base of the skull through the vertex without intravenous contrast. COMPARISON:  06/19/2017 FINDINGS: Brain: Large intra cerebral hemorrhage identified at the RIGHT basal ganglia measuring 7.3 x 5.2 x 5.9 cm in size. Significant RIGHT to LEFT midline shift approximately 14 mm. Significant surrounding vasogenic edema. Rupture of hemorrhage into the ventricular system with blood identified within the LEFT lateral, third, and fourth ventricles. Significant dilatation of the LEFT lateral ventricle likely representing entrapment/destruction. Diffuse cerebral edema with loss of cortical sulci and basilar cisterns. Protrusion cerebellar tonsils into the foramen magnum. No additional areas of infarction. No discrete mass. Vascular: Atherosclerotic calcification of internal carotid arteries at skullbase Skull: Intact Sinuses/Orbits: Clear Other: N/A IMPRESSION: Large intra cerebral hemorrhage at the RIGHT basal ganglia approximately 7.3 x 5.2 x 5.9 cm in size with surrounding vasogenic edema and 14 mm of RIGHT to LEFT midline shift. Significant intraventricular extension of hemorrhage. Extension of cerebellar tonsils into foramen magnum. Dilated LEFT lateral ventricle question entrapped/obstructed. Critical Value/emergent results were called by telephone at the time of interpretation on September 10, 2017 at 5:00 am to Dr. Roxanne Mins, who verbally acknowledged these results. Electronically Signed   By: Lavonia Dana M.D.   On: 09-10-17 08:14   Dg Chest Portable 1 View  Result  Date: September 10, 2017 CLINICAL DATA:  Patient was found unresponsive by the family this morning. The patient is intubated.  History of diabetes, atrial fibrillation, chronic CHF, chronic renal insufficiency. EXAM: PORTABLE CHEST 1 VIEW COMPARISON:  Portable chest x-ray of January 31, 2016 FINDINGS: The lungs are well-expanded. The interstitial markings are coarse. The cardiac silhouette is mildly enlarged. The central pulmonary vascularity is prominent. There is calcification in the wall of the aortic arch. The endotracheal tube tip lies 3.9 cm above the carina. The esophagogastric tube tip in proximal port project below the GE junction. IMPRESSION: CHF with mild interstitial edema. No acute pneumonia. Probable underlying chronic bronchitic change. The endotracheal tube is in reasonable position. Thoracic aortic atherosclerosis. Electronically Signed   By: David  [place] M.D.   On: September 01, 2017 07:59    Procedures Procedure Name: Intubation Date/Time: 2017/09/01 9:20 PM Performed by: Courtney Paris Pre-anesthesia Checklist: Patient identified, Emergency Drugs available, Timeout performed, Suction available and Patient being monitored Oxygen Delivery Method: Ambu bag Preoxygenation: Pre-oxygenation with 100% oxygen Induction Type: Rapid sequence Laryngoscope Size: Glidescope Grade View: Grade I Tube size: 7.5 mm Number of attempts: 1 Placement Confirmation: ETT inserted through vocal cords under direct vision,  Positive ETCO2 and Breath sounds checked- equal and bilateral Secured at: 21 cm Tube secured with: ETT holder Dental Injury: Teeth and Oropharynx as per pre-operative assessment       (including critical care time)  CRITICAL CARE Performed by: Gwenyth Allegra Tegeler Total critical care time: 45 minutes Critical care time was exclusive of separately billable procedures and treating other patients. Critical care was necessary to treat or prevent imminent or life-threatening  deterioration. Intracranial hemorrhage with Cleviprex administration for BP over 656 systolic. After DNR, coordination with palliative team, family, and PCP for further management.  Critical care was time spent personally by me on the following activities: development of treatment plan with patient and/or surrogate as well as nursing, discussions with consultants, evaluation of patient's response to treatment, examination of patient, obtaining history from patient or surrogate, ordering and performing treatments and interventions, ordering and review of laboratory studies, ordering and review of radiographic studies, pulse oximetry and re-evaluation of patient's condition.   Medications Ordered in ED Medications  clevidipine (CLEVIPREX) infusion 0.5 mg/mL (0 mg/hr Intravenous Stopped 2017/09/01 0848)  ondansetron (ZOFRAN) injection 4 mg (not administered)  LORazepam (ATIVAN) injection 1 mg (not administered)  acetaminophen (TYLENOL) suppository 650 mg (not administered)  haloperidol lactate (HALDOL) injection 0.5 mg (not administered)  glycopyrrolate (ROBINUL) injection 0.2 mg (not administered)  antiseptic oral rinse (BIOTENE) solution 15 mL (not administered)  polyvinyl alcohol (LIQUIFILM TEARS) 1.4 % ophthalmic solution 1 drop (not administered)  morphine 250 mg in sodium chloride 0.9 % 250 mL (1 mg/mL) infusion (not administered)    And  morphine bolus via infusion 4 mg (not administered)  etomidate (AMIDATE) injection (30 mg Intravenous Given 2017-09-01 0741)  rocuronium (ZEMURON) injection (100 mg Intravenous Given 09/01/17 0741)  morphine 4 MG/ML injection 2 mg (2 mg Intravenous Bolus 09/01/17 1033)    And  LORazepam (ATIVAN) injection 1 mg (1 mg Intravenous Given 09/01/2017 1032)    And  glycopyrrolate (ROBINUL) injection 0.2 mg (0.2 mg Intravenous Given Sep 01, 2017 1032)     Initial Impression / Assessment and Plan / ED Course  I have reviewed the triage vital signs and the nursing  notes.  Pertinent labs & imaging results that were available during my care of the patient were reviewed by me and considered in my medical decision making (see chart for details).     Ariel Avila is a 83 y.o. female with  a past medical history significant for atrial fibrillation on Coumadin, hypertension, diabetes, CHF, and CKD who presents with altered mental status and unresponsiveness. EMS were called by family when patient was found unresponsive this morning. Daughter provides the history and reports that patient was doing well yesterday aside from mild abdominal pain and diarrhea. She had a doctor's woman scheduled this morning and the daughter checked on her at 6:30 AM, she was unresponsive in bed. Patient had no evidence of traumatic injury. Patient has some skin tears on the right forearm that are chronic according to the daughter. Daughter reports that patient was in bed and nothing else was out of order at home.  On arrival, GCS was 3. Patient completely unresponsive to pain or stimulation. Pupils are 4 mm and nonreactive bilaterally.  Daughter was quickly called and agreed with intubation to allow for further diagnostic workup. Next  Patient was intubated with RSI. Post intubation x-ray confirmed tube placement and no evidence of pneumonia or pneumothorax.  Family arrived and felt that patient should be made DO NOT RESUSCITATE. Patient had screening laboratory testing sent.  Patient empirically started on Cleviprex to treat the possible intracranial hemorrhage given the mild bradycardia and severe hypertension.  CT scan confirmed massive intracranial hemorrhage.   Family continues to request DO NOT RESUSCITATE status. Family is determining who they want to come see the patient before they extubate and withdraw care.  Family of the patient was able to arrive and spent time with the patient. They felt that they are ready for withdrawal of care and extubation. Patient had the  Cleviprex discontinued and her blood pressure began to rise. Patient was extubated without difficulty. Palliative care team was consult and placed orders for comfort.  Initially, decision was to try and transfer patient to begin place for palliative management however, patient stopped breathing after extubation. Decision made to allow patient to continue care in the emergency department while awaiting expiration.   Nursing called to request examination by this physician. Patient stopped breathing and no longer had a pulse. Patient was examined and did not have a pulse for approximately 30 seconds of my evaluation. Patient had no reflexes and was not breathing.    11:18 AM Time of death is 11:14AM.   Family was at bedside at time of death. PCP called and will fill out death certificate.        Final Clinical Impressions(s) / ED Diagnoses   Final diagnoses:  Other right-sided nontraumatic intracerebral hemorrhage ([place])  Unresponsive  Death    Clinical Impression: 1. Other right-sided nontraumatic intracerebral hemorrhage ([place])   2. Unresponsive   3. Death     Disposition: Expired    Tegeler, Gwenyth Allegra, MD 08/22/17 2124

## 2017-08-31 NOTE — Sedation Documentation (Signed)
Intubation. 21 at the lip. Positive color change

## 2017-08-31 NOTE — Progress Notes (Signed)
Consulted on patient for comfort care but patient expired prior to my exam. EDP had pronounced patient dead.  Elwin Mocha MD

## 2017-08-31 NOTE — ED Triage Notes (Signed)
Pt found unresponsive by family. BP 270/110, resp 14, pt on coumadin. EMS gave 2 mg narcan. No response. Pt on nonrebreather from EMS

## 2017-08-31 NOTE — ED Notes (Signed)
Time of death 23. Dr. Sherry Ruffing pronounced time of death with family at bedside

## 2017-08-31 NOTE — ED Notes (Signed)
Palliative MD and RN at bedside with family

## 2017-08-31 NOTE — Procedures (Signed)
Extubation Procedure Note  Patient Details:   Name: ASUCENA GALER DOB: 07/05/1930 MRN: [id number]   Airway Documentation:  Airway 7.5 mm (Active)  Secured at (cm) 21 cm 2017/09/05  8:00 AM    Evaluation  O2 sats: currently acceptable Complications: No apparent complications Patient did tolerate Bilateral Breath Sounds: Clear   No   Pt terminally extubated to 2L [place].  Pt is a DNR.  RN and family at bedside.  RT will continue to monitor.  Pierre Bali 2017-09-05, 10:55 AM

## 2017-08-31 NOTE — ED Notes (Signed)
Morphine 250mg /94mL drip wasted in sink with Levander Campion and this RN.

## 2017-08-31 DEATH — deceased

## 2018-11-03 IMAGING — DX DG KNEE COMPLETE 4+V*L*
4 series · 4 of 4 positions shown · non-contrast
Comparison: None.

CLINICAL DATA: Fall with knee pain

EXAM:
LEFT KNEE - COMPLETE 4+ VIEW

[knee ap]
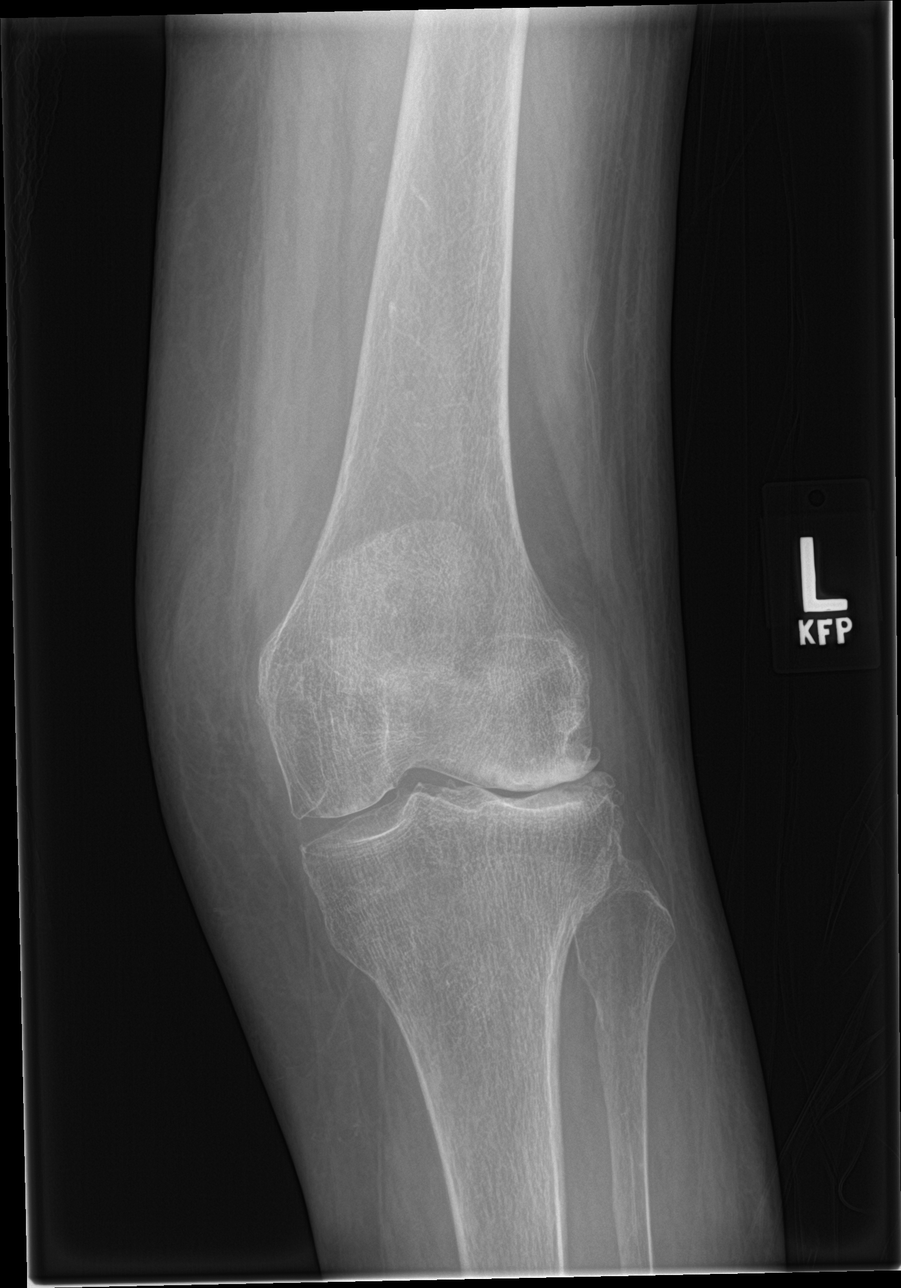

[knee obl (1 of 2)]
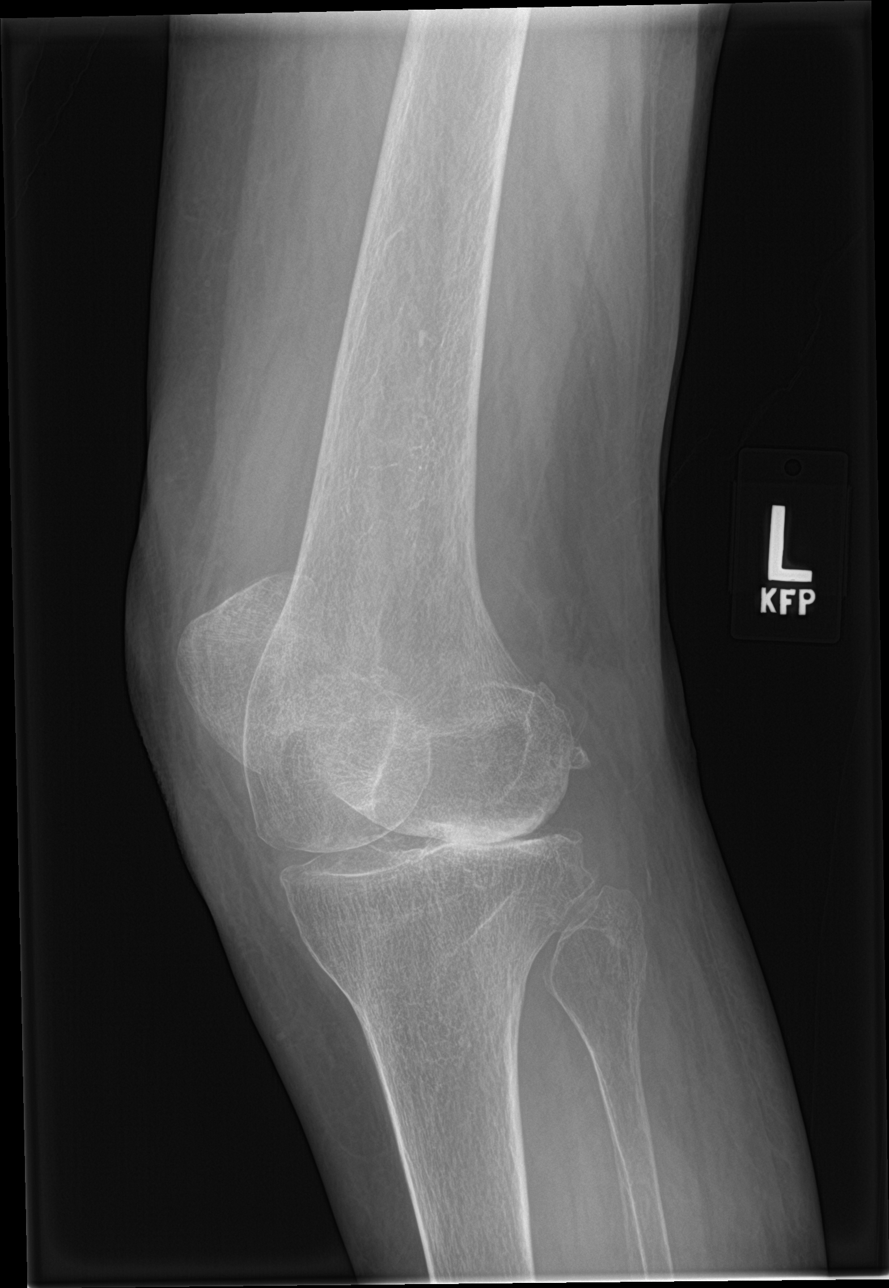

[knee obl (2 of 2)]
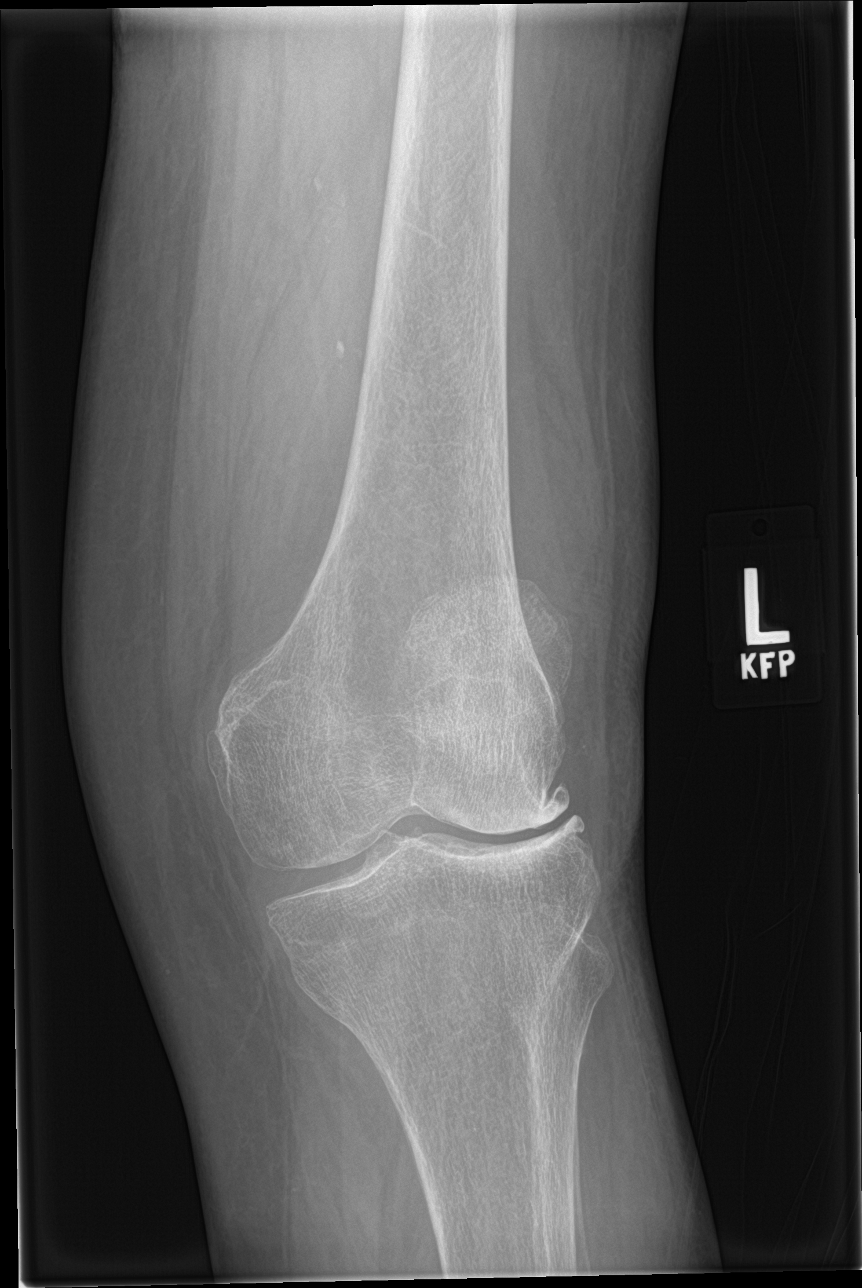

[knee lat]
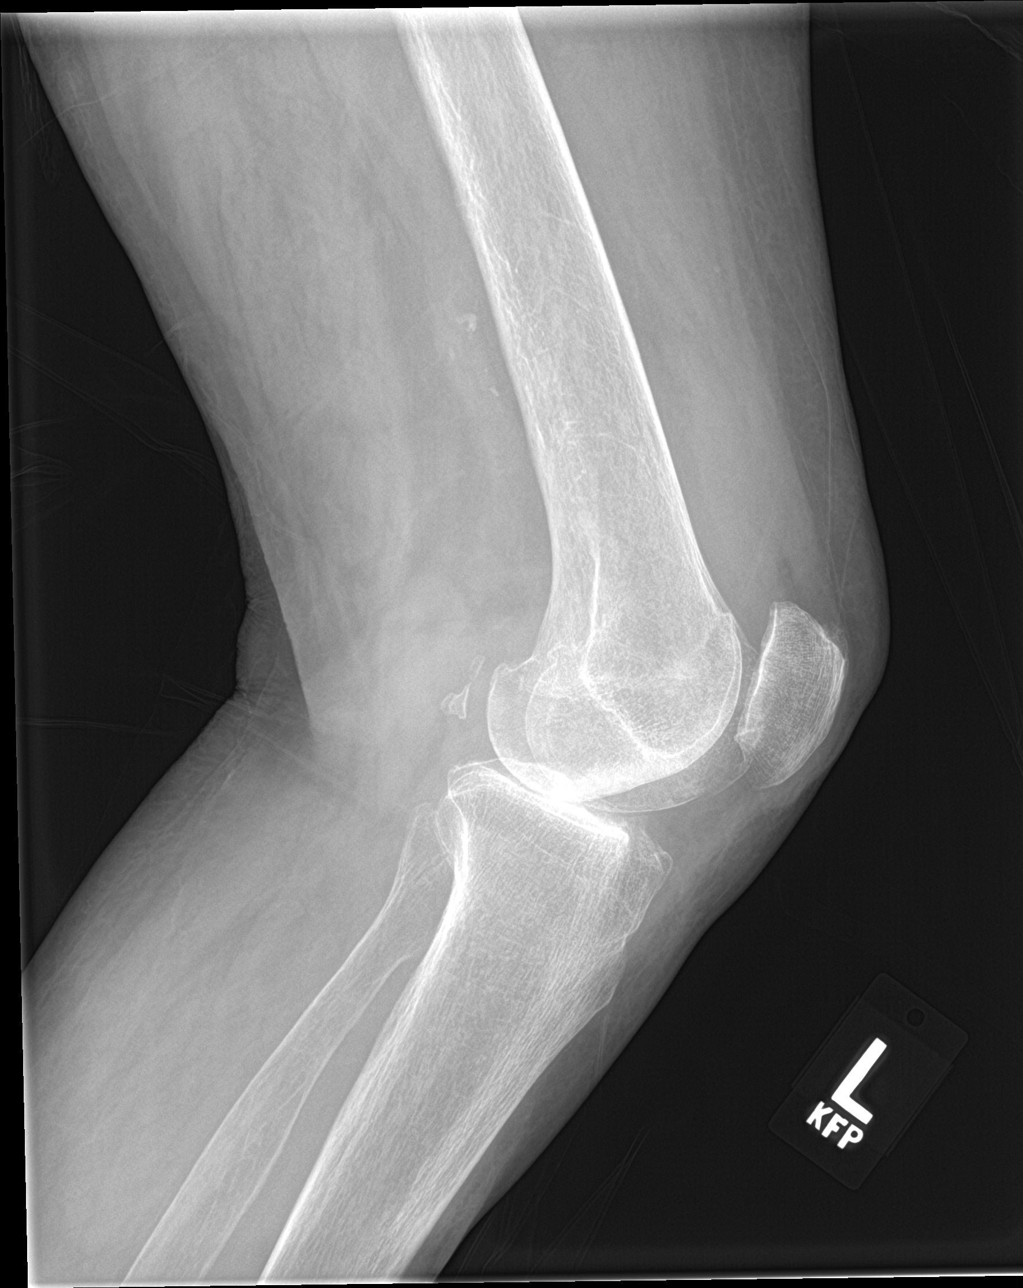

[4 of 4 positions shown; findings below may reference images not displayed]

FINDINGS: There is narrowing of the lateral femorotibial compartment with
associated marginal osteophytosis. There is no acute fracture or
dislocation. Small knee effusion.
IMPRESSION: Small knee effusion without acute osseous abnormality. Moderate
lateral compartment osteoarthrosis.

## 2018-11-03 IMAGING — CT CT CERVICAL SPINE W/O CM
4 of 7 series · 13 of 33 positions shown, 14 images · non-contrast
Comparison: Brain MRI dated 06/15/2016

CLINICAL DATA: 87-year-old female with fall and trauma to the head.

EXAM:
CT HEAD WITHOUT CONTRAST
CT CERVICAL SPINE WITHOUT CONTRAST
TECHNIQUE: Multidetector CT imaging of the head and cervical spine was
performed following the standard protocol without intravenous
contrast. Multiplanar CT image reconstructions of the cervical spine
were also generated.

[Series 6: head without cor · coronal · non-contrast · 0.31mm/px · 3 of 67 slices shown]
[im 14/67  bone]
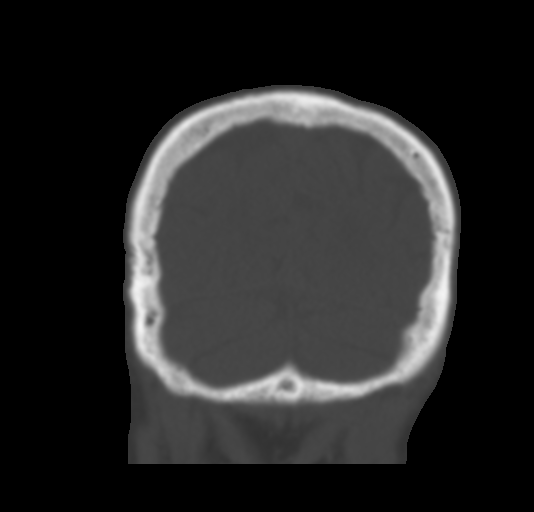
[im 27/67  bone]
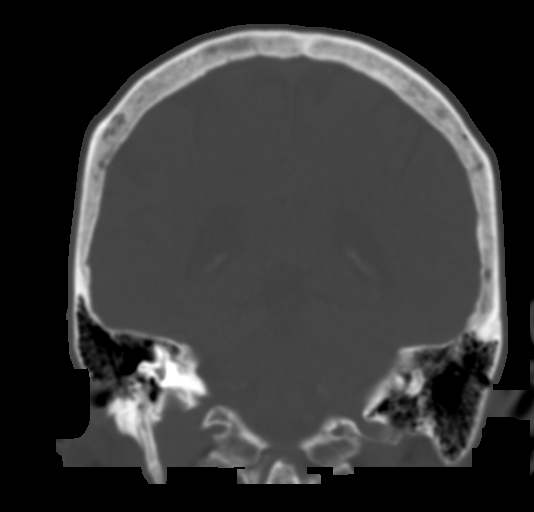
[im 40/67  bone]
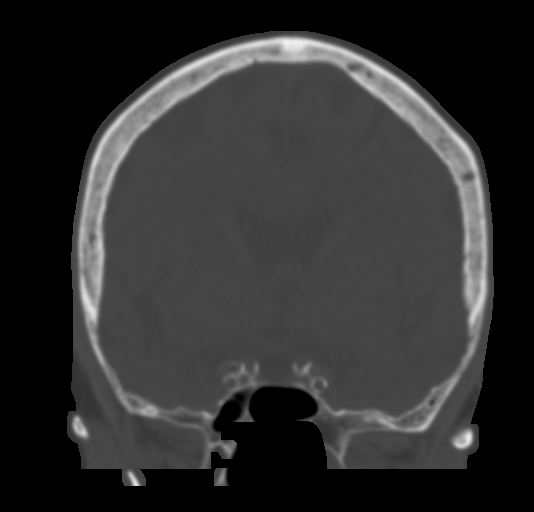

[Series 8: c_spine 2.0 st · axial · 0.38mm/px · z∈[-212,-148]mm · 2 of 96 slices shown, 3 images]
[im 32/96  soft-tissue]
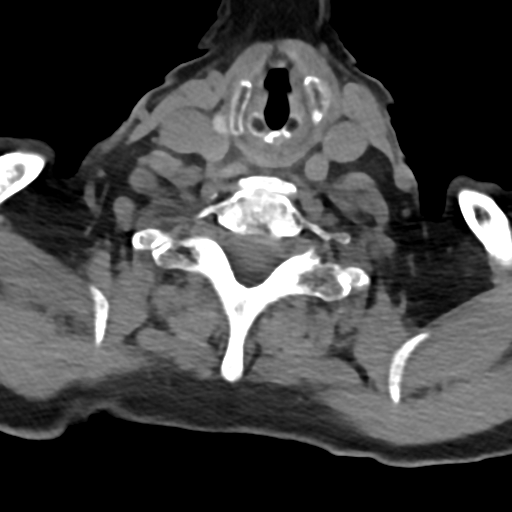
[im 32/96  bone]
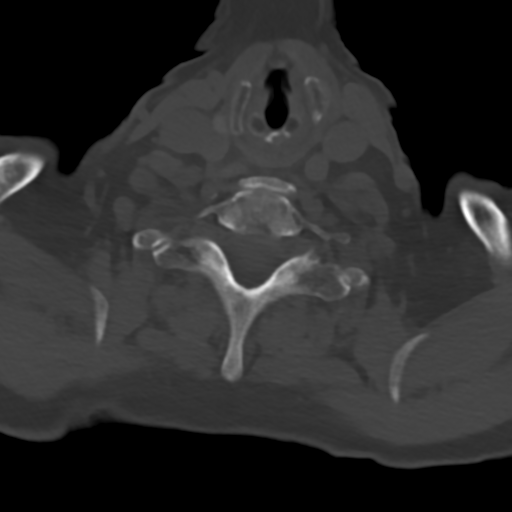
[im 64/96  bone]
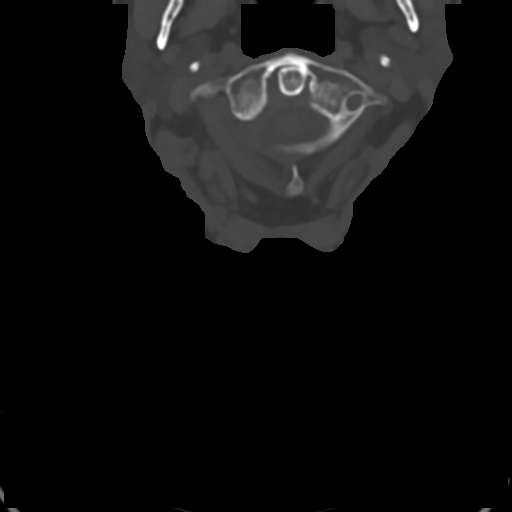

[Series 12: c_spine 2.0 sag bone · sagittal · 0.23mm/px · 5 of 61 slices shown]
[im 11/61  bone]
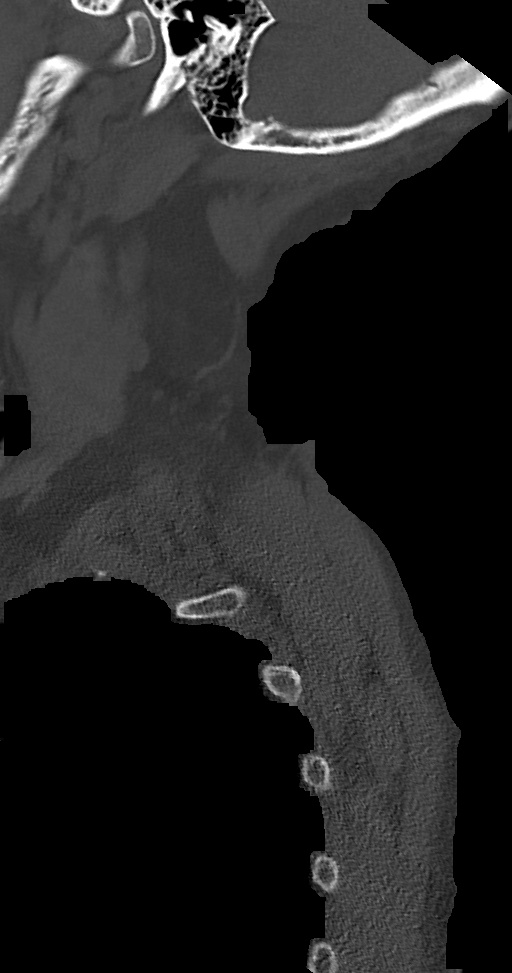
[im 21/61  bone]
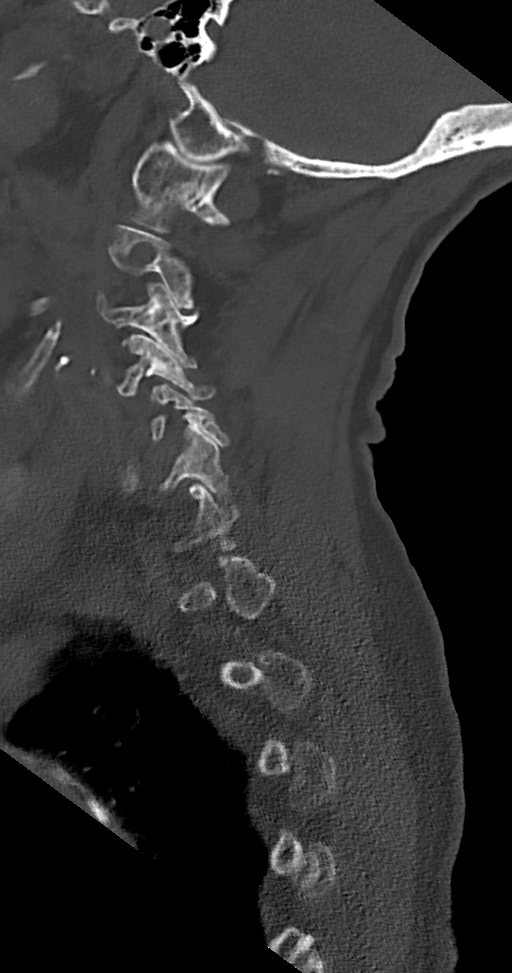
[im 31/61  bone]
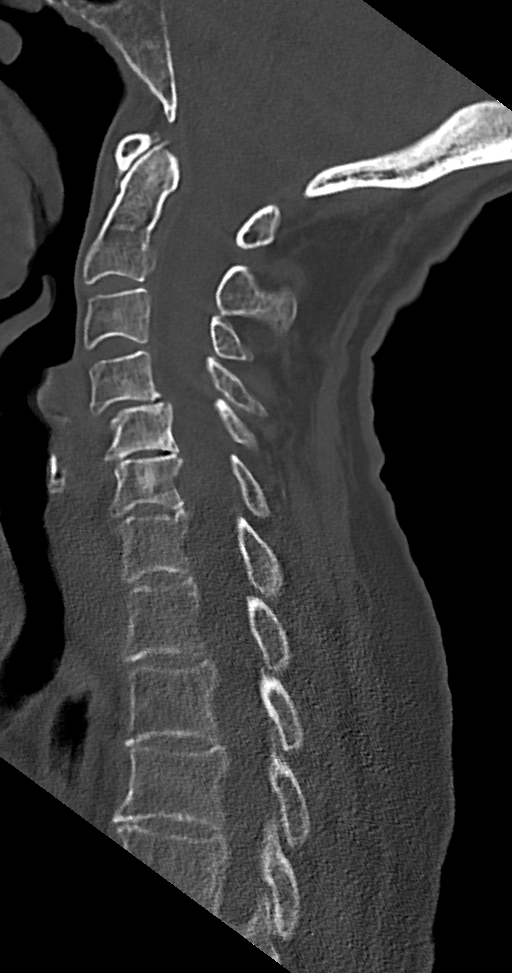
[im 41/61  bone]
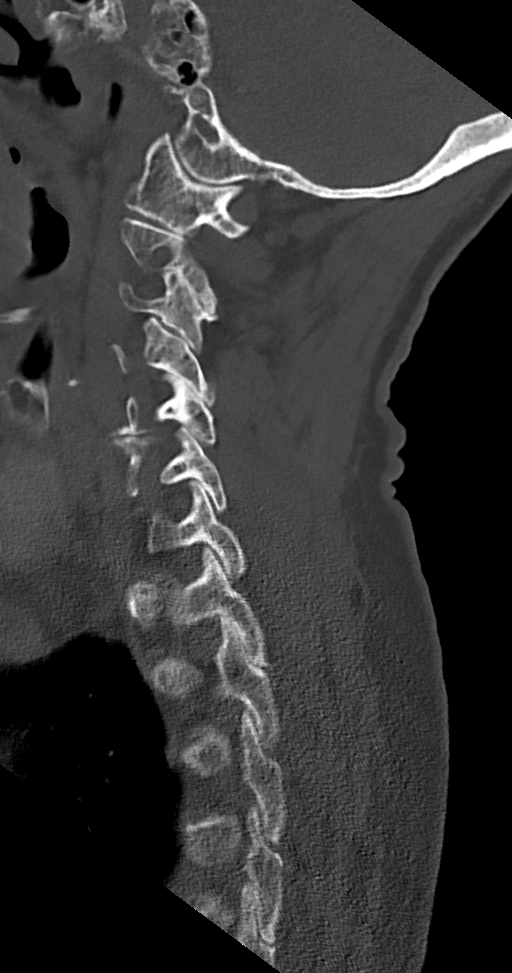
[im 51/61  bone]
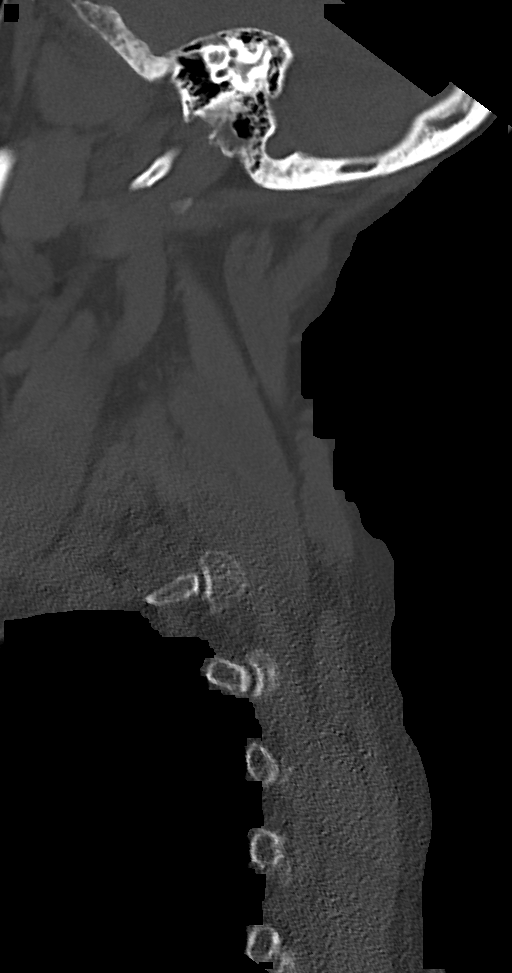

[Series 14: c_spine 2.0 orthogonals · axial · 0.21mm/px · z∈[-247,-179]mm · 3 of 98 slices shown]
[im 25/98  bone]
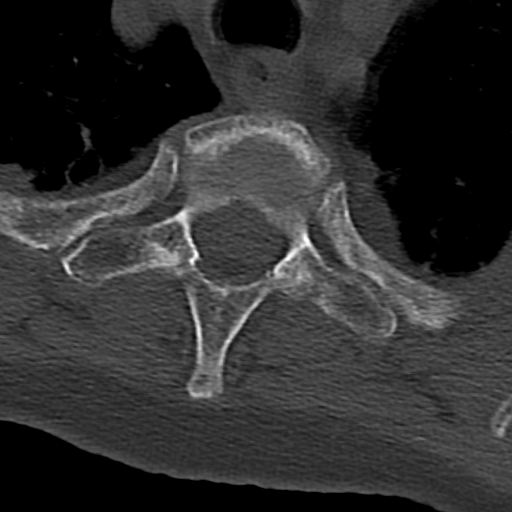
[im 49/98  bone]
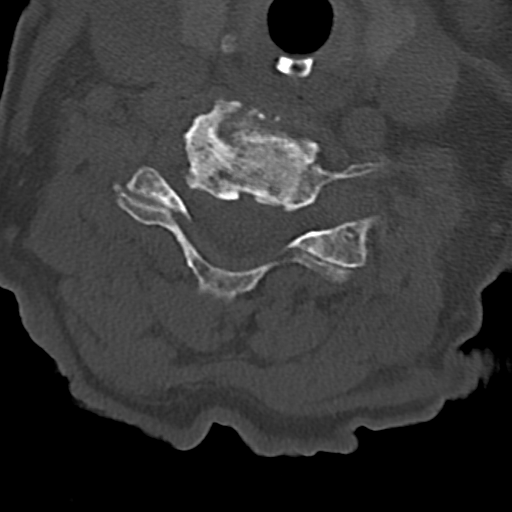
[im 73/98  bone]
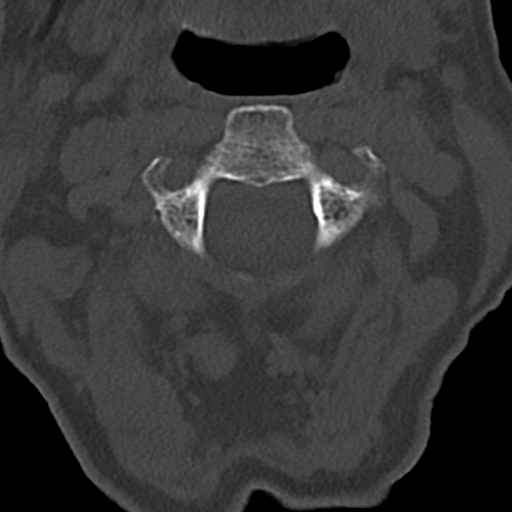

[13 of 33 positions shown; findings below may reference images not displayed]

FINDINGS: CT HEAD FINDINGS

Brain: The ventricles and sulci appropriate size for the patient's
age. Mild periventricular and deep white matter chronic
microvascular ischemic changes noted. There is no acute intracranial
hemorrhage. No mass effect or midline shift noted. No intra-axial
fluid collection.

Vascular: No hyperdense vessel or unexpected calcification.

Skull: Normal. Negative for fracture or focal lesion.

Sinuses/Orbits: Mild mucoperiosteal thickening of paranasal sinuses.
No air-fluid levels. The mastoid air cells are clear.

Other: None

CT CERVICAL SPINE FINDINGS

Alignment: No acute subluxation.

Skull base and vertebrae: No acute fracture. A 7 x 8 mm sclerotic
lesion in the C7 vertebra is not well characterized but may
represent a bone island. Metastatic disease is not entirely
excluded. Correlation with history of malignancy recommended. MRI
may provide better evaluation of this lesion.

Soft tissues and spinal canal: No prevertebral fluid or swelling. No
visible canal hematoma.

Disc levels: There multilevel degenerative changes with disc space
narrowing and endplate irregularity most prominent at C5-C6. There
is grade 1 C4-C5 anterolisthesis.

Upper chest: The visualized lungs are clear. There is
atherosclerotic calcification of the aortic arch. There is a 1 cm
right thyroid hypodense nodule. Ultrasound may provide better
evaluation. Bilateral carotid bulb calcified plaques noted.

Other: None
IMPRESSION: 1. No acute intracranial hemorrhage. Mild age-related atrophy and
chronic microvascular ischemic changes.
2. No acute/traumatic cervical spine pathology. Multilevel
degenerative changes.
3. Indeterminate C6 Sclerotic lesion, possibly a bone island.
Metastatic disease is not entirely excluded. Correlation with
history of cancer recommended. MRI may provide better evaluation of
this lesion.
